# Patient Record
Sex: Female | Born: 1975 | Race: White | Hispanic: No | Marital: Single | Smoking: Former smoker
Health system: Southern US, Community
[De-identification: ages and names within clinical notes are randomized; demographics above are authoritative.]

## PROBLEM LIST (undated history)

## (undated) DIAGNOSIS — J45909 Unspecified asthma, uncomplicated: Secondary | ICD-10-CM

## (undated) DIAGNOSIS — J309 Allergic rhinitis, unspecified: Secondary | ICD-10-CM

## (undated) DIAGNOSIS — F329 Major depressive disorder, single episode, unspecified: Secondary | ICD-10-CM

## (undated) DIAGNOSIS — F988 Other specified behavioral and emotional disorders with onset usually occurring in childhood and adolescence: Secondary | ICD-10-CM

## (undated) DIAGNOSIS — D649 Anemia, unspecified: Secondary | ICD-10-CM

## (undated) DIAGNOSIS — R1900 Intra-abdominal and pelvic swelling, mass and lump, unspecified site: Secondary | ICD-10-CM

## (undated) DIAGNOSIS — E785 Hyperlipidemia, unspecified: Secondary | ICD-10-CM

## (undated) DIAGNOSIS — F32A Depression, unspecified: Secondary | ICD-10-CM

## (undated) DIAGNOSIS — Z9889 Other specified postprocedural states: Secondary | ICD-10-CM

## (undated) DIAGNOSIS — D509 Iron deficiency anemia, unspecified: Secondary | ICD-10-CM

## (undated) DIAGNOSIS — J4530 Mild persistent asthma, uncomplicated: Secondary | ICD-10-CM

## (undated) DIAGNOSIS — F411 Generalized anxiety disorder: Secondary | ICD-10-CM

## (undated) DIAGNOSIS — D126 Benign neoplasm of colon, unspecified: Secondary | ICD-10-CM

## (undated) DIAGNOSIS — R102 Pelvic and perineal pain unspecified side: Secondary | ICD-10-CM

## (undated) DIAGNOSIS — D259 Leiomyoma of uterus, unspecified: Secondary | ICD-10-CM

## (undated) DIAGNOSIS — Z8659 Personal history of other mental and behavioral disorders: Secondary | ICD-10-CM

## (undated) DIAGNOSIS — R112 Nausea with vomiting, unspecified: Secondary | ICD-10-CM

## (undated) DIAGNOSIS — K449 Diaphragmatic hernia without obstruction or gangrene: Secondary | ICD-10-CM

## (undated) DIAGNOSIS — M1990 Unspecified osteoarthritis, unspecified site: Secondary | ICD-10-CM

## (undated) DIAGNOSIS — F419 Anxiety disorder, unspecified: Secondary | ICD-10-CM

## (undated) DIAGNOSIS — K219 Gastro-esophageal reflux disease without esophagitis: Secondary | ICD-10-CM

## (undated) DIAGNOSIS — T7840XA Allergy, unspecified, initial encounter: Secondary | ICD-10-CM

## (undated) DIAGNOSIS — N939 Abnormal uterine and vaginal bleeding, unspecified: Secondary | ICD-10-CM

## (undated) DIAGNOSIS — N83201 Unspecified ovarian cyst, right side: Secondary | ICD-10-CM

## (undated) DIAGNOSIS — Z973 Presence of spectacles and contact lenses: Secondary | ICD-10-CM

## (undated) HISTORY — DX: Anemia, unspecified: D64.9

## (undated) HISTORY — PX: TONSILLECTOMY: SUR1361

## (undated) HISTORY — DX: Iron deficiency anemia, unspecified: D50.9

## (undated) HISTORY — DX: Allergy, unspecified, initial encounter: T78.40XA

## (undated) HISTORY — DX: Unspecified asthma, uncomplicated: J45.909

## (undated) HISTORY — DX: Benign neoplasm of colon, unspecified: D12.6

## (undated) HISTORY — DX: Gastro-esophageal reflux disease without esophagitis: K21.9

## (undated) HISTORY — DX: Nausea with vomiting, unspecified: R11.2

## (undated) HISTORY — DX: Other specified behavioral and emotional disorders with onset usually occurring in childhood and adolescence: F98.8

## (undated) HISTORY — PX: BREAST REDUCTION SURGERY: SHX8

## (undated) HISTORY — DX: Depression, unspecified: F32.A

## (undated) HISTORY — PX: EYE SURGERY: SHX253

## (undated) HISTORY — DX: Other specified postprocedural states: Z98.890

## (undated) HISTORY — DX: Allergic rhinitis, unspecified: J30.9

## (undated) HISTORY — PX: LEEP: SHX91

## (undated) HISTORY — DX: Anxiety disorder, unspecified: F41.9

## (undated) HISTORY — PX: REDUCTION MAMMAPLASTY: SUR839

---
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
## 1994-05-16 HISTORY — PX: TONSILLECTOMY: SUR1361

## 1995-05-17 HISTORY — PX: CERVICAL BIOPSY  W/ LOOP ELECTRODE EXCISION: SUR135

## 2002-09-03 ENCOUNTER — Other Ambulatory Visit: Admission: RE | Admit: 2002-09-03 | Discharge: 2002-09-03 | Payer: Self-pay | Admitting: *Deleted

## 2003-12-12 ENCOUNTER — Emergency Department (HOSPITAL_COMMUNITY): Admission: EM | Admit: 2003-12-12 | Discharge: 2003-12-12 | Payer: Self-pay | Admitting: Family Medicine

## 2004-01-06 ENCOUNTER — Encounter: Admission: RE | Admit: 2004-01-06 | Discharge: 2004-01-06 | Payer: Self-pay | Admitting: Internal Medicine

## 2004-06-09 ENCOUNTER — Ambulatory Visit: Payer: Self-pay | Admitting: Internal Medicine

## 2004-07-05 ENCOUNTER — Ambulatory Visit: Payer: Self-pay | Admitting: Internal Medicine

## 2004-07-19 ENCOUNTER — Ambulatory Visit: Payer: Self-pay | Admitting: Internal Medicine

## 2004-09-09 ENCOUNTER — Ambulatory Visit: Payer: Self-pay | Admitting: Internal Medicine

## 2004-12-09 ENCOUNTER — Ambulatory Visit: Payer: Self-pay | Admitting: Internal Medicine

## 2005-02-10 ENCOUNTER — Ambulatory Visit: Payer: Self-pay | Admitting: Internal Medicine

## 2005-03-17 ENCOUNTER — Ambulatory Visit: Payer: Self-pay | Admitting: Internal Medicine

## 2005-04-01 ENCOUNTER — Ambulatory Visit: Payer: Self-pay | Admitting: Internal Medicine

## 2005-06-07 ENCOUNTER — Ambulatory Visit: Payer: Self-pay | Admitting: Internal Medicine

## 2005-08-15 ENCOUNTER — Ambulatory Visit: Payer: Self-pay | Admitting: Internal Medicine

## 2005-09-15 ENCOUNTER — Ambulatory Visit: Payer: Self-pay | Admitting: Internal Medicine

## 2005-12-14 ENCOUNTER — Ambulatory Visit: Payer: Self-pay | Admitting: Internal Medicine

## 2006-02-17 ENCOUNTER — Ambulatory Visit: Payer: Self-pay | Admitting: Internal Medicine

## 2006-04-21 ENCOUNTER — Ambulatory Visit: Payer: Self-pay | Admitting: Internal Medicine

## 2006-05-24 ENCOUNTER — Ambulatory Visit: Payer: Self-pay | Admitting: Internal Medicine

## 2006-08-31 ENCOUNTER — Ambulatory Visit: Payer: Self-pay | Admitting: Internal Medicine

## 2006-10-23 ENCOUNTER — Ambulatory Visit: Payer: Self-pay | Admitting: Internal Medicine

## 2006-12-27 ENCOUNTER — Telehealth (INDEPENDENT_AMBULATORY_CARE_PROVIDER_SITE_OTHER): Payer: Self-pay | Admitting: *Deleted

## 2006-12-29 ENCOUNTER — Telehealth: Payer: Self-pay | Admitting: Internal Medicine

## 2007-02-01 ENCOUNTER — Ambulatory Visit: Payer: Self-pay | Admitting: Internal Medicine

## 2007-02-01 LAB — CONVERTED CEMR LAB: pH: 5.5

## 2007-02-02 DIAGNOSIS — J309 Allergic rhinitis, unspecified: Secondary | ICD-10-CM

## 2007-02-02 DIAGNOSIS — J45909 Unspecified asthma, uncomplicated: Secondary | ICD-10-CM

## 2007-02-02 HISTORY — DX: Allergic rhinitis, unspecified: J30.9

## 2007-02-02 HISTORY — DX: Unspecified asthma, uncomplicated: J45.909

## 2007-02-13 ENCOUNTER — Ambulatory Visit: Payer: Self-pay | Admitting: Internal Medicine

## 2007-03-12 ENCOUNTER — Ambulatory Visit: Payer: Self-pay | Admitting: Internal Medicine

## 2007-03-30 ENCOUNTER — Telehealth: Payer: Self-pay | Admitting: Internal Medicine

## 2007-04-24 ENCOUNTER — Ambulatory Visit: Payer: Self-pay | Admitting: Internal Medicine

## 2007-04-24 LAB — CONVERTED CEMR LAB: Rapid Strep: NEGATIVE

## 2007-04-30 ENCOUNTER — Telehealth: Payer: Self-pay | Admitting: Internal Medicine

## 2007-05-14 ENCOUNTER — Ambulatory Visit: Payer: Self-pay | Admitting: Internal Medicine

## 2007-05-14 DIAGNOSIS — K219 Gastro-esophageal reflux disease without esophagitis: Secondary | ICD-10-CM

## 2007-05-14 DIAGNOSIS — F988 Other specified behavioral and emotional disorders with onset usually occurring in childhood and adolescence: Secondary | ICD-10-CM

## 2007-05-14 HISTORY — DX: Other specified behavioral and emotional disorders with onset usually occurring in childhood and adolescence: F98.8

## 2007-05-14 HISTORY — DX: Gastro-esophageal reflux disease without esophagitis: K21.9

## 2007-07-02 ENCOUNTER — Telehealth: Payer: Self-pay | Admitting: Internal Medicine

## 2007-07-12 ENCOUNTER — Telehealth: Payer: Self-pay | Admitting: Internal Medicine

## 2007-07-13 ENCOUNTER — Ambulatory Visit: Payer: Self-pay | Admitting: Internal Medicine

## 2007-07-19 ENCOUNTER — Ambulatory Visit: Payer: Self-pay | Admitting: Internal Medicine

## 2007-08-30 ENCOUNTER — Ambulatory Visit: Payer: Self-pay | Admitting: Internal Medicine

## 2007-09-11 ENCOUNTER — Telehealth: Payer: Self-pay | Admitting: Internal Medicine

## 2007-10-15 ENCOUNTER — Telehealth: Payer: Self-pay | Admitting: *Deleted

## 2007-11-29 ENCOUNTER — Ambulatory Visit: Payer: Self-pay | Admitting: Internal Medicine

## 2007-12-31 ENCOUNTER — Ambulatory Visit: Payer: Self-pay | Admitting: Internal Medicine

## 2007-12-31 ENCOUNTER — Telehealth: Payer: Self-pay | Admitting: Internal Medicine

## 2008-01-05 ENCOUNTER — Ambulatory Visit: Payer: Self-pay | Admitting: Family Medicine

## 2008-01-22 ENCOUNTER — Telehealth: Payer: Self-pay | Admitting: Internal Medicine

## 2008-02-06 ENCOUNTER — Ambulatory Visit: Payer: Self-pay | Admitting: Internal Medicine

## 2008-02-14 ENCOUNTER — Ambulatory Visit: Payer: Self-pay | Admitting: Internal Medicine

## 2008-03-13 ENCOUNTER — Ambulatory Visit: Payer: Self-pay | Admitting: Internal Medicine

## 2008-03-18 ENCOUNTER — Telehealth: Payer: Self-pay | Admitting: Internal Medicine

## 2008-03-19 ENCOUNTER — Telehealth: Payer: Self-pay | Admitting: Internal Medicine

## 2008-03-24 ENCOUNTER — Telehealth: Payer: Self-pay | Admitting: Internal Medicine

## 2008-04-28 ENCOUNTER — Telehealth: Payer: Self-pay | Admitting: Internal Medicine

## 2008-05-02 ENCOUNTER — Ambulatory Visit: Payer: Self-pay | Admitting: Internal Medicine

## 2008-05-15 ENCOUNTER — Telehealth: Payer: Self-pay | Admitting: *Deleted

## 2008-06-04 ENCOUNTER — Telehealth: Payer: Self-pay | Admitting: Internal Medicine

## 2008-07-04 ENCOUNTER — Ambulatory Visit: Payer: Self-pay | Admitting: Internal Medicine

## 2008-10-14 ENCOUNTER — Telehealth: Payer: Self-pay | Admitting: Internal Medicine

## 2009-02-10 ENCOUNTER — Telehealth: Payer: Self-pay | Admitting: Internal Medicine

## 2009-02-24 ENCOUNTER — Telehealth: Payer: Self-pay | Admitting: Internal Medicine

## 2009-03-31 ENCOUNTER — Telehealth: Payer: Self-pay | Admitting: Internal Medicine

## 2009-05-15 ENCOUNTER — Ambulatory Visit: Payer: Self-pay | Admitting: Family Medicine

## 2009-06-18 ENCOUNTER — Telehealth: Payer: Self-pay | Admitting: Internal Medicine

## 2009-07-22 ENCOUNTER — Encounter: Admission: RE | Admit: 2009-07-22 | Discharge: 2009-07-22 | Payer: Self-pay | Admitting: Gynecology

## 2009-09-24 ENCOUNTER — Telehealth: Payer: Self-pay | Admitting: Internal Medicine

## 2009-12-11 ENCOUNTER — Telehealth: Payer: Self-pay | Admitting: Internal Medicine

## 2009-12-21 ENCOUNTER — Telehealth: Payer: Self-pay | Admitting: Internal Medicine

## 2010-01-15 ENCOUNTER — Telehealth: Payer: Self-pay | Admitting: Internal Medicine

## 2010-02-01 ENCOUNTER — Encounter: Payer: Self-pay | Admitting: Internal Medicine

## 2010-04-27 ENCOUNTER — Telehealth: Payer: Self-pay | Admitting: Internal Medicine

## 2010-06-03 ENCOUNTER — Encounter: Payer: Self-pay | Admitting: *Deleted

## 2010-06-04 ENCOUNTER — Encounter: Payer: Self-pay | Admitting: Internal Medicine

## 2010-06-04 ENCOUNTER — Ambulatory Visit
Admission: RE | Admit: 2010-06-04 | Discharge: 2010-06-04 | Payer: Self-pay | Source: Home / Self Care | Attending: Internal Medicine | Admitting: Internal Medicine

## 2010-06-10 ENCOUNTER — Telehealth: Payer: Self-pay | Admitting: Internal Medicine

## 2010-06-17 NOTE — Progress Notes (Signed)
Summary: REQ FOR REFILL (Ritalin)  Phone Note Refill Request Message from:  Patient on Sep 24, 2009 4:44 PM  Refills Requested: Medication #1:  RITALIN SR 20 MG  TBCR one by mouth q AM   Notes: Pt can be reached at [phone] when Rx is ready for p/u.  Medication #2:  RITALIN 10 MG  TABS one by mouth at noon   Notes: Pt can be reached at [phone] when Rx is ready for p/u.    Initial call taken by: Debbra Riding,  Sep 24, 2009 4:45 PM    Prescriptions: RITALIN 10 MG  TABS (METHYLPHENIDATE HCL) one by mouth at noon  #30 x 0   Entered by:   Willy Eddy, LPN   Authorized by:   Stacie Glaze MD   Signed by:   Willy Eddy, LPN on 29/56/2130   Method used:   Print then Give to Patient   RxID:   [id number] RITALIN SR 20 MG  TBCR (METHYLPHENIDATE HCL) one by mouth q AM  #30 x 0   Entered by:   Willy Eddy, LPN   Authorized by:   Stacie Glaze MD   Signed by:   Willy Eddy, LPN on 32/44/0102   Method used:   Print then Give to Patient   RxID:   [id number] RITALIN 10 MG  TABS (METHYLPHENIDATE HCL) one by mouth at noon  #30 x 0   Entered by:   Willy Eddy, LPN   Authorized by:   Stacie Glaze MD   Signed by:   Willy Eddy, LPN on 56/38/7564   Method used:   Print then Give to Patient   RxID:   [id number] RITALIN SR 20 MG  TBCR (METHYLPHENIDATE HCL) one by mouth q AM  #30 x 0   Entered by:   Willy Eddy, LPN   Authorized by:   Stacie Glaze MD   Signed by:   Willy Eddy, LPN on 16/05/930   Method used:   Print then Give to Patient   RxID:   [id number] RITALIN SR 20 MG  TBCR (METHYLPHENIDATE HCL) one by mouth q AM  #30 x 0   Entered by:   Willy Eddy, LPN   Authorized by:   Stacie Glaze MD   Signed by:   Willy Eddy, LPN on 37/62/8315   Method used:   Print then Give to Patient   RxID:   [id number] RITALIN 10 MG  TABS (METHYLPHENIDATE HCL) one by mouth at noon  #30 x 0   Entered  by:   Willy Eddy, LPN   Authorized by:   Stacie Glaze MD   Signed by:   Willy Eddy, LPN on 85/46/2703   Method used:   Print then Give to Patient   RxID:   [id number]

## 2010-06-17 NOTE — Medication Information (Signed)
Summary: Methylphenidate Hcl and Methylin Approved  Methylphenidate Hcl and Methylin Approved   Imported By: Maryln Gottron 02/09/2010 09:58:02  _____________________________________________________________________  External Attachment:    Type:   Image     Comment:   External Document

## 2010-06-17 NOTE — Medication Information (Signed)
Summary: Methylin ER Approved  Methylin ER Approved   Imported By: Maryln Gottron 02/08/2010 11:11:45  _____________________________________________________________________  External Attachment:    Type:   Image     Comment:   External Document

## 2010-06-17 NOTE — Progress Notes (Signed)
Summary: REFILL REQUEST  Phone Note Refill Request Message from:  Patient on April 27, 2010 4:06 PM  Refills Requested: Medication #1:  RITALIN SR 20 MG  TBCR one by mouth q AM   Notes: Pt can be reached at [phone] when Rx is ready for p/u.  Medication #2:  RITALIN 10 MG  TABS one by mouth at noon   Notes: Pt can be reached at [phone] when Rx is ready for p/u.    Initial call taken by: Debbra Riding,  April 27, 2010 4:07 PM    Prescriptions: RITALIN 10 MG  TABS (METHYLPHENIDATE HCL) one by mouth at noon  #30 x 0   Entered by:   Willy Eddy, LPN   Authorized by:   Stacie Glaze MD   Signed by:   Willy Eddy, LPN on 29/56/2130   Method used:   Print then Give to Patient   RxID:   [id number] RITALIN SR 20 MG  TBCR (METHYLPHENIDATE HCL) one by mouth q AM  #30 x 0   Entered by:   Willy Eddy, LPN   Authorized by:   Stacie Glaze MD   Signed by:   Willy Eddy, LPN on 32/44/0102   Method used:   Print then Give to Patient   RxID:   [id number] RITALIN 10 MG  TABS (METHYLPHENIDATE HCL) one by mouth at noon  #30 x 0   Entered by:   Willy Eddy, LPN   Authorized by:   Stacie Glaze MD   Signed by:   Willy Eddy, LPN on 56/38/7564   Method used:   Print then Give to Patient   RxID:   [id number] RITALIN SR 20 MG  TBCR (METHYLPHENIDATE HCL) one by mouth q AM  #30 x 0   Entered by:   Willy Eddy, LPN   Authorized by:   Stacie Glaze MD   Signed by:   Willy Eddy, LPN on 16/05/930   Method used:   Print then Give to Patient   RxID:   [id number] RITALIN 10 MG  TABS (METHYLPHENIDATE HCL) one by mouth at noon  #30 x 0   Entered by:   Willy Eddy, LPN   Authorized by:   Stacie Glaze MD   Signed by:   Willy Eddy, LPN on 37/62/8315   Method used:   Print then Give to Patient   RxID:   [id number] RITALIN SR 20 MG  TBCR (METHYLPHENIDATE HCL) one by mouth q AM  #30 x 0   Entered  by:   Willy Eddy, LPN   Authorized by:   Stacie Glaze MD   Signed by:   Willy Eddy, LPN on 85/46/2703   Method used:   Print then Give to Patient   RxID:   [id number]

## 2010-06-17 NOTE — Progress Notes (Signed)
Summary: Pt req scripts for Ritalin SR 20mg  and Ritalin 10mg   Phone Note Refill Request Call back at Home Phone [phone] Message from:  Patient on January 15, 2010 3:47 PM  Refills Requested: Medication #1:  RITALIN SR 20 MG  TBCR one by mouth q AM   Dosage confirmed as above?Dosage Confirmed   Supply Requested: 3 months  Medication #2:  RITALIN 10 MG  TABS one by mouth at noon   Dosage confirmed as above?Dosage Confirmed   Supply Requested: 3 months Pt is wanting to know if her brother, Amber Elliott, can pick up script when ready, or does she have to do it?    Method Requested: Pick up at Office Initial call taken by: Lucy Antigua,  January 15, 2010 3:47 PM    Prescriptions: RITALIN 10 MG  TABS (METHYLPHENIDATE HCL) one by mouth at noon  #30 x 0   Entered by:   Willy Eddy, LPN   Authorized by:   Stacie Glaze MD   Signed by:   Willy Eddy, LPN on 09/81/1914   Method used:   Print then Give to Patient   RxID:   [id number] RITALIN 10 MG  TABS (METHYLPHENIDATE HCL) one by mouth at noon  #30 x 0   Entered by:   Willy Eddy, LPN   Authorized by:   Stacie Glaze MD   Signed by:   Willy Eddy, LPN on 69/62/9528   Method used:   Print then Give to Patient   RxID:   [id number] RITALIN 10 MG  TABS (METHYLPHENIDATE HCL) one by mouth at noon  #30 x 0   Entered by:   Willy Eddy, LPN   Authorized by:   Stacie Glaze MD   Signed by:   Willy Eddy, LPN on 44/07/4740   Method used:   Print then Give to Patient   RxID:   [id number]

## 2010-06-17 NOTE — Progress Notes (Signed)
Summary: new rx  Phone Note Call from Patient Call back at Home Phone [phone]   Caller: Patient Call For: Amber Glaze MD Summary of Call: pt needs ritalin sr 20 mg and ritalin 10 mg, please call when ready for pick up Initial call taken by: Heron Sabins,  June 18, 2009 2:57 PM    Prescriptions: RITALIN 10 MG  TABS (METHYLPHENIDATE HCL) one by mouth at noon  #30 x 0   Entered by:   Willy Eddy, LPN   Authorized by:   Amber Glaze MD   Signed by:   Willy Eddy, LPN on 09/81/1914   Method used:   Print then Give to Patient   RxID:   [id number] RITALIN SR 20 MG  TBCR (METHYLPHENIDATE HCL) one by mouth q AM  #30 x 0   Entered by:   Willy Eddy, LPN   Authorized by:   Amber Glaze MD   Signed by:   Willy Eddy, LPN on 69/62/9528   Method used:   Print then Give to Patient   RxID:   [id number] RITALIN 10 MG  TABS (METHYLPHENIDATE HCL) one by mouth at noon  #30 x 0   Entered by:   Willy Eddy, LPN   Authorized by:   Amber Glaze MD   Signed by:   Willy Eddy, LPN on 44/07/4740   Method used:   Print then Give to Patient   RxID:   [id number] RITALIN SR 20 MG  TBCR (METHYLPHENIDATE HCL) one by mouth q AM  #30 x 0   Entered by:   Willy Eddy, LPN   Authorized by:   Amber Glaze MD   Signed by:   Willy Eddy, LPN on 88/41/6606   Method used:   Print then Give to Patient   RxID:   [id number] RITALIN 10 MG  TABS (METHYLPHENIDATE HCL) one by mouth at noon  #30 x 0   Entered by:   Willy Eddy, LPN   Authorized by:   Amber Glaze MD   Signed by:   Willy Eddy, LPN on 20/25/4270   Method used:   Print then Give to Patient   RxID:   [id number] RITALIN SR 20 MG  TBCR (METHYLPHENIDATE HCL) one by mouth q AM  #30 x 0   Entered by:   Willy Eddy, LPN   Authorized by:   Amber Glaze MD   Signed by:   Willy Eddy, LPN on 73/71/0626   Method used:   Print then  Give to Patient   RxID:   [id number]

## 2010-06-17 NOTE — Progress Notes (Signed)
Summary: sinus infection  Phone Note Call from Patient   Caller: Patient Call For: Stacie Glaze MD Summary of Call: Pt is complaining of congestion with red eyes, and itchy.   Pain in face, eyes and teeth.  No fever, minimal cough.  Feels like a sinus infection. [facility] ([facility] No allergies. [phone] Initial call taken by: Lynann Beaver CMA,  December 21, 2009 8:41 AM  Follow-up for Phone Call        augmentin 875 two times a day for 10 days allegra d is OTC Follow-up by: Stacie Glaze MD,  December 21, 2009 9:42 AM    New/Updated Medications: AUGMENTIN 875-125 MG TABS (AMOXICILLIN-POT CLAVULANATE) one by mouth  two times a day x 7 days ALLEGRA-D 12 HOUR 60-120 MG XR12H-TAB (FEXOFENADINE-PSEUDOEPHEDRINE) one by mouth two times a day Prescriptions: ALLEGRA-D 12 HOUR 60-120 MG XR12H-TAB (FEXOFENADINE-PSEUDOEPHEDRINE) one by mouth two times a day  #20 x 0   Entered by:   Lynann Beaver CMA   Authorized by:   Stacie Glaze MD   Signed by:   Lynann Beaver CMA on 12/21/2009   Method used:   Faxed to ...       [facility] (retail)       [phone] [address]       [place], [place]  [address]       Ph: [phone]       Fax: [phone]   RxID:   [id number] AUGMENTIN 875-125 MG TABS (AMOXICILLIN-POT CLAVULANATE) one by mouth  two times a day x 7 days  #14 x 0   Entered by:   Lynann Beaver CMA   Authorized by:   Stacie Glaze MD   Signed by:   Lynann Beaver CMA on 12/21/2009   Method used:   Faxed to ...       [facility] (retail)       [phone] [address]       [place], [place]  [address]       Ph: [phone]       Fax: [phone]   RxID:   [id number] ALLEGRA-D 12 HOUR 60-120 MG XR12H-TAB (FEXOFENADINE-PSEUDOEPHEDRINE) one by mouth two times a day  #20 x 0   Entered by:   Lynann Beaver CMA   Authorized by:   Stacie Glaze MD   Signed by:   Lynann Beaver CMA on 12/21/2009   Method used:   Faxed to ...       [facility] (retail)  [phone] [address]       [place], [place]  [address]       Ph: [phone]       Fax: [phone]   RxID:   [id number] AUGMENTIN 875-125 MG TABS (AMOXICILLIN-POT CLAVULANATE) one by mouth  two times a day x 7 days  #14 x 0   Entered by:   Lynann Beaver CMA   Authorized by:   Stacie Glaze MD   Signed by:   Lynann Beaver CMA on 12/21/2009   Method used:   Print then Give to Patient   RxID:   [id number]

## 2010-06-17 NOTE — Progress Notes (Signed)
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    Initial call taken by: Debbra Riding,  December 11, 2009 10:26 AM    Prescriptions: PROAIR HFA 108 (90 BASE) MCG/ACT  AERS (ALBUTEROL SULFATE) Use as directed  #1 x 1   Entered by:   Lynann Beaver CMA   Authorized by:   Stacie Glaze MD   Signed by:   Lynann Beaver CMA on 12/11/2009   Method used:   Electronically to        [facility] [address] [id number][facility] (retail)       [address]       [place], [place]  [address]       Ph: [phone] or [phone]       Fax: [phone]   RxID:   [id number]

## 2010-06-17 NOTE — Progress Notes (Signed)
Summary: BLOODWORK RESULTS  Phone Note Call from Patient Call back at Home Phone [phone]   Caller: Patient Call For: Stacie Glaze MD Reason for Call: Acute Illness Summary of Call: PT WOULD LIKE LAB RESULTS Initial call taken by: Heron Sabins,  June 10, 2010 4:22 PM  Follow-up for Phone Call        left message on machine call an d schedule injection Follow-up by: Willy Eddy, LPN,  June 10, 2010 5:29 PM

## 2010-06-21 ENCOUNTER — Telehealth: Payer: Self-pay | Admitting: Internal Medicine

## 2010-06-21 NOTE — Telephone Encounter (Signed)
Pt called and says that she has misplaced her written script for Ritalin LA 20mg  and Ritalin 10mg . Pt is req new scripts to be written. Pls call.

## 2010-06-22 ENCOUNTER — Other Ambulatory Visit: Payer: Self-pay | Admitting: Internal Medicine

## 2010-06-22 DIAGNOSIS — F988 Other specified behavioral and emotional disorders with onset usually occurring in childhood and adolescence: Secondary | ICD-10-CM

## 2010-06-22 MED ORDER — METHYLPHENIDATE HCL 20 MG PO TBCR: 20.0000 mg | EXTENDED_RELEASE_TABLET | ORAL | Status: DC

## 2010-06-22 MED ORDER — METHYLPHENIDATE HCL 10 MG PO TABS: 10.0000 mg | ORAL_TABLET | Freq: Every day | ORAL | Status: DC

## 2010-07-01 NOTE — Assessment & Plan Note (Signed)
Summary: [facility]//CCM   Vital Signs:  Patient profile:   35 year old female Height:      63 inches Weight:      172 pounds BMI:     30.58 Temp:     98.2 degrees F oral Pulse rate:   76 / minute Resp:     14 per minute BP sitting:   110 / 70  (left arm)  Vitals Entered By: Willy Eddy, LPN (June 04, 2010 4:07 PM) CC: roa Is Patient Diabetic? No   Primary Care Provider:  Stacie Glaze MD  CC:  roa.  History of Present Illness: has been healthy  and has  been able to ween off meds  Asthma History    Asthma Control Assessment:    Age range: 12+ years    Symptoms: 0-2 days/week    Nighttime Awakenings: 0-2/month    Interferes w/ normal activity: no limitations    SABA use (not for EIB): 0-2 days/week    ATAQ questionnaire: 0    Asthma Control Assessment: Well Controlled    Preventive Screening-Counseling & Management  Alcohol-Tobacco     Smoking Status: quit     Passive Smoke Exposure: no  Problems Prior to Update: 1)  Contact or Exposure To Varicella  (ICD-V01.71) 2)  Tendinitis, Left Thumb  (ICD-727.05) 3)  Conjunctivitis  (ICD-372.30) 4)  Acute Maxillary Sinusitis  (ICD-461.0) 5)  Attention Deficit Disorder, Adult  (ICD-314.00) 6)  Gerd  (ICD-530.81) 7)  Allergic Rhinitis  (ICD-477.9) 8)  Asthma  (ICD-493.90) 9)  Cystitis, Acute  (ICD-595.0)  Current Problems (verified): 1)  Tendinitis, Left Thumb  (ICD-727.05) 2)  Conjunctivitis  (ICD-372.30) 3)  Acute Maxillary Sinusitis  (ICD-461.0) 4)  Attention Deficit Disorder, Adult  (ICD-314.00) 5)  Gerd  (ICD-530.81) 6)  Allergic Rhinitis  (ICD-477.9) 7)  Asthma  (ICD-493.90) 8)  Cystitis, Acute  (ICD-595.0)  Medications Prior to Update: 1)  Zyrtec Allergy 10 Mg  Tabs (Cetirizine Hcl) .... Once Daily 2)  Nasonex 50 Mcg/act  Susp (Mometasone Furoate) .... As Needed 3)  Prilosec 40 Mg  Cpdr (Omeprazole) .... Once Daily As Needed 4)  Allergy Injections .... Q 2 Weeks 5)  Multivitamins   Tabs  (Multiple Vitamin) .... Once Daily 6)  Calcium Citrate 250 Mg  Tabs (Calcium Citrate) .... Once Daily 7)  Symbicort 80-4.5 Mcg/act  Aero (Budesonide-Formoterol Fumarate) .... Two Puff By Mouth Bid 8)  Ritalin Sr 20 Mg  Tbcr (Methylphenidate Hcl) .... One By Mouth Q Am 9)  Ritalin 10 Mg  Tabs (Methylphenidate Hcl) .... One By Mouth At [facility] 10)  Proair Hfa 108 [phone] Base) Mcg/act  Aers (Albuterol Sulfate) .... Use As Directed 11)  Tamiflu 75 Mg Caps (Oseltamivir Phosphate) .Marland Kitchen.. 1 Two Times A Day 12)  Hycodan Syr .... One Tsp Every Six Hours Prn 13)  Biaxin 500 Mg Tabs (Clarithromycin) .... One By Mouth Two Times A Day X 10 Days. 14)  Ceftin 250 Mg Tabs (Cefuroxime Axetil) .... One Two Times A Day For 10 Days 15)  Hydromet 5-1.5 Mg/43ml Syrp (Hydrocodone-Homatropine) .... One Teaspoon Every 8 Hr As Needed Cough 16)  Zithromax Z-Pak 250 Mg Tabs (Azithromycin) .... As Directed 17)  Atuss Ds 30-4-30 Mg/58ml Susp (Pseudoephed Hcl-Cpm-Dm Hbr Tan) .... 2 Teaspoons Q 12 Hours 18)  Augmentin 875-125 Mg Tabs (Amoxicillin-Pot Clavulanate) .... One By Mouth  Two Times A Day X 7 Days 19)  Allegra-D 12 Hour 60-120 Mg Xr12h-Tab (Fexofenadine-Pseudoephedrine) .... One By Mouth Two  Times A Day  Current Medications (verified): 1)  Zyrtec Allergy 10 Mg  Tabs (Cetirizine Hcl) .... Once Daily 2)  Multivitamins   Tabs (Multiple Vitamin) .... Once Daily 3)  Calcium Citrate 250 Mg  Tabs (Calcium Citrate) .... Once Daily 4)  Ritalin Sr 20 Mg  Tbcr (Methylphenidate Hcl) .... One By Mouth Q Am 5)  Ritalin 10 Mg  Tabs (Methylphenidate Hcl) .... One By Mouth At [facility] 6)  Proair Hfa 108 [phone] Base) Mcg/act  Aers (Albuterol Sulfate) .... Use As Directed  Allergies (verified): No Known Drug Allergies  Past History:  Family History: Last updated: 05/14/2007 mother alive and well father died of stomach cancer  Social History: Last updated: 03/12/2007 Single Former Smoker  Risk Factors: Smoking Status: quit  (06/04/2010) Passive Smoke Exposure: no (06/04/2010)  Past medical, surgical, family and social histories (including risk factors) reviewed, and no changes noted (except as noted below).  Past Medical History: Reviewed history from 05/14/2007 and no changes required. Asthma ADHD Allergic rhinitis GERD  Past Surgical History: Reviewed history from 03/12/2007 and no changes required. Tonsillectomy breast reduction Leap procedure theraputic abortion  Family History: Reviewed history from 05/14/2007 and no changes required. mother alive and well father died of stomach cancer  Social History: Reviewed history from 03/12/2007 and no changes required. Single Former Smoker  Review of Systems  The patient denies anorexia, fever, weight loss, weight gain, vision loss, decreased hearing, hoarseness, chest pain, syncope, dyspnea on exertion, peripheral edema, prolonged cough, headaches, hemoptysis, abdominal pain, melena, hematochezia, severe indigestion/heartburn, hematuria, incontinence, genital sores, muscle weakness, suspicious skin lesions, transient blindness, difficulty walking, depression, unusual weight change, abnormal bleeding, enlarged lymph nodes, angioedema, and breast masses.    Physical Exam  General:  Well-developed,well-nourished,in no acute distress; alert,appropriate and cooperative throughout examination Ears:  R ear normal and L ear normal.   Nose:  no external deformity and no nasal discharge.   Mouth:  Oral mucosa and oropharynx without lesions or exudates.  Teeth in good repair.   Impression & Recommendations:  Problem # 1:  ATTENTION DEFICIT DISORDER, ADULT (ICD-314.00) up to datte with RX and will be due n march  Problem # 2:  Preventive Health Care (ICD-V70.0) order flu and  DPT Flu Vax: Fluvax 3+ (02/14/2008)   Pneumovax: Pneumovax (02/06/2008)  Discussed using sunscreen, use of alcohol, drug use, self breast exam, routine dental care, routine eye  care, schedule for GYN exam, routine physical exam, seat belts, multiple vitamins, osteoporosis prevention, adequate calcium intake in diet, recommendations for immunizations, mammograms and Pap smears.  Discussed exercise and checking cholesterol.  Discussed gun safety, safe sex, and contraception.  Problem # 3:  ASTHMA (ICD-493.90) Assessment: Improved  The following medications were removed from the medication list:    Symbicort 80-4.5 Mcg/act Aero (Budesonide-formoterol fumarate) .Marland Kitchen..Marland Kitchen Two puff by mouth bid Her updated medication list for this problem includes:    Proair Hfa 108 (90 Base) Mcg/act Aers (Albuterol sulfate) ..... Use as directed  Pulmonary Functions Reviewed: O2 sat: 98 (05/15/2009)  Current Asthma Management Plan:  Green Zone:  PROAIR HFA 108 (90 BASE) MCG/ACT  AERS:  2 puffs every 4 hours as needed Red:  PROAIR HFA 108 (90 BASE) MCG/ACT  AERS Call your physician for shortness of breath.    Problem # 4:  GERD (ICD-530.81)  The following medications were removed from the medication list:    Prilosec 40 Mg Cpdr (Omeprazole) ..... Once daily as needed  Complete Medication List: 1)  Zyrtec  Allergy 10 Mg Tabs (Cetirizine hcl) .... Once daily 2)  Multivitamins Tabs (Multiple vitamin) .... Once daily 3)  Calcium Citrate 250 Mg Tabs (Calcium citrate) .... Once daily 4)  Ritalin Sr 20 Mg Tbcr (Methylphenidate hcl) .... One by mouth q am 5)  Ritalin 10 Mg Tabs (Methylphenidate hcl) .... One by mouth at noon 6)  Proair Hfa 108 (90 Base) Mcg/act Aers (Albuterol sulfate) .... Use as directed  Other Orders: T- * Misc. Laboratory test [phone]) Specimen Handling (60454) Tdap => 45yrs IM (09811) Admin 1st Vaccine (91478)  Asthma Management Plan    Asthma Severity: Intermittent    Control Assessment: Well Controlled    Plan based on PEF formula: [place] and Dinah Beers Zone:PROAIR HFA 108 (90 BASE) MCG/ACT  AERS:  2 puffs every 4 hours as needed  Red Zone: PROAIR HFA  108 (90 BASE) MCG/ACT  AERS Call your physician for shortness of breath.     Patient Instructions: 1)  Please schedule a follow-up appointment in 3 months.   Orders Added: 1)  T- * Misc. Laboratory test [99999] 2)  Est. Patient Level IV [29562] 3)  Specimen Handling [99000] 4)  Tdap => 10yrs IM [90715] 5)  Admin 1st Vaccine [13086]   Immunizations Administered:  Tetanus Vaccine:    Vaccine Type: Tdap    Site: left deltoid    Mfr: GlaxoSmithKline    Dose: 0.5 ml    Route: IM    Given by: Willy Eddy, LPN    Exp. Date: 03/04/2012    Lot #: [id number]    VIS given: 04/02/08 version given June 04, 2010.   Immunizations Administered:  Tetanus Vaccine:    Vaccine Type: Tdap    Site: left deltoid    Mfr: GlaxoSmithKline    Dose: 0.5 ml    Route: IM    Given by: Willy Eddy, LPN    Exp. Date: 03/04/2012    Lot #: [id number]    VIS given: 04/02/08 version given June 04, 2010.

## 2010-07-08 ENCOUNTER — Telehealth: Payer: Self-pay | Admitting: Internal Medicine

## 2010-09-09 ENCOUNTER — Telehealth: Payer: Self-pay | Admitting: Internal Medicine

## 2010-09-09 NOTE — Telephone Encounter (Signed)
Refill  Ritalin

## 2010-09-10 ENCOUNTER — Other Ambulatory Visit: Payer: Self-pay | Admitting: *Deleted

## 2010-09-10 DIAGNOSIS — F988 Other specified behavioral and emotional disorders with onset usually occurring in childhood and adolescence: Secondary | ICD-10-CM

## 2010-09-10 MED ORDER — METHYLPHENIDATE HCL 10 MG PO TABS: 10.0000 mg | ORAL_TABLET | Freq: Every day | ORAL | Status: DC

## 2010-09-10 MED ORDER — METHYLPHENIDATE HCL 20 MG PO TBCR: 20.0000 mg | EXTENDED_RELEASE_TABLET | ORAL | Status: DC

## 2010-09-10 NOTE — Telephone Encounter (Signed)
Left message on machine Ready for pick up 

## 2010-11-29 ENCOUNTER — Other Ambulatory Visit: Payer: Self-pay | Admitting: Internal Medicine

## 2010-11-29 ENCOUNTER — Telehealth: Payer: Self-pay | Admitting: *Deleted

## 2010-11-29 MED ORDER — AZITHROMYCIN 250 MG PO TABS: ORAL_TABLET | ORAL | Status: AC

## 2010-11-29 NOTE — Telephone Encounter (Signed)
Per dr jenkins-  May have z pack  

## 2010-11-29 NOTE — Telephone Encounter (Signed)
Pt is having a h/a, yellow mucus, sinus pressure, x2 wks no fever.  She would like a zpack [employer]

## 2010-11-30 ENCOUNTER — Other Ambulatory Visit: Payer: Self-pay | Admitting: *Deleted

## 2010-11-30 MED ORDER — ALBUTEROL SULFATE HFA 108 (90 BASE) MCG/ACT IN AERS: 2.0000 | INHALATION_SPRAY | Freq: Two times a day (BID) | RESPIRATORY_TRACT | Status: DC | PRN

## 2011-01-10 ENCOUNTER — Telehealth: Payer: Self-pay | Admitting: Internal Medicine

## 2011-01-10 DIAGNOSIS — F988 Other specified behavioral and emotional disorders with onset usually occurring in childhood and adolescence: Secondary | ICD-10-CM

## 2011-01-10 MED ORDER — METHYLPHENIDATE HCL 10 MG PO TABS: 10.0000 mg | ORAL_TABLET | Freq: Every day | ORAL | Status: DC

## 2011-01-10 MED ORDER — METHYLPHENIDATE HCL 20 MG PO TBCR: 20.0000 mg | EXTENDED_RELEASE_TABLET | ORAL | Status: DC

## 2011-01-10 NOTE — Telephone Encounter (Signed)
Pt requesting refill on methylphenidate (RITALIN) 10 MG tablet

## 2011-01-10 NOTE — Telephone Encounter (Signed)
Pt requesting refill on methylphenidate (RITALIN) 10 MG tablet ° °

## 2011-01-10 NOTE — Telephone Encounter (Signed)
Ready for pick up

## 2011-06-29 ENCOUNTER — Other Ambulatory Visit: Payer: Self-pay | Admitting: Internal Medicine

## 2011-06-29 DIAGNOSIS — F988 Other specified behavioral and emotional disorders with onset usually occurring in childhood and adolescence: Secondary | ICD-10-CM

## 2011-06-29 DIAGNOSIS — F909 Attention-deficit hyperactivity disorder, unspecified type: Secondary | ICD-10-CM

## 2011-06-29 MED ORDER — METHYLPHENIDATE HCL ER 20 MG PO TBCR
20.0000 mg | EXTENDED_RELEASE_TABLET | Freq: Every day | ORAL | Status: DC
Start: 2011-06-29 — End: 2011-08-19

## 2011-06-29 MED ORDER — METHYLPHENIDATE HCL ER 20 MG PO TBCR
20.0000 mg | EXTENDED_RELEASE_TABLET | Freq: Every day | ORAL | Status: DC
Start: 2011-06-29 — End: 2011-06-29

## 2011-06-29 MED ORDER — METHYLPHENIDATE HCL 10 MG PO TABS: 10.0000 mg | ORAL_TABLET | Freq: Every day | ORAL | Status: DC

## 2011-06-29 NOTE — Telephone Encounter (Signed)
Pt needs 2 new rx ritalin 20 mg er and ritalin 10 mg. Please call pt when ready for pick up

## 2011-06-29 NOTE — Telephone Encounter (Signed)
done

## 2011-08-19 ENCOUNTER — Ambulatory Visit (INDEPENDENT_AMBULATORY_CARE_PROVIDER_SITE_OTHER): Payer: BC Managed Care – PPO | Admitting: Internal Medicine

## 2011-08-19 ENCOUNTER — Encounter: Payer: Self-pay | Admitting: Internal Medicine

## 2011-08-19 ENCOUNTER — Other Ambulatory Visit: Payer: Self-pay | Admitting: *Deleted

## 2011-08-19 VITALS — BP 130/80 | HR 72 | Temp 98.6°F | Resp 14 | Ht 61.0 in | Wt 170.0 lb

## 2011-08-19 DIAGNOSIS — F988 Other specified behavioral and emotional disorders with onset usually occurring in childhood and adolescence: Secondary | ICD-10-CM

## 2011-08-19 DIAGNOSIS — F909 Attention-deficit hyperactivity disorder, unspecified type: Secondary | ICD-10-CM

## 2011-08-19 MED ORDER — METHYLPHENIDATE HCL 10 MG PO TABS: 10.0000 mg | ORAL_TABLET | Freq: Every day | ORAL | Status: DC

## 2011-08-19 MED ORDER — METHYLPHENIDATE HCL ER 20 MG PO TBCR: 20.0000 mg | EXTENDED_RELEASE_TABLET | Freq: Every day | ORAL | Status: DC

## 2011-08-19 NOTE — Progress Notes (Signed)
Subjective:    Patient ID: Amber Elliott, female    DOB: July 16, 1975, 36 y.o.   MRN: [id number]  HPI This is a 36 year old white female who presents for followup of history of asthmatic bronchitis history of seasonal rhinitis and a history of adult attention deficit disorder.  She was fully tested when she was an adolescent and had ADD. She was a patient through college required a Adderall to  complete college since then she has assumed a career as an Programmer, systems and has found in her whole in education that she needs the medication continued to have a focus to perform in her career.     Review of Systems  Constitutional: Negative for activity change, appetite change and fatigue.  HENT: Negative for ear pain, congestion, neck pain, postnasal drip and sinus pressure.   Eyes: Negative for redness and visual disturbance.  Respiratory: Negative for cough, shortness of breath and wheezing.   Gastrointestinal: Negative for abdominal pain and abdominal distention.  Genitourinary: Negative for dysuria, frequency and menstrual problem.  Musculoskeletal: Negative for myalgias, joint swelling and arthralgias.  Skin: Negative for rash and wound.  Neurological: Negative for dizziness, weakness and headaches.  Hematological: Negative for adenopathy. Does not bruise/bleed easily.  Psychiatric/Behavioral: Negative for sleep disturbance and decreased concentration.   Past Medical History  Diagnosis Date  . ATTENTION DEFICIT DISORDER, ADULT 05/14/2007  . ALLERGIC RHINITIS 02/02/2007  . ASTHMA 02/02/2007  . GERD 05/14/2007    History   Social History  . Marital Status: Single    Spouse Name: N/A    Number of Children: N/A  . Years of Education: N/A   Occupational History  . Not on file.   Social History Main Topics  . Smoking status: Never Smoker   . Smokeless tobacco: Not on file  . Alcohol Use: Not on file  . Drug Use: Not on file  . Sexually Active: Not on file   Other Topics Concern    . Not on file   Social History Narrative  . No narrative on file    No past surgical history on file.  No family history on file.  Not on File  Current Outpatient Prescriptions on File Prior to Visit  Medication Sig Dispense Refill  . albuterol (PROAIR HFA) 108 (90 BASE) MCG/ACT inhaler Inhale 2 puffs into the lungs 2 (two) times daily as needed for wheezing.  8.5 g  11  . Calcium Citrate Malate-Vit D (CALCIUM + D) 250-100 MG-UNIT TABS Take by mouth daily.        . Multiple Vitamin (MULTIVITAMIN) capsule Take 1 capsule by mouth daily.        Marland Kitchen DISCONTD: methylphenidate (METADATE ER) 20 MG ER tablet Take 1 tablet (20 mg total) by mouth daily.  30 tablet  0  . DISCONTD: methylphenidate (RITALIN) 10 MG tablet Take 1 tablet (10 mg total) by mouth daily. One at noon  30 tablet  0  . DISCONTD: methylphenidate (METADATE ER) 20 MG ER tablet Take 1 tablet (20 mg total) by mouth every morning.  30 tablet  0    BP 130/80  Pulse 72  Temp 98.6 F (37 C)  Resp 14  Ht 5\' 1"  (1.549 m)  Wt 170 lb (77.111 kg)  BMI 32.12 kg/m2       Objective:   Physical Exam  Nursing note and vitals reviewed. Constitutional: She is oriented to person, place, and time. She appears well-developed and well-nourished. No distress.  HENT:  Head: Normocephalic  and atraumatic.  Right Ear: External ear normal.  Left Ear: External ear normal.  Nose: Nose normal.  Mouth/Throat: Oropharynx is clear and moist.  Eyes: Conjunctivae and EOM are normal. Pupils are equal, round, and reactive to light.  Neck: Normal range of motion. Neck supple. No JVD present. No tracheal deviation present. No thyromegaly present.  Cardiovascular: Normal rate, regular rhythm, normal heart sounds and intact distal pulses.   No murmur heard. Pulmonary/Chest: Effort normal and breath sounds normal. She has no wheezes. She exhibits no tenderness.  Abdominal: Soft. Bowel sounds are normal.  Musculoskeletal: Normal range of motion. She  exhibits no edema and no tenderness.  Lymphadenopathy:    She has no cervical adenopathy.  Neurological: She is alert and oriented to person, place, and time. She has normal reflexes. No cranial nerve deficit.  Skin: Skin is warm and dry. She is not diaphoretic.  Psychiatric: She has a normal mood and affect. Her behavior is normal.          Assessment & Plan:  Full 3 months her medications for routine monitoring.  I will give her rescue inhaler for her asthma which appears stable and discussed seasonal allergies since we are approaching the height of the allergy season

## 2011-08-19 NOTE — Patient Instructions (Addendum)
The patient is instructed to continue all medications as prescribed. Schedule followup with check out clerk upon leaving the clinic  

## 2011-10-28 ENCOUNTER — Encounter: Payer: Self-pay | Admitting: Internal Medicine

## 2011-10-28 ENCOUNTER — Ambulatory Visit (INDEPENDENT_AMBULATORY_CARE_PROVIDER_SITE_OTHER): Payer: BC Managed Care – PPO | Admitting: Internal Medicine

## 2011-10-28 VITALS — BP 120/70 | HR 72 | Temp 98.2°F | Resp 16 | Ht 61.0 in | Wt 164.0 lb

## 2011-10-28 DIAGNOSIS — S93409A Sprain of unspecified ligament of unspecified ankle, initial encounter: Secondary | ICD-10-CM

## 2011-10-28 DIAGNOSIS — S93402A Sprain of unspecified ligament of left ankle, initial encounter: Secondary | ICD-10-CM

## 2011-10-28 DIAGNOSIS — M722 Plantar fascial fibromatosis: Secondary | ICD-10-CM

## 2011-10-28 MED ORDER — METHYLPREDNISOLONE ACETATE 40 MG/ML IJ SUSP: 40.0000 mg | Freq: Once | INTRAMUSCULAR | Status: DC

## 2011-10-28 NOTE — Patient Instructions (Signed)
Stretch ankles as directed twice daily

## 2011-10-28 NOTE — Progress Notes (Signed)
  Subjective:    Patient ID: Amber Elliott, female    DOB: [birth year], 36 y.o.   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  She has no history of trauma and she wears flats primarily associated    Review of Systems  Constitutional: Negative for activity change, appetite change and fatigue.  HENT: Negative for ear pain, congestion, neck pain, postnasal drip and sinus pressure.   Eyes: Negative for redness and visual disturbance.  Respiratory: Negative for cough, shortness of breath and wheezing.   Gastrointestinal: Negative for abdominal pain and abdominal distention.  Genitourinary: Negative for dysuria, frequency and menstrual problem.  Musculoskeletal: Positive for myalgias, joint swelling and arthralgias.  Skin: Negative for rash and wound.  Neurological: Negative for dizziness, weakness and headaches.  Hematological: Negative for adenopathy. Does not bruise/bleed easily.  Psychiatric/Behavioral: Negative for disturbed wake/sleep cycle and decreased concentration.       Objective:   Physical Exam  Nursing note and vitals reviewed. Constitutional: She is oriented to person, place, and time. She appears well-developed and well-nourished. No distress.  HENT:  Head: Normocephalic and atraumatic.  Right Ear: External ear normal.  Left Ear: External ear normal.  Nose: Nose normal.  Mouth/Throat: Oropharynx is clear and moist.  Eyes: Conjunctivae and EOM are normal. Pupils are equal, round, and reactive to light.  Neck: Normal range of motion. Neck supple. No JVD present. No tracheal deviation present. No thyromegaly present.  Cardiovascular: Normal rate, regular rhythm, normal heart sounds and intact distal  pulses.   No murmur heard. Pulmonary/Chest: Effort normal and breath sounds normal. She has no wheezes. She exhibits no tenderness.  Abdominal: Soft. Bowel sounds are normal.  Musculoskeletal: Normal range of motion. She exhibits edema and tenderness.       Left heel pain  Lymphadenopathy:    She has no cervical adenopathy.  Neurological: She is alert and oriented to person, place, and time. She has normal reflexes. No cranial nerve deficit.  Skin: Skin is warm and dry. She is not diaphoretic.  Psychiatric: She has a normal mood and affect. Her behavior is normal.          Assessment & Plan:   Informed consent obtained and the patient's left heel was prepped with betadine. Local anesthesia was obtained with topical spray. Then 40 mg of Depo-Medrol and 1/2 cc of lidocaine was injected into the joint space. The patient tolerated the procedure without complications. Post injection care discussed with patient.  Patient has apparent plantar fasciitis of the left foot for which we'll give her an injection she also has mild ankle tendinitis for which we have given her stretching exercises she has

## 2011-11-11 ENCOUNTER — Telehealth: Payer: Self-pay | Admitting: Internal Medicine

## 2011-11-11 NOTE — Telephone Encounter (Signed)
Pt informed to go to urgent care there

## 2011-11-11 NOTE — Telephone Encounter (Signed)
Caller: Amber Elliott/Patient; PCP: Darryll Capers; CB#: [phone]; Call regarding Eyes Swollen;  Onset- 11/09/2011 Pt c/o of left eye swelling and clear drainage.  States it is tender over sinus on that side. Emergent s/s of Eye: Infection or Irrittation protocol r/o. Pt to see provider wihtin 4 hrs. Pt requesting a message be sent to provider because she is out of town in [place]. States she even has a picture if that helps. Pt is aware she will probably need evaluation in area where she is currently.

## 2012-02-17 ENCOUNTER — Encounter: Payer: Self-pay | Admitting: Internal Medicine

## 2012-02-17 ENCOUNTER — Ambulatory Visit (INDEPENDENT_AMBULATORY_CARE_PROVIDER_SITE_OTHER): Payer: BC Managed Care – PPO | Admitting: Internal Medicine

## 2012-02-17 VITALS — BP 130/80 | HR 76 | Temp 98.2°F | Resp 16 | Ht 61.0 in | Wt 170.0 lb

## 2012-02-17 DIAGNOSIS — F988 Other specified behavioral and emotional disorders with onset usually occurring in childhood and adolescence: Secondary | ICD-10-CM

## 2012-02-17 DIAGNOSIS — Z23 Encounter for immunization: Secondary | ICD-10-CM

## 2012-02-17 DIAGNOSIS — F909 Attention-deficit hyperactivity disorder, unspecified type: Secondary | ICD-10-CM

## 2012-02-17 MED ORDER — METHYLPHENIDATE HCL ER 20 MG PO TBCR: 20.0000 mg | EXTENDED_RELEASE_TABLET | Freq: Every day | ORAL | Status: DC

## 2012-02-17 MED ORDER — METHYLPHENIDATE HCL 10 MG PO TABS: 10.0000 mg | ORAL_TABLET | Freq: Every day | ORAL | Status: DC

## 2012-02-17 NOTE — Progress Notes (Signed)
  Subjective:    Patient ID: Amber Elliott, female    DOB: 08-Oct-1975, 36 y.o.   MRN: [id number]  HPI The pt has been doing well   Review of Systems  Constitutional: Negative for activity change, appetite change and fatigue.  HENT: Negative for ear pain, congestion, neck pain, postnasal drip and sinus pressure.   Eyes: Negative for redness and visual disturbance.  Respiratory: Negative for cough, shortness of breath and wheezing.   Gastrointestinal: Negative for abdominal pain and abdominal distention.  Genitourinary: Negative for dysuria, frequency and menstrual problem.  Musculoskeletal: Negative for myalgias, joint swelling and arthralgias.  Skin: Negative for rash and wound.  Neurological: Negative for dizziness, weakness and headaches.  Hematological: Negative for adenopathy. Does not bruise/bleed easily.  Psychiatric/Behavioral: Negative for disturbed wake/sleep cycle and decreased concentration.       Objective:   Physical Exam  Vitals reviewed. Constitutional: She is oriented to person, place, and time. She appears well-developed and well-nourished. No distress.  HENT:  Head: Normocephalic and atraumatic.  Right Ear: External ear normal.  Left Ear: External ear normal.  Nose: Nose normal.  Mouth/Throat: Oropharynx is clear and moist.  Eyes: Conjunctivae normal and EOM are normal. Pupils are equal, round, and reactive to light.  Neck: Normal range of motion. Neck supple. No JVD present. No tracheal deviation present. No thyromegaly present.  Cardiovascular: Normal rate, regular rhythm, normal heart sounds and intact distal pulses.   No murmur heard. Pulmonary/Chest: Effort normal and breath sounds normal. She has no wheezes. She exhibits no tenderness.  Abdominal: Soft. Bowel sounds are normal.  Musculoskeletal: Normal range of motion. She exhibits no edema and no tenderness.  Lymphadenopathy:    She has no cervical adenopathy.  Neurological: She is alert and  oriented to person, place, and time. She has normal reflexes. No cranial nerve deficit.  Skin: Skin is warm and dry. She is not diaphoretic.  Psychiatric: She has a normal mood and affect. Her behavior is normal.          Assessment & Plan:  ADD followed up Weight discussion

## 2012-02-17 NOTE — Patient Instructions (Addendum)
The patient is instructed to continue all medications as prescribed. Schedule followup with check out clerk upon leaving the clinic  

## 2012-04-05 ENCOUNTER — Encounter: Payer: Self-pay | Admitting: Family Medicine

## 2012-04-05 ENCOUNTER — Ambulatory Visit (INDEPENDENT_AMBULATORY_CARE_PROVIDER_SITE_OTHER): Payer: BC Managed Care – PPO | Admitting: Family Medicine

## 2012-04-05 ENCOUNTER — Other Ambulatory Visit: Payer: Self-pay | Admitting: Family Medicine

## 2012-04-05 ENCOUNTER — Ambulatory Visit (INDEPENDENT_AMBULATORY_CARE_PROVIDER_SITE_OTHER)
Admission: RE | Admit: 2012-04-05 | Discharge: 2012-04-05 | Disposition: A | Discharge status: Home / Self Care | Payer: BC Managed Care – PPO | Source: Ambulatory Visit | Attending: Family Medicine | Admitting: Family Medicine

## 2012-04-05 VITALS — BP 110/78 | HR 86 | Temp 98.8°F | Wt 164.0 lb

## 2012-04-05 DIAGNOSIS — M249 Joint derangement, unspecified: Secondary | ICD-10-CM

## 2012-04-05 NOTE — Patient Instructions (Addendum)
-  we will contact you if xrays show something abnormal  -otherwise follow up with your doctor if change in symptoms, pain, swelling or other concerns

## 2012-04-05 NOTE — Addendum Note (Signed)
Addended by: Azucena Freed on: 04/05/2012 04:22 PM   Modules accepted: Orders

## 2012-04-05 NOTE — Progress Notes (Signed)
Chief Complaint  Patient presents with  . lump on left shoulder    HPI:  Bump on collar bone: -noticed several weeks ago -no pain really, might have some pain since she noticed it but thinks more just fixating on it -does have bump on other side in same place, but feels different -no trauma, fx of collar bone or ribs fx that she is aware of -no fevers, chills, weight loss  ROS: See pertinent positives and negatives per HPI.  Past Medical History  Diagnosis Date  . ATTENTION DEFICIT DISORDER, ADULT 05/14/2007  . ALLERGIC RHINITIS 02/02/2007  . ASTHMA 02/02/2007  . GERD 05/14/2007    No family history on file.  History   Social History  . Marital Status: Single    Spouse Name: N/A    Number of Children: N/A  . Years of Education: N/A   Social History Main Topics  . Smoking status: Never Smoker   . Smokeless tobacco: None  . Alcohol Use: None  . Drug Use: None  . Sexually Active: None   Other Topics Concern  . None   Social History Narrative  . None    Current outpatient prescriptions:albuterol (PROAIR HFA) 108 (90 BASE) MCG/ACT inhaler, Inhale 2 puffs into the lungs 2 (two) times daily as needed for wheezing., Disp: 8.5 g, Rfl: 11;  Calcium Citrate Malate-Vit D (CALCIUM + D) 250-100 MG-UNIT TABS, Take by mouth daily.  , Disp: , Rfl: ;  methylphenidate (METADATE ER) 20 MG ER tablet, Take 1 tablet (20 mg total) by mouth daily., Disp: 90 tablet, Rfl: 0 methylphenidate (RITALIN) 10 MG tablet, Take 1 tablet (10 mg total) by mouth daily. One at noon, Disp: 90 tablet, Rfl: 0;  Multiple Vitamin (MULTIVITAMIN) capsule, Take 1 capsule by mouth daily.  , Disp: , Rfl:  Current facility-administered medications:methylPREDNISolone acetate (DEPO-MEDROL) injection 40 mg, 40 mg, Intra-articular, Once, Stacie Glaze, MD  [facility] Vitals:   04/05/12 1538  BP: 110/78  Pulse: 86  Temp: 98.8 F (37.1 C)    There is no height on file to calculate BMI.  GENERAL: vitals  reviewed and listed above, alert, oriented, appears well hydrated and in no acute distress  NECK: no obvious masses on inspection  MS: moves all extremities without noticeable abnormality - area of concern for patient is the L acromioclavicular joint area - slightly more prominent on L compared to R, no pain in this area on palpation   PSYCH: pleasant and cooperative, no obvious depression or anxiety  ASSESSMENT AND PLAN:  Discussed the following assessment and plan:  1. 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  Patient Instructions  -we will contact you if xrays show something abnormal  -otherwise follow up with your doctor if change in symptoms, pain, swelling or other concerns     KIM, HANNAH R.

## 2012-04-06 ENCOUNTER — Telehealth: Payer: Self-pay | Admitting: Family Medicine

## 2012-04-06 NOTE — Telephone Encounter (Signed)
Left a message for pt to return call 

## 2012-04-06 NOTE — Telephone Encounter (Signed)
Amber Elliott,  Let her know her xrays looked ggod. The radiologist noted that seems to be normal anatomy and maybe a little arthritis like we discussed at her appointment.

## 2012-04-06 NOTE — Telephone Encounter (Signed)
Called and spoke with pt and pt is aware of results.  

## 2012-05-07 ENCOUNTER — Telehealth: Payer: Self-pay | Admitting: Internal Medicine

## 2012-05-07 NOTE — Telephone Encounter (Signed)
Patient Information:  Caller Name: Vicky  Phone: [phone]  Patient: Amber Elliott, Amber Elliott  Gender: Female  DOB: 1975-07-30  Age: 36 Years  PCP: Darryll Capers (Adults only)  Pregnant: No  Office Follow Up:  Does the office need to follow up with this patient?: No  Instructions For The Office: N/A   Symptoms  Reason For Call & Symptoms: Has cough since 12-21. Is using Albuterol for wheezing and tightness in chest.  Reviewed Health History In EMR: Yes  Reviewed Medications In EMR: Yes  Reviewed Allergies In EMR: Yes  Reviewed Surgeries / Procedures: Yes  Date of Onset of Symptoms: 05/05/2012  Treatments Tried: Warm steam Albuterol  Treatments Tried Worked: Yes OB / GYN:  LMP: 04/23/2012  Guideline(s) Used:  Cough  Disposition Per Guideline:   See Today or Tomorrow in Office  Reason For Disposition Reached:   Patient wants to be seen  Advice Given:  Cough Medicines:  Home Remedy - Honey: This old home remedy has been shown to help decrease coughing at night. The adult dosage is 2 teaspoons (10 ml) at bedtime. Honey should not be given to infants under one year of age.  Appointment Scheduled:  05/08/2012 12:30:00 Appointment Scheduled Provider:  Adline Mango [facility])

## 2012-05-08 ENCOUNTER — Ambulatory Visit: Payer: Self-pay | Admitting: Family

## 2012-06-04 ENCOUNTER — Ambulatory Visit (INDEPENDENT_AMBULATORY_CARE_PROVIDER_SITE_OTHER): Payer: BC Managed Care – PPO | Admitting: Internal Medicine

## 2012-06-04 ENCOUNTER — Encounter: Payer: Self-pay | Admitting: Internal Medicine

## 2012-06-04 VITALS — BP 124/78 | HR 72 | Temp 98.3°F | Resp 16 | Ht 61.0 in | Wt 156.0 lb

## 2012-06-04 DIAGNOSIS — F909 Attention-deficit hyperactivity disorder, unspecified type: Secondary | ICD-10-CM

## 2012-06-04 DIAGNOSIS — F988 Other specified behavioral and emotional disorders with onset usually occurring in childhood and adolescence: Secondary | ICD-10-CM

## 2012-06-04 MED ORDER — METHYLPHENIDATE HCL 10 MG PO TABS: 10.0000 mg | ORAL_TABLET | Freq: Every day | ORAL | Status: DC

## 2012-06-04 MED ORDER — METHYLPHENIDATE HCL ER 20 MG PO TBCR: 20.0000 mg | EXTENDED_RELEASE_TABLET | Freq: Every day | ORAL | Status: DC

## 2012-06-04 NOTE — Progress Notes (Signed)
Subjective:    Patient ID: Amber Elliott, female    DOB: [birth year], 37 y.o.   MRN: [id number]  HPI ADD Weight loss Mole check on mole on back    Review of Systems  Constitutional: Negative for activity change, appetite change and fatigue.  HENT: Negative for ear pain, congestion, neck pain, postnasal drip and sinus pressure.   Eyes: Negative for redness and visual disturbance.  Respiratory: Negative for cough, shortness of breath and wheezing.   Gastrointestinal: Negative for abdominal pain and abdominal distention.  Genitourinary: Negative for dysuria, frequency and menstrual problem.  Musculoskeletal: Negative for myalgias, joint swelling and arthralgias.  Skin: Negative for rash and wound.  Neurological: Negative for dizziness, weakness and headaches.  Hematological: Negative for adenopathy. Does not bruise/bleed easily.  Psychiatric/Behavioral: Negative for sleep disturbance and decreased concentration.   Past Medical History  Diagnosis Date  . ATTENTION DEFICIT DISORDER, ADULT 05/14/2007  . ALLERGIC RHINITIS 02/02/2007  . ASTHMA 02/02/2007  . GERD 05/14/2007    History   Social History  . Marital Status: Single    Spouse Name: N/A    Number of Children: N/A  . Years of Education: N/A   Occupational History  . Not on file.   Social History Main Topics  . Smoking status: Never Smoker   . Smokeless tobacco: Not on file  . Alcohol Use: Not on file  . Drug Use: Not on file  . Sexually Active: Not on file   Other Topics Concern  . Not on file   Social History Narrative  . No narrative on file    No past surgical history on file.  No family history on file.  No Known Allergies  Current Outpatient Prescriptions on File Prior to Visit  Medication Sig Dispense Refill  . albuterol (PROAIR HFA) 108 (90 BASE) MCG/ACT inhaler Inhale 2 puffs into the lungs 2 (two) times daily as needed for wheezing.  8.5 g  11  . Calcium Citrate Malate-Vit D (CALCIUM + D)  250-100 MG-UNIT TABS Take by mouth daily.        . methylphenidate (RITALIN) 10 MG tablet Take 1 tablet (10 mg total) by mouth daily. One at noon  90 tablet  0  . Multiple Vitamin (MULTIVITAMIN) capsule Take 1 capsule by mouth daily.          BP 124/78  Pulse 72  Temp 98.3 F (36.8 C)  Resp 16  Ht 5\' 1"  (1.549 m)  Wt 156 lb (70.761 kg)  BMI 29.48 kg/m2       Objective:   Physical Exam  Nursing note and vitals reviewed. Constitutional: She is oriented to person, place, and time. She appears well-developed and well-nourished. No distress.  HENT:  Head: Normocephalic and atraumatic.  Right Ear: External ear normal.  Left Ear: External ear normal.  Nose: Nose normal.  Mouth/Throat: Oropharynx is clear and moist.  Eyes: Conjunctivae normal and EOM are normal. Pupils are equal, round, and reactive to light.  Neck: Normal range of motion. Neck supple. No JVD present. No tracheal deviation present. No thyromegaly present.  Cardiovascular: Normal rate, regular rhythm, normal heart sounds and intact distal pulses.   No murmur heard. Pulmonary/Chest: Effort normal and breath sounds normal. She has no wheezes. She exhibits no tenderness.  Abdominal: Soft. Bowel sounds are normal.  Musculoskeletal: Normal range of motion. She exhibits no edema and no tenderness.  Lymphadenopathy:    She has no cervical adenopathy.  Neurological: She is alert and oriented  to person, place, and time. She has normal reflexes. No cranial nerve deficit.  Skin: Skin is warm and dry. She is not diaphoretic.  Psychiatric: She has a normal mood and affect. Her behavior is normal.          Assessment & Plan:  Pigmented nevi on back ADD refill per protocol

## 2012-06-04 NOTE — Patient Instructions (Signed)
The patient is instructed to continue all medications as prescribed. Schedule followup with check out clerk upon leaving the clinic  

## 2012-09-03 ENCOUNTER — Ambulatory Visit: Payer: BC Managed Care – PPO | Admitting: Internal Medicine

## 2012-11-05 ENCOUNTER — Other Ambulatory Visit: Payer: Self-pay

## 2012-11-05 DIAGNOSIS — Z1231 Encounter for screening mammogram for malignant neoplasm of breast: Secondary | ICD-10-CM

## 2012-11-12 ENCOUNTER — Ambulatory Visit
Admission: RE | Admit: 2012-11-12 | Discharge: 2012-11-12 | Disposition: A | Discharge status: Home / Self Care | Payer: BC Managed Care – PPO | Source: Ambulatory Visit

## 2012-11-12 DIAGNOSIS — Z1231 Encounter for screening mammogram for malignant neoplasm of breast: Secondary | ICD-10-CM

## 2012-12-21 ENCOUNTER — Encounter: Payer: Self-pay | Admitting: Internal Medicine

## 2012-12-21 ENCOUNTER — Ambulatory Visit (INDEPENDENT_AMBULATORY_CARE_PROVIDER_SITE_OTHER): Payer: BC Managed Care – PPO | Admitting: Internal Medicine

## 2012-12-21 VITALS — BP 110/76 | HR 72 | Temp 98.3°F | Resp 16 | Ht 61.0 in | Wt 156.0 lb

## 2012-12-21 DIAGNOSIS — Z23 Encounter for immunization: Secondary | ICD-10-CM

## 2012-12-21 DIAGNOSIS — F988 Other specified behavioral and emotional disorders with onset usually occurring in childhood and adolescence: Secondary | ICD-10-CM

## 2012-12-21 NOTE — Patient Instructions (Signed)
The patient is instructed to continue all medications as prescribed. Schedule followup with check out clerk upon leaving the clinic  

## 2012-12-21 NOTE — Progress Notes (Signed)
  Subjective:    Patient ID: Amber Elliott, female    DOB: 1975/09/13, 37 y.o.   MRN: [id number]  HPI We reviewed her problem list and eliminated all inactive problems including some chronic problems that have resolved since she has been able to consistently control her weight and her asthma.     Review of Systems  Constitutional: Negative for activity change, appetite change and fatigue.  HENT: Negative for ear pain, congestion, neck pain, postnasal drip and sinus pressure.   Eyes: Negative for redness and visual disturbance.  Respiratory: Negative for cough, shortness of breath and wheezing.   Gastrointestinal: Negative for abdominal pain and abdominal distention.  Genitourinary: Negative for dysuria, frequency and menstrual problem.  Musculoskeletal: Negative for myalgias, joint swelling and arthralgias.  Skin: Negative for rash and wound.  Neurological: Negative for dizziness, weakness and headaches.  Hematological: Negative for adenopathy. Does not bruise/bleed easily.  Psychiatric/Behavioral: Negative for sleep disturbance and decreased concentration.       Objective:   Physical Exam  Nursing note and vitals reviewed. Constitutional: She is oriented to person, place, and time. She appears well-developed and well-nourished. No distress.  HENT:  Head: Normocephalic and atraumatic.  Right Ear: External ear normal.  Left Ear: External ear normal.  Nose: Nose normal.  Mouth/Throat: Oropharynx is clear and moist.  Eyes: Conjunctivae and EOM are normal. Pupils are equal, round, and reactive to light.  Neck: Normal range of motion. Neck supple. No JVD present. No tracheal deviation present. No thyromegaly present.  Cardiovascular: Normal rate, regular rhythm, normal heart sounds and intact distal pulses.   No murmur heard. Pulmonary/Chest: Effort normal and breath sounds normal. She has no wheezes. She exhibits no tenderness.  Abdominal: Soft. Bowel sounds are normal.   Musculoskeletal: Normal range of motion. She exhibits no edema and no tenderness.  Lymphadenopathy:    She has no cervical adenopathy.  Neurological: She is alert and oriented to person, place, and time. She has normal reflexes. No cranial nerve deficit.  Skin: Skin is warm and dry. She is not diaphoretic.  Psychiatric: She has a normal mood and affect. Her behavior is normal.          Assessment & Plan:  Discussed the role of gluten and legumes in her plateau of her weight loss efforts.  Continued exercise.  Blood pressures excellent gastroesophageal reflux has resolved and deleted from the problem list asthma stable her current medications Is no longer on maintenance medications for asthma and she uses a Proair inhaler on when necessary basis only  Off ADD medications now for 4 months stable.

## 2012-12-21 NOTE — Addendum Note (Signed)
Addended by: Willy Eddy on: 12/21/2012 11:42 AM   Modules accepted: Orders

## 2013-01-21 ENCOUNTER — Telehealth: Payer: Self-pay | Admitting: Internal Medicine

## 2013-01-21 MED ORDER — METHYLPHENIDATE HCL ER 20 MG PO TBCR: 20.0000 mg | EXTENDED_RELEASE_TABLET | ORAL | Status: DC

## 2013-01-21 NOTE — Telephone Encounter (Signed)
Printed and will call pt to pikc up after dr jenkins signs 

## 2013-01-21 NOTE — Telephone Encounter (Signed)
Patient requesting refill on Ritalin 20mg  extended release. Please call when ready for pick up.

## 2013-01-23 ENCOUNTER — Ambulatory Visit (INDEPENDENT_AMBULATORY_CARE_PROVIDER_SITE_OTHER): Payer: BC Managed Care – PPO | Admitting: *Deleted

## 2013-01-23 DIAGNOSIS — Z23 Encounter for immunization: Secondary | ICD-10-CM

## 2013-02-25 ENCOUNTER — Telehealth: Payer: Self-pay | Admitting: Internal Medicine

## 2013-02-25 MED ORDER — AZITHROMYCIN 250 MG PO TABS: 250.0000 mg | ORAL_TABLET | Freq: Every day | ORAL | Status: DC

## 2013-02-25 NOTE — Telephone Encounter (Signed)
May have z pack- pt informed

## 2013-02-25 NOTE — Telephone Encounter (Signed)
Pt has sinus inf? Would like to know if Dr Lovell Sheehan would cal in RX.  Pt has had issues for 2 wks +. 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 Pharm: [facility]

## 2013-02-25 NOTE — Telephone Encounter (Signed)
Attempted call back for RN triage. LM on identified VM.

## 2013-04-22 ENCOUNTER — Telehealth: Payer: Self-pay | Admitting: Internal Medicine

## 2013-04-22 ENCOUNTER — Other Ambulatory Visit: Payer: Self-pay | Admitting: *Deleted

## 2013-04-22 MED ORDER — METHYLPHENIDATE HCL 10 MG PO TABS: 10.0000 mg | ORAL_TABLET | Freq: Two times a day (BID) | ORAL | Status: DC

## 2013-04-22 NOTE — Telephone Encounter (Signed)
Pt needs a new rx methylphenidate 10 mg twice a day #60. Pt no longer want methylphenidate 20 mg er

## 2013-04-22 NOTE — Telephone Encounter (Signed)
Printed and will call pt to pick up after dr jenkins signs 

## 2013-05-01 ENCOUNTER — Other Ambulatory Visit: Payer: Self-pay | Admitting: *Deleted

## 2013-05-01 MED ORDER — METHYLPHENIDATE HCL ER 20 MG PO TBCR: 20.0000 mg | EXTENDED_RELEASE_TABLET | ORAL | Status: DC

## 2013-05-13 ENCOUNTER — Ambulatory Visit (INDEPENDENT_AMBULATORY_CARE_PROVIDER_SITE_OTHER): Payer: BC Managed Care – PPO | Admitting: Internal Medicine

## 2013-05-13 ENCOUNTER — Encounter: Payer: Self-pay | Admitting: Internal Medicine

## 2013-05-13 VITALS — BP 110/70 | HR 72 | Temp 98.2°F | Resp 16 | Ht 61.0 in | Wt 151.0 lb

## 2013-05-13 DIAGNOSIS — Z23 Encounter for immunization: Secondary | ICD-10-CM

## 2013-05-13 DIAGNOSIS — J45909 Unspecified asthma, uncomplicated: Secondary | ICD-10-CM

## 2013-05-13 DIAGNOSIS — L219 Seborrheic dermatitis, unspecified: Secondary | ICD-10-CM

## 2013-05-13 MED ORDER — DESONIDE 0.05 % EX LOTN: TOPICAL_LOTION | Freq: Two times a day (BID) | CUTANEOUS | Status: DC

## 2013-05-13 NOTE — Progress Notes (Signed)
Subjective:    Patient ID: Amber Elliott, female    DOB: [birth year], 37 y.o.   MRN: [id number]  HPI  Follow up for  Mild asthma ADD discussion  Review of Systems  Constitutional: Negative for activity change, appetite change and fatigue.  HENT: Negative for congestion, ear pain, postnasal drip and sinus pressure.   Eyes: Negative for redness and visual disturbance.  Respiratory: Negative for cough, shortness of breath and wheezing.   Gastrointestinal: Negative for abdominal pain and abdominal distention.  Genitourinary: Negative for dysuria, frequency and menstrual problem.  Musculoskeletal: Negative for arthralgias, joint swelling, myalgias and neck pain.  Skin: Negative for rash and wound.  Neurological: Negative for dizziness, weakness and headaches.  Hematological: Negative for adenopathy. Does not bruise/bleed easily.  Psychiatric/Behavioral: Negative for sleep disturbance and decreased concentration.   Past Medical History  Diagnosis Date  . ATTENTION DEFICIT DISORDER, ADULT 05/14/2007  . ALLERGIC RHINITIS 02/02/2007  . ASTHMA 02/02/2007  . GERD 05/14/2007    History   Social History  . Marital Status: Single    Spouse Name: N/A    Number of Children: N/A  . Years of Education: N/A   Occupational History  . Not on file.   Social History Main Topics  . Smoking status: Never Smoker   . Smokeless tobacco: Not on file  . Alcohol Use: Not on file  . Drug Use: Not on file  . Sexual Activity: Yes   Other Topics Concern  . Not on file   Social History Narrative  . No narrative on file    Past Surgical History  Procedure Laterality Date  . Eye surgery      esotropia    Family History  Problem Relation Age of Onset  . Arthritis Mother     No Known Allergies  Current Outpatient Prescriptions on File Prior to Visit  Medication Sig Dispense Refill  . albuterol (PROAIR HFA) 108 (90 BASE) MCG/ACT inhaler Inhale 2 puffs into the lungs 2 (two) times daily  as needed for wheezing.  8.5 g  11  . Calcium Citrate Malate-Vit D (CALCIUM + D) 250-100 MG-UNIT TABS Take by mouth daily.        . methylphenidate (METADATE ER) 20 MG ER tablet Take 1 tablet (20 mg total) by mouth every morning.  90 tablet  0  . Multiple Vitamin (MULTIVITAMIN) capsule Take 1 capsule by mouth daily.         No current facility-administered medications on file prior to visit.    BP 110/70  Pulse 72  Temp(Src) 98.2 F (36.8 C)  Resp 16  Ht 5\' 1"  (1.549 m)  Wt 151 lb (68.493 kg)  BMI 28.55 kg/m2       Objective:   Physical Exam  Constitutional: She is oriented to person, place, and time. She appears well-developed and well-nourished. No distress.  HENT:  Head: Normocephalic and atraumatic.  Eyes: Conjunctivae and EOM are normal. Pupils are equal, round, and reactive to light.  Neck: Normal range of motion. Neck supple. No JVD present. No tracheal deviation present. No thyromegaly present.  Cardiovascular: Normal rate and regular rhythm.   No murmur heard. Pulmonary/Chest: Effort normal and breath sounds normal. She has no wheezes. She exhibits no tenderness.  Abdominal: Soft. Bowel sounds are normal.  Musculoskeletal: Normal range of motion. She exhibits no edema and no tenderness.  Lymphadenopathy:    She has no cervical adenopathy.  Neurological: She is alert and oriented to person, place, and  time. She has normal reflexes. No cranial nerve deficit.  Skin: Skin is warm and dry. She is not diaphoretic.  Psychiatric: She has a normal mood and affect. Her behavior is normal.          Assessment & Plan:  Stable asthma ADD ritlain rx Atopic rash on hand  Desonide given to try

## 2013-05-13 NOTE — Progress Notes (Signed)
Pre visit review using our clinic review tool, if applicable. No additional management support is needed unless otherwise documented below in the visit note. 

## 2013-05-16 HISTORY — PX: STRABISMUS SURGERY: SHX218

## 2013-07-29 ENCOUNTER — Telehealth: Payer: Self-pay | Admitting: Internal Medicine

## 2013-07-29 NOTE — Telephone Encounter (Signed)
Pt is needed new rx methylphenidate (METADATE ER) 20 MG ER tablet, please call when available for pick up.

## 2013-07-31 ENCOUNTER — Telehealth: Payer: Self-pay | Admitting: Internal Medicine

## 2013-07-31 MED ORDER — METHYLPHENIDATE HCL ER 20 MG PO TBCR: 20.0000 mg | EXTENDED_RELEASE_TABLET | ORAL | Status: DC

## 2013-07-31 NOTE — Telephone Encounter (Signed)
Left detailed message Rx ready for pickup, will be at the front desk. Rx printed and signed. 

## 2013-07-31 NOTE — Telephone Encounter (Signed)
Patient Information:  Caller Name: Nazirah  Phone: [phone]  Patient: Amber Elliott, Amber Elliott  Gender: Female  DOB: August 15, 1975  Age: 38 Years  PCP: Benay Pillow (Adults only)  Pregnant: No  Office Follow Up:  Does the office need to follow up with this patient?: Yes  Instructions For The Office: [place] appointment - requesting ZPak be called in to [place].   Symptoms  Reason For Call & Symptoms: Erielle states she thinks she has a sinus infection. Had green blood streaked mucus from nose on 07/31/13.  Facial tenderness and redness below eye. Becoming hoarse onset 12:00. Chest is a little  tight. Has history of asthma. Using Albuterol inhaler prn. Per sinus pain and congestion protocol has go to office now disposition due to Redness  or swelling  on the cheek, forehead or around the eye. Declined appointment.  Requesting  [facility] into [facility]. Advised Anniece that no oral antibiotics are called in without being seenin office. States antibiotics have been called in to pharmacy in the past. Requesting prescription be called in to pharmacy. Ivanna can be reached at [phone].  Reviewed Health History In EMR: Yes  Reviewed Medications In EMR: Yes  Reviewed Allergies In EMR: Yes  Reviewed Surgeries / Procedures: Yes  Date of Onset of Symptoms: 07/31/2013  Treatments Tried: Albuterol inhaler  Treatments Tried Worked: No OB / GYN:  LMP: 07/09/2013  Guideline(s) Used:  Sinus Pain and Congestion  Disposition Per Guideline:   Go to Office Now  Reason For Disposition Reached:   Redness or swelling on the cheek, forehead, or around the eye  Advice Given:  N/A  RN Overrode Recommendation:  Patient Requests Prescription  States prescription has been called in before

## 2013-08-02 ENCOUNTER — Ambulatory Visit (INDEPENDENT_AMBULATORY_CARE_PROVIDER_SITE_OTHER): Payer: BC Managed Care – PPO | Admitting: Family Medicine

## 2013-08-02 ENCOUNTER — Encounter: Payer: Self-pay | Admitting: Family Medicine

## 2013-08-02 VITALS — BP 100/68 | Temp 98.3°F | Wt 145.0 lb

## 2013-08-02 DIAGNOSIS — J069 Acute upper respiratory infection, unspecified: Secondary | ICD-10-CM

## 2013-08-02 MED ORDER — HYDROCOD POLST-CHLORPHEN POLST 10-8 MG/5ML PO LQCR: 5.0000 mL | Freq: Two times a day (BID) | ORAL | Status: DC | PRN

## 2013-08-02 MED ORDER — AZITHROMYCIN 250 MG PO TABS: ORAL_TABLET | ORAL | Status: DC

## 2013-08-02 NOTE — Telephone Encounter (Signed)
Per Dr Arnoldo Morale ok to call in zpack.  Rx sent in electronically

## 2013-08-02 NOTE — Progress Notes (Signed)
Pre visit review using our clinic review tool, if applicable. No additional management support is needed unless otherwise documented below in the visit note. 

## 2013-08-02 NOTE — Progress Notes (Signed)
Chief Complaint  Patient presents with  . Hoarse    cough    HPI:  -started: 3-4 days ago -symptoms:nasal congestion, tickle in throat, cough, laryngitis -denies:fever, SOB, wheezing, NVD, tooth pain -has tried: sudafed and her inhaler for the cough -sick contacts/travel/risks: denies flu exposure, tick exposure or or Ebola risks -Hx of: allergies and mild asthma when sick for which she uses albuterol  ROS: See pertinent positives and negatives per HPI.  Past Medical History  Diagnosis Date  . ATTENTION DEFICIT DISORDER, ADULT 05/14/2007  . ALLERGIC RHINITIS 02/02/2007  . ASTHMA 02/02/2007  . GERD 05/14/2007    Past Surgical History  Procedure Laterality Date  . Eye surgery      esotropia    Family History  Problem Relation Age of Onset  . Arthritis Mother     History   Social History  . Marital Status: Single    Spouse Name: N/A    Number of Children: N/A  . Years of Education: N/A   Social History Main Topics  . Smoking status: Never Smoker   . Smokeless tobacco: None  . Alcohol Use: None  . Drug Use: None  . Sexual Activity: Yes   Other Topics Concern  . None   Social History Narrative  . None    Current outpatient prescriptions:albuterol (PROAIR HFA) 108 (90 BASE) MCG/ACT inhaler, Inhale 2 puffs into the lungs 2 (two) times daily as needed for wheezing., Disp: 8.5 g, Rfl: 11;  Calcium Citrate Malate-Vit D (CALCIUM + D) 250-100 MG-UNIT TABS, Take by mouth daily.  , Disp: , Rfl: ;  desonide (DESOWEN) 0.05 % lotion, Apply topically 2 (two) times daily., Disp: 59 mL, Rfl: 0 methylphenidate (METADATE ER) 20 MG ER tablet, Take 1 tablet (20 mg total) by mouth every morning., Disp: 90 tablet, Rfl: 0;  Multiple Vitamin (MULTIVITAMIN) capsule, Take 1 capsule by mouth daily.  , Disp: , Rfl: ;  chlorpheniramine-HYDROcodone (TUSSIONEX PENNKINETIC ER) 10-8 MG/5ML LQCR, Take 5 mLs by mouth every 12 (twelve) hours as needed., Disp: 115 mL, Rfl: 0  EXAM:  Filed Vitals:    08/02/13 1336  BP: 100/68  Temp: 98.3 F (36.8 C)    Body mass index is 27.41 kg/(m^2).  GENERAL: vitals reviewed and listed above, alert, oriented, appears well hydrated and in no acute distress  HEENT: atraumatic, conjunttiva clear, no obvious abnormalities on inspection of external nose and ears, normal appearance of ear canals and TMs, clear nasal congestion, mild post oropharyngeal erythema with PND, no tonsillar edema or exudate, no sinus TTP  NECK: no obvious masses on inspection  LUNGS: clear to auscultation bilaterally, no wheezes, rales or rhonchi, good air movement  CV: HRRR, no peripheral edema  MS: moves all extremities without noticeable abnormality  PSYCH: pleasant and cooperative, no obvious depression or anxiety  ASSESSMENT AND PLAN:  Discussed the following assessment and plan:  Upper respiratory infection - Plan: chlorpheniramine-HYDROcodone (TUSSIONEX PENNKINETIC ER) 10-8 MG/5ML LQCR  -given HPI and exam findings today, a serious infection or illness is unlikely. We discussed potential etiologies, with VURI being most likely, and advised supportive care and monitoring. We discussed treatment side effects, likely course, antibiotic misuse, transmission, and signs of developing a serious illness. -offered prednisone for the asthma, but she is not having any SOB or wheezing and prefers prn alb and follow up if worsening -of course, we advised to return or notify a doctor immediately if symptoms worsen or persist or new concerns arise.    Patient  Instructions  INSTRUCTIONS FOR UPPER RESPIRATORY INFECTION:  -plenty of rest and fluids  -nasal saline wash 2-3 times daily (use prepackaged nasal saline or bottled/distilled water if making your own)   -clean nose with nasal saline before using the nasal steroid or sinex  -can use sinex or afrin nasal spray for drainage and nasal congestion - but do NOT use longer then 3-4 days  -can use tylenol or ibuprofen  as directed for aches and sorethroat  -in the winter time, using a humidifier at night is helpful (please follow cleaning instructions)  -if you are taking a cough medication - use only as directed, may also try a teaspoon of honey to coat the throat and throat lozenges  -for sore throat, salt water gargles can help  -follow up if you have fevers, facial pain, tooth pain, difficulty breathing or are worsening or not getting better in 5-7 days      KIM, HANNAH R.

## 2013-08-02 NOTE — Patient Instructions (Signed)
INSTRUCTIONS FOR UPPER RESPIRATORY INFECTION:  -plenty of rest and fluids  -nasal saline wash 2-3 times daily (use prepackaged nasal saline or bottled/distilled water if making your own)   -clean nose with nasal saline before using the nasal steroid or sinex  -can use sinex or afrin nasal spray for drainage and nasal congestion - but do NOT use longer then 3-4 days  -can use tylenol or ibuprofen as directed for aches and sorethroat  -in the winter time, using a humidifier at night is helpful (please follow cleaning instructions)  -if you are taking a cough medication - use only as directed, may also try a teaspoon of honey to coat the throat and throat lozenges  -for sore throat, salt water gargles can help  -follow up if you have fevers, facial pain, tooth pain, difficulty breathing or are worsening or not getting better in 5-7 days

## 2013-11-11 ENCOUNTER — Ambulatory Visit: Payer: BC Managed Care – PPO | Admitting: Internal Medicine

## 2014-02-17 ENCOUNTER — Telehealth: Payer: Self-pay | Admitting: Internal Medicine

## 2014-02-17 DIAGNOSIS — L219 Seborrheic dermatitis, unspecified: Secondary | ICD-10-CM

## 2014-02-17 MED ORDER — DESONIDE 0.05 % EX LOTN: TOPICAL_LOTION | Freq: Two times a day (BID) | CUTANEOUS | Status: DC

## 2014-02-17 NOTE — Telephone Encounter (Signed)
Pt states she received a rx for desonide (DESOWEN) 0.05 % lotion but never had it filled at the pharmacy.  Pt is now in need of medication and is [facility] [address]

## 2014-02-17 NOTE — Telephone Encounter (Signed)
rx sent in electronically 

## 2014-02-20 MED ORDER — DESONIDE 0.05 % EX LOTN: TOPICAL_LOTION | Freq: Two times a day (BID) | CUTANEOUS | Status: DC

## 2014-02-20 NOTE — Addendum Note (Signed)
Addended by: Townsend Roger D on: 02/20/2014 04:19 PM   Modules accepted: Orders

## 2014-02-20 NOTE — Telephone Encounter (Signed)
Pharmacy said they did not receive the rx for the desonide . Can you please resend

## 2014-02-20 NOTE — Telephone Encounter (Signed)
rx resent electroncially

## 2014-05-05 ENCOUNTER — Encounter: Payer: Self-pay | Admitting: Family Medicine

## 2014-05-05 ENCOUNTER — Ambulatory Visit (INDEPENDENT_AMBULATORY_CARE_PROVIDER_SITE_OTHER): Payer: BC Managed Care – PPO | Admitting: Family Medicine

## 2014-05-05 VITALS — BP 98/70 | HR 67 | Temp 98.6°F | Ht 61.0 in | Wt 143.9 lb

## 2014-05-05 DIAGNOSIS — Z23 Encounter for immunization: Secondary | ICD-10-CM

## 2014-05-05 DIAGNOSIS — J0100 Acute maxillary sinusitis, unspecified: Secondary | ICD-10-CM

## 2014-05-05 MED ORDER — AMOXICILLIN-POT CLAVULANATE 875-125 MG PO TABS: 1.0000 | ORAL_TABLET | Freq: Two times a day (BID) | ORAL | Status: DC

## 2014-05-05 NOTE — Addendum Note (Signed)
Addended by: Agnes Lawrence on: 05/05/2014 01:56 PM   Modules accepted: Orders

## 2014-05-05 NOTE — Progress Notes (Signed)
Pre visit review using our clinic review tool, if applicable. No additional management support is needed unless otherwise documented below in the visit note. 

## 2014-05-05 NOTE — Patient Instructions (Signed)

## 2014-05-05 NOTE — Progress Notes (Signed)
   HPI:  URI: -started: 3 weeks ago -symptoms:nasal congestion, sore throat, cough now worsening the last week with maxillary sinus pain worse on R and worse with leaning forward -denies:fever, SOB, NVD, tooth pain -has tried: sudafed, OTC options -sick contacts/travel/risks: denies flu exposure, tick exposure or or Ebola risks -Hx of: allergies, mild intermittent asthma, sinusitis ROS: See pertinent positives and negatives per HPI.  Past Medical History  Diagnosis Date  . ATTENTION DEFICIT DISORDER, ADULT 05/14/2007  . ALLERGIC RHINITIS 02/02/2007  . ASTHMA 02/02/2007  . GERD 05/14/2007    Past Surgical History  Procedure Laterality Date  . Eye surgery      esotropia    Family History  Problem Relation Age of Onset  . Arthritis Mother     History   Social History  . Marital Status: Single    Spouse Name: N/A    Number of Children: N/A  . Years of Education: N/A   Social History Main Topics  . Smoking status: Never Smoker   . Smokeless tobacco: None  . Alcohol Use: None  . Drug Use: None  . Sexual Activity: Yes   Other Topics Concern  . None   Social History Narrative    Current outpatient prescriptions: albuterol (PROAIR HFA) 108 (90 BASE) MCG/ACT inhaler, Inhale 2 puffs into the lungs 2 (two) times daily as needed for wheezing., Disp: 8.5 g, Rfl: 11;  Calcium Citrate Malate-Vit D (CALCIUM + D) 250-100 MG-UNIT TABS, Take by mouth daily.  , Disp: , Rfl: ;  desonide (DESOWEN) 0.05 % lotion, Apply topically 2 (two) times daily., Disp: 59 mL, Rfl: 0 Multiple Vitamin (MULTIVITAMIN) capsule, Take 1 capsule by mouth daily.  , Disp: , Rfl: ;  amoxicillin-clavulanate (AUGMENTIN) 875-125 MG per tablet, Take 1 tablet by mouth 2 (two) times daily., Disp: 20 tablet, Rfl: 0  EXAM:  Filed Vitals:   05/05/14 1310  BP: 98/70  Pulse: 67  Temp: 98.6 F (37 C)    Body mass index is 27.2 kg/(m^2).  GENERAL: vitals reviewed and listed above, alert, oriented, appears well  hydrated and in no acute distress  HEENT: atraumatic, conjunttiva clear, no obvious abnormalities on inspection of external nose and ears, normal appearance of ear canals and TMs, thick  nasal congestion, mild post oropharyngeal erythema with PND, no tonsillar edema or exudate, R max sinus TTP  NECK: no obvious masses on inspection  LUNGS: clear to auscultation bilaterally, no wheezes, rales or rhonchi, good air movement  CV: HRRR, no peripheral edema  MS: moves all extremities without noticeable abnormality  PSYCH: pleasant and cooperative, no obvious depression or anxiety  ASSESSMENT AND PLAN:  Discussed the following assessment and plan:  Acute maxillary sinusitis, recurrence not specified   We discussed potential etiologies, with VURI being most likely, and advised supportive care and monitoring. We discussed treatment side effects, likely course. Given 3 weeks of symptoms, worsening and facial pain opted for abx for likely sinusitis. -of course, we advised to return or notify a doctor immediately if symptoms worsen or persist or new concerns arise.    There are no Patient Instructions on file for this visit.   Colin Benton R.

## 2014-05-19 ENCOUNTER — Telehealth: Payer: Self-pay | Admitting: Internal Medicine

## 2014-05-19 NOTE — Telephone Encounter (Signed)
Is a separate appt needed for this refill Dr. Yong Channel?

## 2014-05-19 NOTE — Telephone Encounter (Signed)
Yes - thank you

## 2014-05-19 NOTE — Telephone Encounter (Signed)
Pt needs new rx generic adderall xr 20 mg. Pt would like to start back taking med. Pt has appt w/[facility] in may 2016

## 2014-05-20 NOTE — Telephone Encounter (Signed)
Pt has been sch

## 2014-05-20 NOTE — Telephone Encounter (Signed)
Mrs. Amber Elliott please schedule pt per Dr. Yong Channel

## 2014-05-20 NOTE — Telephone Encounter (Signed)
lmom for pt to sch appt °

## 2014-06-09 ENCOUNTER — Ambulatory Visit (INDEPENDENT_AMBULATORY_CARE_PROVIDER_SITE_OTHER): Payer: BC Managed Care – PPO | Admitting: Family Medicine

## 2014-06-09 ENCOUNTER — Encounter: Payer: Self-pay | Admitting: Family Medicine

## 2014-06-09 VITALS — BP 110/80 | HR 66 | Temp 99.0°F | Wt 146.0 lb

## 2014-06-09 DIAGNOSIS — F900 Attention-deficit hyperactivity disorder, predominantly inattentive type: Secondary | ICD-10-CM

## 2014-06-09 DIAGNOSIS — F909 Attention-deficit hyperactivity disorder, unspecified type: Secondary | ICD-10-CM | POA: Insufficient documentation

## 2014-06-09 DIAGNOSIS — F988 Other specified behavioral and emotional disorders with onset usually occurring in childhood and adolescence: Secondary | ICD-10-CM | POA: Insufficient documentation

## 2014-06-09 MED ORDER — METHYLPHENIDATE HCL ER 20 MG PO TBCR: 20.0000 mg | EXTENDED_RELEASE_TABLET | ORAL | Status: DC

## 2014-06-09 NOTE — Patient Instructions (Addendum)
Pleasure to meet you.   Refilled medicine for 3 months. Call for refill 2.5 months. Then see each other in 6 months.   Please let me know if you have new side effects other than sweating like you had previously.   Health Maintenance Due  Topic Date Due  . PAP SMEAR -have them send [place] a copy  06/14/1992

## 2014-06-09 NOTE — Progress Notes (Signed)
Pre visit review using our clinic review tool, if applicable. No additional management support is needed unless otherwise documented below in the visit note. 

## 2014-06-09 NOTE — Progress Notes (Signed)
  Garret Reddish, MD Phone: [phone]  Subjective:   Amber Elliott is a 39 y.o. year old very pleasant female patient who presents with the following:  ADHD-poor control off medication Diagnosed as adolescent in HS and again in college after very poor testtaking focus.  Ritalin makes patient sweat profusely so stopped a year ago as was difficult in classroom. 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 She admits to some difficulty with work though none at home/interpersonally. Previously did well with methylphenidate ER 20mg  and is hopeful to restart. She thinks she can tolerate SE at this point.   ROS- complains of inattention, not being able to find things, losing focus. No palpitations, headaches, shortness of breath, chest pain.   Past Medical History- Patient Active Problem List   Diagnosis Date Noted  . ADHD (attention deficit hyperactivity disorder) 06/09/2014    Priority: Medium  . ALLERGIC RHINITIS 02/02/2007  . ASTHMA 02/02/2007   Medications- reviewed and updated Current Outpatient Prescriptions  Medication Sig Dispense Refill  . albuterol (PROAIR HFA) 108 (90 BASE) MCG/ACT inhaler Inhale 2 puffs into the lungs 2 (two) times daily as needed for wheezing. 8.5 g 11  . Calcium Citrate Malate-Vit D (CALCIUM + D) 250-100 MG-UNIT TABS Take by mouth daily.      Marland Kitchen desonide (DESOWEN) 0.05 % lotion Apply topically 2 (two) times daily. 59 mL 0  . Multiple Vitamin (MULTIVITAMIN) capsule Take 1 capsule by mouth daily.       No current facility-administered medications for this visit.    Objective: BP 110/80 mmHg  Pulse 66  Temp(Src) 99 F (37.2 C) (Oral)  Wt 146 lb (66.225 kg)  SpO2 98%  LMP 06/09/2014 (LMP Unknown) Gen: NAD, resting comfortably, pleasant, very talkative CV: RRR no murmurs rubs or gallops Lungs: CTAB no crackles, wheeze, rhonchi Ext: no edema Skin: warm, dry Neuro: grossly normal,  moves all extremities   Assessment/Plan:  ADHD (attention deficit hyperactivity disorder) Diagnosis in HS as well as college (repeat testing required before allowed to take medicine for GRE). Controlled with methylphenidate 20mg  ER in past (patient stopped due to excess sweating), at times with 10mg  short acting later in day. We opted to restart at 20 mg ER and follow up in 6 months in office or sooner for establish visit. She is aware may have same side effects but thinks they will be more tolerable with her new position. Encouraged continue exercise and lifestyle interventions for ADHD as well.    Return precautions advised.   Meds ordered this encounter  Medications  . methylphenidate (METADATE ER) 20 MG ER tablet    Sig: Take 1 tablet (20 mg total) by mouth every morning.    Dispense:  90 tablet    Refill:  0

## 2014-06-09 NOTE — Assessment & Plan Note (Addendum)
Diagnosis in HS as well as college (repeat testing required before allowed to take medicine for GRE). Controlled with methylphenidate 20mg  ER in past (patient stopped due to excess sweating), at times with 10mg  short acting later in day. We opted to restart at 20 mg ER and follow up in 6 months in office or sooner for establish visit. She is aware may have same side effects but thinks they will be more tolerable with her new position. Encouraged continue exercise and lifestyle interventions for ADHD as well.

## 2014-08-11 ENCOUNTER — Other Ambulatory Visit: Payer: Self-pay

## 2014-08-11 DIAGNOSIS — Z1231 Encounter for screening mammogram for malignant neoplasm of breast: Secondary | ICD-10-CM

## 2014-08-15 ENCOUNTER — Ambulatory Visit
Admission: RE | Admit: 2014-08-15 | Discharge: 2014-08-15 | Disposition: A | Discharge status: Home / Self Care | Payer: BC Managed Care – PPO | Source: Ambulatory Visit

## 2014-08-15 DIAGNOSIS — Z1231 Encounter for screening mammogram for malignant neoplasm of breast: Secondary | ICD-10-CM

## 2014-10-06 ENCOUNTER — Ambulatory Visit (INDEPENDENT_AMBULATORY_CARE_PROVIDER_SITE_OTHER): Payer: BC Managed Care – PPO | Admitting: Family Medicine

## 2014-10-06 ENCOUNTER — Encounter: Payer: Self-pay | Admitting: Family Medicine

## 2014-10-06 VITALS — BP 112/80 | HR 65 | Temp 98.4°F | Ht 61.0 in | Wt 143.0 lb

## 2014-10-06 DIAGNOSIS — Z79899 Other long term (current) drug therapy: Secondary | ICD-10-CM

## 2014-10-06 DIAGNOSIS — Z0000 Encounter for general adult medical examination without abnormal findings: Secondary | ICD-10-CM | POA: Diagnosis not present

## 2014-10-06 DIAGNOSIS — Z87891 Personal history of nicotine dependence: Secondary | ICD-10-CM

## 2014-10-06 DIAGNOSIS — J4520 Mild intermittent asthma, uncomplicated: Secondary | ICD-10-CM

## 2014-10-06 DIAGNOSIS — F900 Attention-deficit hyperactivity disorder, predominantly inattentive type: Secondary | ICD-10-CM

## 2014-10-06 MED ORDER — METHYLPHENIDATE HCL ER 20 MG PO TBCR: 20.0000 mg | EXTENDED_RELEASE_TABLET | ORAL | Status: DC

## 2014-10-06 MED ORDER — ALBUTEROL SULFATE 108 (90 BASE) MCG/ACT IN AEPB: 1.0000 | INHALATION_SPRAY | Freq: Four times a day (QID) | RESPIRATORY_TRACT | Status: DC | PRN

## 2014-10-06 NOTE — Progress Notes (Signed)
Amber Reddish, MD Phone: [phone]  Subjective:  Patient presents today to establish care with me as their new primary care provider and for annual physical and for follow up chronic health problems. Patient was formerly a patient of Dr. Arnoldo Morale. Chief complaint-noted.   Former smoker 7.5 pack years quit 2001. Ok to check urine.   Patient had a pap and gyn exam with ob/gyn last summer- cannot remember specific date but did not have bloodwork and requests CPE and bloodwork.   Walks regularly  Is vegetarian  See problem oriented charting  ROS- no chest pain. Occasional wheeze and shortness of breath relieved with albuterol. No chest pain,  unintentional weight loss.   The following were reviewed and entered/updated in epic: Past Medical History  Diagnosis Date  . ATTENTION DEFICIT DISORDER, ADULT 05/14/2007  . ALLERGIC RHINITIS 02/02/2007  . ASTHMA 02/02/2007  . GERD 05/14/2007    symptoms resolved when became vegetarian   Patient Active Problem List   Diagnosis Date Noted  . ADHD (attention deficit hyperactivity disorder) 06/09/2014    Priority: Medium  . Asthma 02/02/2007    Priority: Medium  . Former smoker 10/06/2014    Priority: Low  . Allergic rhinitis 02/02/2007    Priority: Low   Past Surgical History  Procedure Laterality Date  . Eye surgery      esotropia  . Leep      97-no abnormal paps since  . Breast reduction surgery      age 46- 6 lbs of tissue    Family History  Problem Relation Age of Onset  . Arthritis Mother   . Stomach cancer Father     86s  . Other Father     menetrier disease- massive gastric folds    Medications- reviewed and updated Current Outpatient Prescriptions  Medication Sig Dispense Refill  . Calcium Citrate Malate-Vit D (CALCIUM + D) 250-100 MG-UNIT TABS Take by mouth daily.      . methylphenidate (METADATE ER) 20 MG ER tablet Take 1 tablet (20 mg total) by mouth every morning. 90 tablet 0  . Multiple Vitamin  (MULTIVITAMIN) capsule Take 1 capsule by mouth daily.      Marland Kitchen albuterol (PROAIR HFA) 108 (90 BASE) MCG/ACT inhaler Inhale 2 puffs into the lungs 2 (two) times daily as needed for wheezing. (Patient not taking: Reported on 10/06/2014) 8.5 g 11  . desonide (DESOWEN) 0.05 % lotion Apply topically 2 (two) times daily. (Patient not taking: Reported on 10/06/2014) 59 mL 0   No current facility-administered medications for this visit.    Allergies-reviewed and updated No Known Allergies  History   Social History  . Marital Status: Single    Spouse Name: Amber Elliott  . Number of Children: Amber Elliott  . Years of Education: Amber Elliott   Social History Main Topics  . Smoking status: Former Smoker -- 0.75 packs/day for 10 years    Types: Cigarettes    Quit date: 05/17/1999  . Smokeless tobacco: Not on file  . Alcohol Use: 0.0 oz/week    0 Standard drinks or equivalent per week     Comment: 1-2 glasses wine per month  . Drug Use: No  . Sexual Activity: Yes   Other Topics Concern  . None   Social History Narrative   Single. No children.       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       Hobbies: reading, walks    ROS--See HPI  Objective: BP 112/80 mmHg  Pulse 65  Temp(Src) 98.4 F (36.9 C)  Ht 5\' 1"  (1.549 m)  Wt 143 lb (64.864 kg)  BMI 27.03 kg/m2 Gen: NAD, resting comfortably HEENT: Mucous membranes are moist. Oropharynx normal Neck: no thyromegaly CV: RRR no murmurs rubs or gallops Lungs: CTAB no crackles, wheeze, rhonchi Abdomen: soft/nontender/nondistended/normal bowel sounds. No rebound or guarding.  GYN and breast declined as gets through ob/gyn Ext: no edema Skin: warm, dry, 1.1 x 1 cm slightly raised light Haught lesion on left low back (no change in 20 years per patient- there since birth she believes) Neuro: grossly normal, moves all extremities, PERRLA  Assessment/Plan:  39 y.o. female presenting for annual  physical.  Health Maintenance counseling: 1. Anticipatory guidance: Patient counseled regarding regular dental exams, wearing seatbelts, wear sunscreen. Does not see dermatology.  2. Risk factor reduction:  Advised patient of need for regular exercise (walks regularly) and diet rich and fruits and vegetables  (vegetarian) to reduce risk of heart attack and stroke.  3. Immunizations/screenings/ancillary studies Health Maintenance Due  Topic Date Due  . PAP SMEAR - up to date, obtaining records 06/14/1992     Asthma S:No controller. Controlled with Albuterol once a month. A/P: mild intermittent asthma- continue current management    ADHD (attention deficit hyperactivity disorder) S: patient has done very well with methylphenidate 20mg  ER. Helps her complete tasks at work and if she has a lot of work to do on weekend. Sleeps better on days when she takes medication A/P: continue current rx, ok to hold dose on weekend if desired. 3 month phone in, 6 month in person visit.     6 month ADD follow up, 1 year CPE  Future fasting labs Orders Placed This Encounter  Procedures  . CBC    [place]    Standing Status: Future     Number of Occurrences:      Standing Expiration Date: 10/06/2015  . Comprehensive metabolic panel    [place]    Standing Status: Future     Number of Occurrences:      Standing Expiration Date: 10/06/2015    Order Specific Question:  Has the patient fasted?    Answer:  No  . Lipid panel    [place]    Standing Status: Future     Number of Occurrences:      Standing Expiration Date: 10/06/2015    Order Specific Question:  Has the patient fasted?    Answer:  No  . TSH    [place]    Standing Status: Future     Number of Occurrences:      Standing Expiration Date: 10/06/2015  . POCT urinalysis dipstick    In house    Standing Status: Future     Number of Occurrences:      Standing Expiration Date: 10/06/2015    Meds ordered this encounter  Medications  .  methylphenidate (METADATE ER) 20 MG ER tablet    Sig: Take 1 tablet (20 mg total) by mouth every morning.    Dispense:  90 tablet    Refill:  0  . Albuterol Sulfate (PROAIR RESPICLICK) 503 (90 BASE) MCG/ACT AEPB    Sig: Inhale 1 puff into the lungs every 6 (six) hours as needed.    Dispense:  1 each    Refill:  0

## 2014-10-06 NOTE — Assessment & Plan Note (Signed)
S:No controller. Controlled with Albuterol once a month. A/P: mild intermittent asthma- continue current management

## 2014-10-06 NOTE — Patient Instructions (Addendum)
Have pap results faxed to [place] at [phone].  Schedule a lab visit at the front desk. Return for future fasting labs- Nothing but water after midnight please.   3 months ADD meds given- can call in 3 months for refill,but have to see [place] every 6 months

## 2014-10-06 NOTE — Assessment & Plan Note (Signed)
S: patient has done very well with methylphenidate 20mg  ER. Helps her complete tasks at work and if she has a lot of work to do on weekend. Sleeps better on days when she takes medication A/P: continue current rx, ok to hold dose on weekend if desired. 3 month phone in, 6 month in person visit.

## 2014-10-28 ENCOUNTER — Other Ambulatory Visit (INDEPENDENT_AMBULATORY_CARE_PROVIDER_SITE_OTHER): Payer: BC Managed Care – PPO

## 2014-10-28 DIAGNOSIS — Z0000 Encounter for general adult medical examination without abnormal findings: Secondary | ICD-10-CM

## 2014-10-28 DIAGNOSIS — Z79899 Other long term (current) drug therapy: Secondary | ICD-10-CM

## 2014-10-28 LAB — LIPID PANEL
CHOL/HDL RATIO: 3
Cholesterol: 147 mg/dL (ref 0–200)
HDL: 55.1 mg/dL (ref >39.00)
LDL Cholesterol: 78 mg/dL (ref 0–99)
NonHDL: 91.9
Triglycerides: 72 mg/dL (ref 0.0–149.0)
VLDL: 14.4 mg/dL (ref 0.0–40.0)

## 2014-10-28 LAB — COMPREHENSIVE METABOLIC PANEL
ALT: 12 U/L (ref 0–35)
AST: 19 U/L (ref 0–37)
Albumin: 4.3 g/dL (ref 3.5–5.2)
Alkaline Phosphatase: 31 U/L — ABNORMAL LOW (ref 39–117)
BUN: 12 mg/dL (ref 6–23)
CO2: 26 meq/L (ref 19–32)
CREATININE: 0.79 mg/dL (ref 0.40–1.20)
Calcium: 9.3 mg/dL (ref 8.4–10.5)
Chloride: 105 mEq/L (ref 96–112)
GFR: 85.51 mL/min (ref >60.00)
Glucose, Bld: 92 mg/dL (ref 70–99)
POTASSIUM: 4.3 meq/L (ref 3.5–5.1)
Sodium: 137 mEq/L (ref 135–145)
Total Bilirubin: 0.7 mg/dL (ref 0.2–1.2)
Total Protein: 6.5 g/dL (ref 6.0–8.3)

## 2014-10-28 LAB — CBC
HEMATOCRIT: 40.2 % (ref 36.0–46.0)
HEMOGLOBIN: 13.5 g/dL (ref 12.0–15.0)
MCHC: 33.5 g/dL (ref 30.0–36.0)
MCV: 91.7 fl (ref 78.0–100.0)
PLATELETS: 227 10*3/uL (ref 150.0–400.0)
RBC: 4.38 Mil/uL (ref 3.87–5.11)
RDW: 12.9 % (ref 11.5–15.5)
WBC: 5.2 10*3/uL (ref 4.0–10.5)

## 2014-10-28 LAB — TSH: TSH: 1.75 u[IU]/mL (ref 0.35–4.50)

## 2014-11-03 ENCOUNTER — Encounter: Payer: Self-pay | Admitting: Family Medicine

## 2014-11-24 ENCOUNTER — Ambulatory Visit (INDEPENDENT_AMBULATORY_CARE_PROVIDER_SITE_OTHER): Payer: BC Managed Care – PPO | Admitting: Family Medicine

## 2014-11-24 ENCOUNTER — Encounter: Payer: Self-pay | Admitting: Family Medicine

## 2014-11-24 VITALS — BP 118/78 | HR 69 | Temp 98.5°F | Wt 144.0 lb

## 2014-11-24 DIAGNOSIS — M546 Pain in thoracic spine: Secondary | ICD-10-CM | POA: Diagnosis not present

## 2014-11-24 NOTE — Progress Notes (Signed)
Garret Reddish, MD  Subjective:  Amber Elliott is a 39 y.o. year old very pleasant female patient who presents with:  Thoracic Back Pain 2 months ago fell back on a corner of a wall after tripping backwards. Hit in the mid back. Now has had a pain in low back in midline. Constant annoying pain. 3/10 pulling sensation. Worse with arching back. Sitting up straight helps the back.  No radiation down legs. Numbness or tingling down into legs. Tried tylenol and sometimes heat without significant relief. No history of back pain. Pain 3 weeks. Pain centers likely Likely last few thoracic vertabra Previous imaging-never.   ROS-No saddle anesthesia, bladder incontinence, fecal incontinence, weakness in extremity, numbness or tingling in extremity. History negative for trauma other than falling backwards 5 weeks and having pain at higher level, history of cancer, fever, chills, unintentional weight loss, recent bacterial infection, recent IV drug use, HIV, pain worse at night or while supine. Plantar fascia on the right acting up as it has in the past- has had issues in past  Past Medical History- ADHD, asthma, allergic rhinitis  Medications- reviewed and updated Current Outpatient Prescriptions  Medication Sig Dispense Refill  . Calcium Citrate Malate-Vit D (CALCIUM + D) 250-100 MG-UNIT TABS Take by mouth daily.      . methylphenidate (METADATE ER) 20 MG ER tablet Take 1 tablet (20 mg total) by mouth every morning. 90 tablet 0  . Multiple Vitamin (MULTIVITAMIN) capsule Take 1 capsule by mouth daily.      Marland Kitchen albuterol (PROAIR HFA) 108 (90 BASE) MCG/ACT inhaler Inhale 2 puffs into the lungs 2 (two) times daily as needed for wheezing. (Patient not taking: Reported on 10/06/2014) 8.5 g 11  . Albuterol Sulfate (PROAIR RESPICLICK) 725 (90 BASE) MCG/ACT AEPB Inhale 1 puff into the lungs every 6 (six) hours as needed. (Patient not taking: Reported on 11/24/2014) 1 each 0  . desonide (DESOWEN) 0.05 % lotion Apply  topically 2 (two) times daily. (Patient not taking: Reported on 11/24/2014) 59 mL 0   Objective: BP 118/78 mmHg  Pulse 69  Temp(Src) 98.5 F (36.9 C)  Wt 144 lb (65.318 kg) Gen: NAD, resting comfortably Ext: no edema Skin: warm, dry, no rash Back - Normal skin, Spine with normal alignment and no deformity.  Patient does have pain with vertebral process palpation in several levels along thoracic spine most notably toward end of thoracic spine (most prominent likely t12).  Paraspinous muscles are mildly tender but usually less than midline corresponding area.  Range of motion is full at neck and lumbar sacral regions. Negative Straight leg raise. No pain with hip movement or FABER.  Neuro- no saddle anesthesia, 5/5 strength lower extremities, 2+ reflexes, normal gait, no saddle anesthesia  Assessment/Plan:  Thoracic Back Pain Patient does have worsened midline pain which does concern me but fall was 5 weeks prior and I doubt something like compression fracture in thoracic or lumbar area from falling against a corner against upper back. No other red flags.  Still do not like the midline pain over vertebrae. We opted to follow for 3-4 more weeks and if continued pain or worsens sooner than pursue lumbar and thoracic films.   Return precautions advised.

## 2014-11-24 NOTE — Patient Instructions (Addendum)
Let's watch this back discomfort for 3-4 more weeks. Should resolve in that time frame.   If it does not, we will get a picture of your lumbar and thoracic spine just to be sure

## 2015-01-12 ENCOUNTER — Telehealth: Payer: Self-pay | Admitting: Family Medicine

## 2015-01-12 NOTE — Telephone Encounter (Signed)
Pt needs new rx methylphenidate 20 mg

## 2015-01-12 NOTE — Telephone Encounter (Signed)
Refill ok? 

## 2015-01-12 NOTE — Telephone Encounter (Signed)
Yes thanks, fine to fill

## 2015-01-13 MED ORDER — METHYLPHENIDATE HCL ER 20 MG PO TBCR: 20.0000 mg | EXTENDED_RELEASE_TABLET | ORAL | Status: DC

## 2015-01-13 NOTE — Telephone Encounter (Signed)
Rx for pt upfront, pt notified.

## 2015-02-27 ENCOUNTER — Telehealth: Payer: Self-pay | Admitting: Family Medicine

## 2015-02-27 ENCOUNTER — Encounter: Payer: Self-pay | Admitting: Internal Medicine

## 2015-02-27 ENCOUNTER — Ambulatory Visit (INDEPENDENT_AMBULATORY_CARE_PROVIDER_SITE_OTHER): Payer: BC Managed Care – PPO | Admitting: Internal Medicine

## 2015-02-27 DIAGNOSIS — R350 Frequency of micturition: Secondary | ICD-10-CM | POA: Diagnosis not present

## 2015-02-27 DIAGNOSIS — Z23 Encounter for immunization: Secondary | ICD-10-CM

## 2015-02-27 DIAGNOSIS — R399 Unspecified symptoms and signs involving the genitourinary system: Secondary | ICD-10-CM | POA: Diagnosis not present

## 2015-02-27 DIAGNOSIS — R300 Dysuria: Secondary | ICD-10-CM | POA: Diagnosis not present

## 2015-02-27 LAB — POCT URINALYSIS DIPSTICK
Bilirubin, UA: NEGATIVE
Glucose, UA: NEGATIVE
Ketones, UA: NEGATIVE
Leukocytes, UA: NEGATIVE
NITRITE UA: NEGATIVE
PH UA: 6
Protein, UA: NEGATIVE
RBC UA: NEGATIVE
UROBILINOGEN UA: 0.2

## 2015-02-27 MED ORDER — SULFAMETHOXAZOLE-TRIMETHOPRIM 800-160 MG PO TABS: 1.0000 | ORAL_TABLET | Freq: Two times a day (BID) | ORAL | Status: DC

## 2015-02-27 NOTE — Patient Instructions (Signed)
Will do urine culture tests to help see what germ coud be causing your sx .  Treat for uti at this time .   Fu if  persistent or progressive  .

## 2015-02-27 NOTE — Telephone Encounter (Signed)
[facility]  Patient Name: Amber Elliott  DOB: 06-23-1975    Initial Comment Caller states she took Azo test strips this morning, positive for bladder infection.   Nurse Assessment  Nurse: Harlow Mares, RN, Suanne Marker Date/Time (Eastern Time): 02/27/2015 9:54:36 AM  Confirm and document reason for call. If symptomatic, describe symptoms. ---Caller states she took Azo test strips this morning, positive for bladder infection. Reports that she is having pain, urgency and frequency. Reports that she is having mild lower back pain on the both sides. Denies fever. + for nitrites. urine has been cloudy.  Has the patient traveled out of the country within the last 30 days? ---No  Does the patient have any new or worsening symptoms? ---Yes  Will a triage be completed? ---Yes  Related visit to physician within the last 2 weeks? ---No  Does the PT have any chronic conditions? (i.e. diabetes, asthma, etc.) ---No  Did the patient indicate they were pregnant? ---No     Guidelines    Guideline Title Affirmed Question Affirmed Notes       Final Disposition User        Comments  Patient scheduled for 2:15 pm appt with Dr. Regis Bill today.

## 2015-02-27 NOTE — Progress Notes (Signed)
Pre visit review using our clinic review tool, if applicable. No additional management support is needed unless otherwise documented below in the visit note.  Chief Complaint  Patient presents with  . Dysuria  . Urinary Urgency  . Back Pain    HPI: Patient Amber Elliott  comes in today for SDA for  new problem evaluation. PCP NA Onset days of lower abd pressure and after void her bladder doesn't feel empty .  No fever chills vomiting and   Last night  Back pain .  No blood  . Undomfortable. Feels like when she's had a UTI. No hematuria fever chills flank pain.  Last uti  Was about 2 months ago    [facility] .  Antibiotic for 5 days .  ?M acrobid . ? nbever back to normal but better.  No culture was done apparently Drinks lots of fluids this morning the Azo strip over-the-counter was bright positive has drank a lot of fluid since that time.  lmp 2 weeks ago . Condoms.  No other gi sx denies pregnancy. ROS: See pertinent positives and negatives per HPI. No fever chills change in bowel habits.  Past Medical History  Diagnosis Date  . ATTENTION DEFICIT DISORDER, ADULT 05/14/2007  . ALLERGIC RHINITIS 02/02/2007  . ASTHMA 02/02/2007  . GERD 05/14/2007    symptoms resolved when became vegetarian    Family History  Problem Relation Age of Onset  . Arthritis Mother   . Stomach cancer Father     3s  . Other Father     menetrier disease- massive gastric folds    Social History   Social History  . Marital Status: Single    Spouse Name: N/A  . Number of Children: N/A  . Years of Education: N/A   Social History Main Topics  . Smoking status: Former Smoker -- 0.75 packs/day for 10 years    Types: Cigarettes    Quit date: 05/17/1999  . Smokeless tobacco: Not on file  . Alcohol Use: 0.0 oz/week    0 Standard drinks or equivalent per week     Comment: 1-2 glasses wine per month  . Drug Use: No  . Sexual Activity: Yes   Other Topics Concern  . Not on file   Social  History Narrative   Single. No children.       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       Hobbies: reading, walks    Outpatient Prescriptions Prior to Visit  Medication Sig Dispense Refill  . albuterol (PROAIR HFA) 108 (90 BASE) MCG/ACT inhaler Inhale 2 puffs into the lungs 2 (two) times daily as needed for wheezing. 8.5 g 11  . Albuterol Sulfate (PROAIR RESPICLICK) 850 (90 BASE) MCG/ACT AEPB Inhale 1 puff into the lungs every 6 (six) hours as needed. 1 each 0  . Calcium Citrate Malate-Vit D (CALCIUM + D) 250-100 MG-UNIT TABS Take by mouth daily.      Marland Kitchen desonide (DESOWEN) 0.05 % lotion Apply topically 2 (two) times daily. 59 mL 0  . methylphenidate (METADATE ER) 20 MG ER tablet Take 1 tablet (20 mg total) by mouth every morning. 90 tablet 0  . Multiple Vitamin (MULTIVITAMIN) capsule Take 1 capsule by mouth daily.       No facility-administered medications prior to visit.     EXAM:  There were no vitals taken for this visit.  There is no weight on file to calculate BMI.  GENERAL: vitals reviewed and listed above, alert, oriented, appears well hydrated and in no acute distress  NECK: no obvious masses on inspection palpation  No flank pain abdomen soft without organomegaly guarding or rebound no suprapubic masses.  MS: moves all extremities without noticeable focal  abnormality PSYCH: pleasant and cooperative, no obvious depression or anxiety  ASSESSMENT AND PLAN:  Discussed the following assessment and plan:  Urinary frequency - poss uti context and sx pos azo strp this am   Lower urinary tract symptoms (LUTS)  Dysuria - Plan: POC Urinalysis Dipstick, Culture, Urine  Need for prophylactic vaccination and inoculation against influenza - Plan: Flu Vaccine QUAD 36+ mos PF IM (Fluarix & Fluzone Quad PF)  possible UTI despite negative dipstick based on her hydration status and past history. It is Friday we'll  do urine culture empiric treatment with Septra follow-up depending on clinical status and urine culture results. Avoid bladder irritants.  -Patient advised to return or notify health care team  if symptoms worsen ,persist or new concerns arise.  Patient Instructions  Will do urine culture tests to help see what germ coud be causing your sx .  Treat for uti at this time .   Fu if  persistent or progressive  .    Standley Brooking. Panosh M.D.

## 2015-02-27 NOTE — Progress Notes (Signed)
Document opened and reviewed for OV but appt  canceled same day . Seen at different time of day

## 2015-03-02 LAB — URINE CULTURE

## 2015-03-02 NOTE — Progress Notes (Signed)
Quick Note:  Tell patient that urine culture shows e coli sensitive to medication given . Should resolve with current treatment .FU if not better. ______ 

## 2015-03-04 ENCOUNTER — Telehealth: Payer: Self-pay | Admitting: Family Medicine

## 2015-03-04 ENCOUNTER — Ambulatory Visit (INDEPENDENT_AMBULATORY_CARE_PROVIDER_SITE_OTHER): Payer: BC Managed Care – PPO | Admitting: Family Medicine

## 2015-03-04 ENCOUNTER — Encounter: Payer: Self-pay | Admitting: Family Medicine

## 2015-03-04 VITALS — BP 122/70 | Temp 99.2°F | Wt 144.0 lb

## 2015-03-04 DIAGNOSIS — R109 Unspecified abdominal pain: Secondary | ICD-10-CM

## 2015-03-04 DIAGNOSIS — R1030 Lower abdominal pain, unspecified: Secondary | ICD-10-CM

## 2015-03-04 DIAGNOSIS — R358 Other polyuria: Secondary | ICD-10-CM

## 2015-03-04 DIAGNOSIS — R3589 Other polyuria: Secondary | ICD-10-CM

## 2015-03-04 NOTE — Telephone Encounter (Signed)
Patient Name: Amber Elliott DOB: [birth year] Initial Comment caller states she has UTI sx Nurse Assessment Nurse: Ronnald Ramp, RN, Miranda Date/Time (Eastern Time): 03/04/2015 10:11:52 AM Confirm and document reason for call. If symptomatic, describe symptoms. ---Caller states she was treated for UTI 1.5 months ago and symptoms never went away. They symptoms were worse last week and seen on Friday. UA was negative but the culture came back positive. She was treated for 3 days with Bactrim, and the symptoms improved. The symptoms started getting worse again yesterday afternoon. She is having urinary frequency and pressure/fullness in her bladder. Denies fever. Has the patient traveled out of the country within the last 30 days? ---Not Applicable Does the patient have any new or worsening symptoms? ---Yes Will a triage be completed? ---Yes Related visit to physician within the last 2 weeks? ---Yes Does the PT have any chronic conditions? (i.e. diabetes, asthma, etc.) ---No Did the patient indicate they were pregnant? ---No Guidelines Guideline Title Affirmed Question Affirmed Notes Urination Pain - Female > 2 UTI's in last year Final Disposition User See Physician within 24 Hours Ronnald Ramp, RN, Miranda Comments Appt scheduled at 3:45 today with Dr. Regis Bill. (no appt available with PCP) Referrals REFERRED TO PCP OFFICE Disagree/Comply: Comply

## 2015-03-04 NOTE — Patient Instructions (Signed)
Repeat culture  If positive treat for full 10 days. Then we would repeat culture for clearance about 4 days later

## 2015-03-04 NOTE — Progress Notes (Signed)
Garret Reddish, MD  Subjective:  Amber Elliott is a 39 y.o. year old very pleasant female patient who presents for/with See problem oriented charting ROS- No fever or vomiting. no nausea. No hematuria or dysuria.   Past Medical History-  Patient Active Problem List   Diagnosis Date Noted  . ADHD (attention deficit hyperactivity disorder) 06/09/2014    Priority: Medium  . Asthma 02/02/2007    Priority: Medium  . Former smoker 10/06/2014    Priority: Low  . Allergic rhinitis 02/02/2007    Priority: Low    Medications- reviewed and updated Current Outpatient Prescriptions  Medication Sig Dispense Refill  . Calcium Citrate Malate-Vit D (CALCIUM + D) 250-100 MG-UNIT TABS Take by mouth daily.      Marland Kitchen desonide (DESOWEN) 0.05 % lotion Apply topically 2 (two) times daily. 59 mL 0  . methylphenidate (METADATE ER) 20 MG ER tablet Take 1 tablet (20 mg total) by mouth every morning. 90 tablet 0  . Multiple Vitamin (MULTIVITAMIN) capsule Take 1 capsule by mouth daily.      Marland Kitchen albuterol (PROAIR HFA) 108 (90 BASE) MCG/ACT inhaler Inhale 2 puffs into the lungs 2 (two) times daily as needed for wheezing. (Patient not taking: Reported on 03/04/2015) 8.5 g 11  . Albuterol Sulfate (PROAIR RESPICLICK) 665 (90 BASE) MCG/ACT AEPB Inhale 1 puff into the lungs every 6 (six) hours as needed. (Patient not taking: Reported on 03/04/2015) 1 each 0   No current facility-administered medications for this visit.    Objective: BP 122/70 mmHg  Temp(Src) 99.2 F (37.3 C)  Wt 144 lb (65.318 kg)  LMP 02/13/2015 (Approximate) Gen: NAD, resting comfortably CV: RRR no murmurs rubs or gallops Lungs: CTAB no crackles, wheeze, rhonchi Abdomen: soft/Suprapubic discomfort moderate-otherwise nontender/nondistended/normal bowel sounds. No rebound or guarding.  Mild L flank pain Ext: no edema Skin: warm, dry Neuro: grossly normal, moves all extremities  Urine- negative glucose, negative bili, negative ketones, SG  1.015, blood negative, pH 5.5, protein negative, nitrites negative, leukocytes negative.   Of note, urine was normal last week yet culture grew bacteria.  Assessment/Plan:  Suprapubic pressure/urgency/incomplete voiding.  S:[facility] seen for symptoms of frequency, suprapubic tenderness. 5 days antibiotic- never got completely better but did help some. Probably macrodantin. Had some left flank tenderness that seemed to go. Saw Dr. Regis Bill last week and treated with bactrim after urine culture showed pan sensitive E. Coli. 3 day rx. Much improved on antibiotic, 90% better after finishing course. Yesterday symptoms returned- suprapubic pressure, frequency (but does drink a lot of water- still more frequent than normal). Incomplete emptying sensation. Some left flank pain intermittent again. Symptoms back to about 50% better from initial symptoms at this point.  A/P: nearly 2 months of symptoms, neither completely cleared by 5 day course of nitrofurantoin or 3 day course of bactrim. We will check another urine culture for clearance. If not clear- consider 10 days of keflex or cipro. Then repeat urine in 4 days. Could also consider urology referral in long run if no resolution.   Return precautions advised.   Orders Placed This Encounter  Procedures  . Culture, Urine

## 2015-03-06 LAB — URINE CULTURE
COLONY COUNT: NO GROWTH
Organism ID, Bacteria: NO GROWTH

## 2019-09-18 ENCOUNTER — Other Ambulatory Visit: Payer: Self-pay | Admitting: Family Medicine

## 2019-09-18 MED ORDER — PROAIR RESPICLICK 108 (90 BASE) MCG/ACT IN AEPB: 1.0000 | INHALATION_SPRAY | Freq: Four times a day (QID) | RESPIRATORY_TRACT | 0 refills | Status: DC | PRN

## 2019-09-18 NOTE — Telephone Encounter (Signed)
Please encourage her to schedule a visit. Once that has been done I will decline it

## 2019-09-18 NOTE — Telephone Encounter (Signed)
Forwarding to PCP CMA.

## 2019-09-19 NOTE — Telephone Encounter (Signed)
Patient has been scheduled for 10/21/19 to reestablish

## 2019-09-19 NOTE — Telephone Encounter (Signed)
I am okay with refilling any medications that are not controlled substances.  We will need to wait on the controlled substance until time of visit below-specifically methylphenidate

## 2019-09-19 NOTE — Telephone Encounter (Signed)
Patient has appointment to reestablish ok to fill?

## 2019-10-10 NOTE — Progress Notes (Addendum)
Phone: [phone]   Subjective:  Patient presents today to reestablish care- last seen by [place] in 2016.  Prior patient of here- had been cared for in [place] a few years.  Chief Complaint  Patient presents with  . Est Care and physical   Review of Systems  Constitutional: Negative for chills and fever.  HENT: Negative for hearing loss and tinnitus.   Eyes: Negative for blurred vision and double vision.  Respiratory: Negative for cough and wheezing.   Cardiovascular: Negative for chest pain.  Gastrointestinal: Negative for abdominal pain, heartburn, nausea and vomiting.  Genitourinary: Negative for dysuria and urgency.  Musculoskeletal: Negative for myalgias and neck pain.  Skin: Negative for itching and rash.  Neurological: Negative for dizziness and headaches.  Endo/Heme/Allergies: Negative for polydipsia. Does not bruise/bleed easily.  Psychiatric/Behavioral: Negative for depression, substance abuse and suicidal ideas.       Inattention/impulsivity Some binge eating tendencies   The following were reviewed and entered/updated in epic: Past Medical History:  Diagnosis Date  . ALLERGIC RHINITIS 02/02/2007  . ASTHMA 02/02/2007  . ATTENTION DEFICIT DISORDER, ADULT 05/14/2007  . GERD 05/14/2007   symptoms resolved when became vegetarian   Patient Active Problem List   Diagnosis Date Noted  . ADHD (attention deficit hyperactivity disorder) 06/09/2014    Priority: Medium  . Asthma 02/02/2007    Priority: Medium  . Former smoker 10/06/2014    Priority: Low  . Allergic rhinitis 02/02/2007    Priority: Low   Past Surgical History:  Procedure Laterality Date  . BREAST REDUCTION SURGERY     age 40- 6 lbs of tissue  . EYE SURGERY     esotropia  . LEEP     89-no abnormal paps since  . TONSILLECTOMY     1996 or 1997    Family History  Problem Relation Age of Onset  . Arthritis Mother   . AAA (abdominal aortic aneurysm) Mother        s/p repair in 21s  . Other  Mother        atherosclerosis  . Stomach cancer Father        63s. led to his death at 29.   . Other Father        menetrier disease- massive gastric folds  . Diabetes Half-Brother   . Other Half-Brother        unknown cause of death  . Healthy Half-Brother   . Healthy Half-Brother   . Drug abuse Half-Sister        long term issues   . Heart failure Maternal Grandmother        97 diagnosed with this  . Alcohol abuse Paternal Grandmother     Medications- reviewed and updated Current Outpatient Medications  Medication Sig Dispense Refill  . albuterol (PROAIR HFA) 108 (90 BASE) MCG/ACT inhaler Inhale 2 puffs into the lungs 2 (two) times daily as needed for wheezing. (Patient not taking: Reported on 03/04/2015) 8.5 g 11  . Albuterol Sulfate (PROAIR RESPICLICK) 123XX123 (90 Base) MCG/ACT AEPB Inhale 1 puff into the lungs every 6 (six) hours as needed. 1 each 5  . Calcium Citrate Malate-Vit D (CALCIUM + D) 250-100 MG-UNIT TABS Take by mouth daily.      Marland Kitchen lisdexamfetamine (VYVANSE) 20 MG capsule Take 1 capsule (20 mg total) by mouth daily before breakfast. 30 capsule 0  . [START ON 11/20/2019] lisdexamfetamine (VYVANSE) 20 MG capsule Take 1 capsule (20 mg total) by mouth daily before breakfast. 30 capsule 0  . [  START ON 12/20/2019] lisdexamfetamine (VYVANSE) 20 MG capsule Take 1 capsule (20 mg total) by mouth daily before breakfast. 30 capsule 0  . Multiple Vitamin (MULTIVITAMIN) capsule Take 1 capsule by mouth daily.       No current facility-administered medications for this visit.    Allergies-reviewed and updated No Known Allergies  Social History   Social History Narrative   Single- lives alone but close to mom. No children.       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    Moved to [place] for a few years- but moved back dec 2020 to [place] to help mom with AAA surgery.       Hobbies: reading, walks     Objective  Objective:  BP 120/60   Pulse 86   Temp 98 F (36.7 C)   Ht 5\' 1"  (1.549 m)   Wt 160 lb 9.6 oz (72.8 kg)   SpO2 97%   BMI 30.35 kg/m  Gen: NAD, resting comfortably HEENT: Mucous membranes are moist. Oropharynx normal Neck: no thyromegaly CV: RRR no murmurs rubs or gallops Lungs: CTAB no crackles, wheeze, rhonchi Abdomen: soft/nontender/nondistended/normal bowel sounds. No rebound or guarding.  Ext: no edema Skin: warm, dry Neuro: grossly normal, moves all extremities, PERRLA   Assessment and Plan   44 y.o. female presenting for annual physical.  Health Maintenance counseling: 1. Anticipatory guidance: Patient counseled regarding regular dental exams -q6 months, eye exams - yearly,  avoiding smoking and second hand smoke , limiting alcohol to 1 beverage per day- rare social .   2. Risk factor reduction:  Advised patient of need for regular exercise and diet rich and fruits and vegetables to reduce risk of heart attack and stroke. Exercise- 3-4 days a week walking or yoga or lift weights. Diet-feels like with ADD poorly controlled has more impulsivity/tendency to binge and habits have not been as good as previous off  meds.  Wt Readings from Last 3 Encounters:  10/21/19 160 lb 9.6 oz (72.8 kg)  03/04/15 144 lb (65.3 kg)  11/24/14 144 lb (65.3 kg)  3. Immunizations/screenings/ancillary studies- up to date, she will let us know when had covid 19 vaccinations Immunization History  Administered Date(s) Administered  . Influenza Split 02/17/2012  . Influenza Whole 03/12/2007, 02/14/2008  . Influenza,inj,Quad PF,6+ Mos 05/13/2013, 05/05/2014, 02/27/2015  . PFIZER SARS-COV-2 Vaccination 06/21/2019, 07/12/2019  . Pneumococcal Polysaccharide-23 02/06/2008  . Td 06/04/2010  . Varicella 12/21/2012, 01/23/2013  4. Cervical cancer screening-  [place] 6 [place]- [place]. She may reestablish with GYN 5. Breast cancer screening-  breast exam with PCP  last check and mammogram - within 18 months- going to get copy and recommended update. Refer back locally 6. Colon cancer screening - will refer as age 65 7. Skin cancer screening- no dermatologist. advised regular sunscreen use. Denies worrisome, changing, or new skin lesions- area on her back that hasnt changed.  8. Birth control/STD check- not dating. Has never had unprotected sex and then not been tested. gyn [place] 1.5 years ago.  menstruation age 37 or 15. no menoapuse. regular periods. Not active at the moment 9. Osteoporosis screening at 81- will plan on this- former smoker slightly increases risk -former smoker- no regular screening required.   Status of chronic or acute concerns     # ADD S: Diagnosed in high school as well as college. Has copy and can drop off  college evaluation.  Methylphenidate 20 mg extended release previously used and was beneficial.  She has now been off for several years and notes increased inattention and impulsivity-impulsivity bleeds over into eating and has some impulse control and at times binge eating which seems to be adding to weight gain. A/P: Poor control  Of adult ADD.  We will trial Vyvanse instead of methylphenidate due to binge eating portion of current symptoms.  Start with 20 mg.  Sent in 33-month supply-if working really well we can refill for an additional 3 months-if not well controlled she will schedule follow-up and we can consider an increase to higher dose -Our team will help complete controlled substance contract next visit- current staff was not trained on how to do so -PDMP reviewed and no high risk medication utilization noted-infection he has no refills on this in the last several years or any other controlled substances -UDS today per standard protocol- next visit as our lab orders have changed and I could not locate basic UDS -she will try to drop off copy of ADD evaluation  Recommended follow up: Return in about 6 months (around  04/21/2020) for follow up- or sooner if needed.  Lab/Order associations:  Fasting other than coffee   ICD-10-CM   1. Preventative health care  Z00.00 CBC with Differential/Platelet    Comprehensive metabolic panel    Lipid panel    POCT Urinalysis Dipstick (Automated)  2. Encounter for screening mammogram for malignant neoplasm of breast  Z12.31 MM Digital Screening  3. Screen for colon cancer  Z12.11 Ambulatory referral to Gastroenterology  4. Screening for hyperlipidemia  Z13.220 Lipid panel  5. Attention deficit disorder (ADD) in adult  F98.8 CBC with Differential/Platelet    Comprehensive metabolic panel  6. High risk medication use  Z79.899   7. Former smoker  Z87.891 POCT Urinalysis Dipstick (Automated)    Meds ordered this encounter  Medications  . Albuterol Sulfate (PROAIR RESPICLICK) 123XX123 (90 Base) MCG/ACT AEPB    Sig: Inhale 1 puff into the lungs every 6 (six) hours as needed.    Dispense:  1 each    Refill:  5  . lisdexamfetamine (VYVANSE) 20 MG capsule    Sig: Take 1 capsule (20 mg total) by mouth daily before breakfast.    Dispense:  30 capsule    Refill:  0  . lisdexamfetamine (VYVANSE) 20 MG capsule    Sig: Take 1 capsule (20 mg total) by mouth daily before breakfast.    Dispense:  30 capsule    Refill:  0  . lisdexamfetamine (VYVANSE) 20 MG capsule    Sig: Take 1 capsule (20 mg total) by mouth daily before breakfast.    Dispense:  30 capsule    Refill:  0   Return precautions advised.  Garret Reddish, MD

## 2019-10-10 NOTE — Patient Instructions (Addendum)
°  Health Maintenance Due  Topic Date Due   PAP SMEAR-Sign release of information at the check out desk for last pap  Never done   MAMMOGRAM - Sign release of information at the check out desk for last mammogram  We will call you within two weeks about your referral for mammogram with breast center- you can give them a call directly If you do not hear within 3 weeks 08/15/2015   We will call you within two weeks about your referral to GI for colonoscopy (make sure that they know your insurance covers this at age 74). If you do not hear within 3 weeks, give [place] a call.    Stop by the desk and schedule mammogram.  Team please set up controlled substance contract  Trial vyvanse 20mg - if not giving you the amount of relief you need please let me know- trial for at least a month if no significant side effects  Please stop by lab before you go If you have mychart- we will send your results within 3 business days of [place] receiving them.  If you do not have mychart- we will call you about results within 5 business days of [place] receiving them.

## 2019-10-21 ENCOUNTER — Encounter: Payer: Self-pay | Admitting: Family Medicine

## 2019-10-21 ENCOUNTER — Ambulatory Visit (INDEPENDENT_AMBULATORY_CARE_PROVIDER_SITE_OTHER): Payer: BC Managed Care – PPO | Admitting: Family Medicine

## 2019-10-21 ENCOUNTER — Other Ambulatory Visit: Payer: Self-pay

## 2019-10-21 VITALS — BP 120/60 | HR 86 | Temp 98.0°F | Ht 61.0 in | Wt 160.6 lb

## 2019-10-21 DIAGNOSIS — Z0000 Encounter for general adult medical examination without abnormal findings: Secondary | ICD-10-CM

## 2019-10-21 DIAGNOSIS — Z79899 Other long term (current) drug therapy: Secondary | ICD-10-CM

## 2019-10-21 DIAGNOSIS — Z1211 Encounter for screening for malignant neoplasm of colon: Secondary | ICD-10-CM | POA: Diagnosis not present

## 2019-10-21 DIAGNOSIS — Z1231 Encounter for screening mammogram for malignant neoplasm of breast: Secondary | ICD-10-CM | POA: Diagnosis not present

## 2019-10-21 DIAGNOSIS — Z13220 Encounter for screening for lipoid disorders: Secondary | ICD-10-CM

## 2019-10-21 DIAGNOSIS — Z87891 Personal history of nicotine dependence: Secondary | ICD-10-CM

## 2019-10-21 DIAGNOSIS — F988 Other specified behavioral and emotional disorders with onset usually occurring in childhood and adolescence: Secondary | ICD-10-CM

## 2019-10-21 DIAGNOSIS — F900 Attention-deficit hyperactivity disorder, predominantly inattentive type: Secondary | ICD-10-CM

## 2019-10-21 LAB — POC URINALSYSI DIPSTICK (AUTOMATED)
Bilirubin, UA: NEGATIVE
Blood, UA: NEGATIVE
Glucose, UA: NEGATIVE
Ketones, UA: NEGATIVE
Leukocytes, UA: NEGATIVE
Nitrite, UA: NEGATIVE
Protein, UA: NEGATIVE
Spec Grav, UA: >=1.03 — AB (ref 1.010–1.025)
Urobilinogen, UA: 0.2 E.U./dL
pH, UA: 5.5 (ref 5.0–8.0)

## 2019-10-21 MED ORDER — LISDEXAMFETAMINE DIMESYLATE 20 MG PO CAPS
20.0000 mg | ORAL_CAPSULE | Freq: Every day | ORAL | 0 refills | Status: DC
Start: 2019-11-20 — End: 2019-12-20

## 2019-10-21 MED ORDER — LISDEXAMFETAMINE DIMESYLATE 20 MG PO CAPS
20.0000 mg | ORAL_CAPSULE | Freq: Every day | ORAL | 0 refills | Status: DC
Start: 2019-12-20 — End: 2019-12-12

## 2019-10-21 MED ORDER — LISDEXAMFETAMINE DIMESYLATE 20 MG PO CAPS
20.0000 mg | ORAL_CAPSULE | Freq: Every day | ORAL | 0 refills | Status: DC
Start: 2019-10-21 — End: 2019-11-22

## 2019-10-21 MED ORDER — PROAIR RESPICLICK 108 (90 BASE) MCG/ACT IN AEPB: 1.0000 | INHALATION_SPRAY | Freq: Four times a day (QID) | RESPIRATORY_TRACT | 5 refills | Status: DC | PRN

## 2019-10-21 NOTE — Assessment & Plan Note (Signed)
#   ADD S: Diagnosed in high school as well as college. Has copy and can drop off college evaluation.  Methylphenidate 20 mg extended release previously used and was beneficial.  She has now been off for several years and notes increased inattention and impulsivity-impulsivity bleeds over into eating and has some impulse control and at times binge eating which seems to be adding to weight gain. A/P: Poor control  Of adult ADD.  We will trial Vyvanse instead of methylphenidate due to binge eating portion of current symptoms.  Start with 20 mg.  Sent in 32-month supply-if working really well we can refill for an additional 3 months-if not well controlled she will schedule follow-up and we can consider an increase to higher dose -Our team will help complete controlled substance contract next visit- current staff was not trained on how to do so -PDMP reviewed and no high risk medication utilization noted-infection he has no refills on this in the last several years or any other controlled substances -UDS today per standard protocol- next visit as our lab orders have changed and I could not locate basic UDS -she will try to drop off copy of ADD evaluation

## 2019-10-21 NOTE — Addendum Note (Signed)
Addended by: Betti Cruz on: 10/21/2019 02:40 PM   Modules accepted: Orders

## 2019-10-22 ENCOUNTER — Telehealth: Payer: Self-pay

## 2019-10-22 NOTE — Telephone Encounter (Signed)
Albuterol same directions as listed for ProAir Respiclick

## 2019-10-22 NOTE — Telephone Encounter (Signed)
Fax received Pro air Respiclick not covered would like to know if following would be alternative   Albuterol no prior auth needed  Ventolin will need auth Levalbuterol tartrate will need auth

## 2019-10-23 ENCOUNTER — Other Ambulatory Visit: Payer: Self-pay

## 2019-10-23 MED ORDER — ALBUTEROL SULFATE HFA 108 (90 BASE) MCG/ACT IN AERS: INHALATION_SPRAY | RESPIRATORY_TRACT | 1 refills | Status: DC

## 2019-10-23 NOTE — Telephone Encounter (Signed)
Verified per Dr. Yong Channel called in with 2 puffs ever 6 hrs as needed.

## 2019-11-01 ENCOUNTER — Encounter: Payer: Self-pay | Admitting: Family Medicine

## 2019-11-01 ENCOUNTER — Other Ambulatory Visit: Payer: BC Managed Care – PPO

## 2019-11-01 ENCOUNTER — Other Ambulatory Visit: Payer: Self-pay

## 2019-11-01 DIAGNOSIS — D649 Anemia, unspecified: Secondary | ICD-10-CM

## 2019-11-01 LAB — CBC WITH DIFFERENTIAL/PLATELET
Basophils Absolute: 0.1 10*3/uL (ref 0.0–0.1)
Basophils Relative: 1.6 % (ref 0.0–3.0)
Eosinophils Absolute: 0.2 10*3/uL (ref 0.0–0.7)
Eosinophils Relative: 4.6 % (ref 0.0–5.0)
HCT: 35.5 % — ABNORMAL LOW (ref 36.0–46.0)
Hemoglobin: 11.7 g/dL — ABNORMAL LOW (ref 12.0–15.0)
Lymphocytes Relative: 49.3 % — ABNORMAL HIGH (ref 12.0–46.0)
Lymphs Abs: 1.8 10*3/uL (ref 0.7–4.0)
MCHC: 32.8 g/dL (ref 30.0–36.0)
MCV: 79 fl (ref 78.0–100.0)
Monocytes Absolute: 0.3 10*3/uL (ref 0.1–1.0)
Monocytes Relative: 7.6 % (ref 3.0–12.0)
Neutro Abs: 1.3 10*3/uL — ABNORMAL LOW (ref 1.4–7.7)
Neutrophils Relative %: 36.9 % — ABNORMAL LOW (ref 43.0–77.0)
Platelets: 280 10*3/uL (ref 150.0–400.0)
RBC: 4.5 Mil/uL (ref 3.87–5.11)
RDW: 16.6 % — ABNORMAL HIGH (ref 11.5–15.5)
WBC: 3.5 10*3/uL — ABNORMAL LOW (ref 4.0–10.5)

## 2019-11-01 LAB — COMPREHENSIVE METABOLIC PANEL
ALT: 10 U/L (ref 0–35)
AST: 15 U/L (ref 0–37)
Albumin: 4.2 g/dL (ref 3.5–5.2)
Alkaline Phosphatase: 35 U/L — ABNORMAL LOW (ref 39–117)
BUN: 10 mg/dL (ref 6–23)
CO2: 26 mEq/L (ref 19–32)
Calcium: 9.1 mg/dL (ref 8.4–10.5)
Chloride: 106 mEq/L (ref 96–112)
Creatinine, Ser: 0.75 mg/dL (ref 0.40–1.20)
GFR: 83.42 mL/min (ref >60.00)
Glucose, Bld: 85 mg/dL (ref 70–99)
Potassium: 4.3 mEq/L (ref 3.5–5.1)
Sodium: 139 mEq/L (ref 135–145)
Total Bilirubin: 0.5 mg/dL (ref 0.2–1.2)
Total Protein: 6.2 g/dL (ref 6.0–8.3)

## 2019-11-01 LAB — LIPID PANEL
Cholesterol: 151 mg/dL (ref 0–200)
HDL: 56.8 mg/dL (ref >39.00)
LDL Cholesterol: 85 mg/dL (ref 0–99)
NonHDL: 94.26
Total CHOL/HDL Ratio: 3
Triglycerides: 46 mg/dL (ref 0.0–149.0)
VLDL: 9.2 mg/dL (ref 0.0–40.0)

## 2019-11-06 NOTE — Telephone Encounter (Signed)
Called and LVM for pt to schedule lab appt.

## 2019-11-22 ENCOUNTER — Other Ambulatory Visit: Payer: Self-pay | Admitting: Family Medicine

## 2019-11-22 MED ORDER — LISDEXAMFETAMINE DIMESYLATE 20 MG PO CAPS: 20.0000 mg | ORAL_CAPSULE | Freq: Every day | ORAL | 0 refills | Status: DC

## 2019-11-22 NOTE — Telephone Encounter (Signed)
Pt requesting refills.

## 2019-11-29 ENCOUNTER — Other Ambulatory Visit: Payer: Self-pay | Admitting: *Deleted

## 2019-11-29 ENCOUNTER — Other Ambulatory Visit: Payer: Self-pay

## 2019-11-29 ENCOUNTER — Other Ambulatory Visit: Payer: BC Managed Care – PPO

## 2019-11-29 DIAGNOSIS — D649 Anemia, unspecified: Secondary | ICD-10-CM

## 2019-11-29 DIAGNOSIS — Z1231 Encounter for screening mammogram for malignant neoplasm of breast: Secondary | ICD-10-CM

## 2019-11-29 LAB — CBC
HCT: 36 % (ref 35.0–45.0)
Hemoglobin: 11.4 g/dL — ABNORMAL LOW (ref 11.7–15.5)
MCH: 26.1 pg — ABNORMAL LOW (ref 27.0–33.0)
MCHC: 31.7 g/dL — ABNORMAL LOW (ref 32.0–36.0)
MCV: 82.4 fL (ref 80.0–100.0)
MPV: 10.4 fL (ref 7.5–12.5)
Platelets: 309 10*3/uL (ref 140–400)
RBC: 4.37 10*6/uL (ref 3.80–5.10)
RDW: 17 % — ABNORMAL HIGH (ref 11.0–15.0)
WBC: 6.1 10*3/uL (ref 3.8–10.8)

## 2019-12-03 LAB — TISSUE TRANSGLUTAMINASE, IGG: (tTG) Ab, IgG: 1 U/mL

## 2019-12-03 LAB — GLIADIN ANTIBODIES, SERUM
Gliadin IgA: 2 Units
Gliadin IgG: 1 Units

## 2019-12-03 LAB — TISSUE TRANSGLUTAMINASE, IGA: (tTG) Ab, IgA: 1 U/mL

## 2019-12-03 LAB — RETICULIN ANTIBODIES, IGA W TITER: Reticulin IGA Screen: NEGATIVE

## 2019-12-12 ENCOUNTER — Encounter: Payer: Self-pay | Admitting: Nurse Practitioner

## 2019-12-12 ENCOUNTER — Ambulatory Visit (INDEPENDENT_AMBULATORY_CARE_PROVIDER_SITE_OTHER): Payer: BC Managed Care – PPO | Admitting: Nurse Practitioner

## 2019-12-12 ENCOUNTER — Other Ambulatory Visit: Payer: Self-pay

## 2019-12-12 VITALS — BP 130/80 | Ht 61.0 in | Wt 152.0 lb

## 2019-12-12 DIAGNOSIS — D069 Carcinoma in situ of cervix, unspecified: Secondary | ICD-10-CM

## 2019-12-12 DIAGNOSIS — Z01419 Encounter for gynecological examination (general) (routine) without abnormal findings: Secondary | ICD-10-CM

## 2019-12-12 DIAGNOSIS — Z8541 Personal history of malignant neoplasm of cervix uteri: Secondary | ICD-10-CM

## 2019-12-12 NOTE — Patient Instructions (Signed)
Health Maintenance, Female Adopting a healthy lifestyle and getting preventive care are important in promoting health and wellness. Ask your health care provider about:  The right schedule for you to have regular tests and exams.  Things you can do on your own to prevent diseases and keep yourself healthy. What should I know about diet, weight, and exercise? Eat a healthy diet   Eat a diet that includes plenty of vegetables, fruits, low-fat dairy products, and lean protein.  Do not eat a lot of foods that are high in solid fats, added sugars, or sodium. Maintain a healthy weight Body mass index (BMI) is used to identify weight problems. It estimates body fat based on height and weight. Your health care provider can help determine your BMI and help you achieve or maintain a healthy weight. Get regular exercise Get regular exercise. This is one of the most important things you can do for your health. Most adults should:  Exercise for at least 150 minutes each week. The exercise should increase your heart rate and make you sweat (moderate-intensity exercise).  Do strengthening exercises at least twice a week. This is in addition to the moderate-intensity exercise.  Spend less time sitting. Even light physical activity can be beneficial. Watch cholesterol and blood lipids Have your blood tested for lipids and cholesterol at 45 years of age, then have this test every 5 years. Have your cholesterol levels checked more often if:  Your lipid or cholesterol levels are high.  You are older than 45 years of age.  You are at high risk for heart disease. What should I know about cancer screening? Depending on your health history and family history, you may need to have cancer screening at various ages. This may include screening for:  Breast cancer.  Cervical cancer.  Colorectal cancer.  Skin cancer.  Lung cancer. What should I know about heart disease, diabetes, and high blood  pressure? Blood pressure and heart disease  High blood pressure causes heart disease and increases the risk of stroke. This is more likely to develop in people who have high blood pressure readings, are of African descent, or are overweight.  Have your blood pressure checked: ? Every 3-5 years if you are 18-39 years of age. ? Every year if you are 40 years old or older. Diabetes Have regular diabetes screenings. This checks your fasting blood sugar level. Have the screening done:  Once every three years after age 40 if you are at a normal weight and have a low risk for diabetes.  More often and at a younger age if you are overweight or have a high risk for diabetes. What should I know about preventing infection? Hepatitis B If you have a higher risk for hepatitis B, you should be screened for this virus. Talk with your health care provider to find out if you are at risk for hepatitis B infection. Hepatitis C Testing is recommended for:  Everyone born from 1945 through 1965.  Anyone with known risk factors for hepatitis C. Sexually transmitted infections (STIs)  Get screened for STIs, including gonorrhea and chlamydia, if: ? You are sexually active and are younger than 45 years of age. ? You are older than 45 years of age and your health care provider tells you that you are at risk for this type of infection. ? Your sexual activity has changed since you were last screened, and you are at increased risk for chlamydia or gonorrhea. Ask your health care provider if   you are at risk.  Ask your health care provider about whether you are at high risk for HIV. Your health care provider may recommend a prescription medicine to help prevent HIV infection. If you choose to take medicine to prevent HIV, you should first get tested for HIV. You should then be tested every 3 months for as long as you are taking the medicine. Pregnancy  If you are about to stop having your period (premenopausal) and  you may become pregnant, seek counseling before you get pregnant.  Take 400 to 800 micrograms (mcg) of folic acid every day if you become pregnant.  Ask for birth control (contraception) if you want to prevent pregnancy. Osteoporosis and menopause Osteoporosis is a disease in which the bones lose minerals and strength with aging. This can result in bone fractures. If you are 65 years old or older, or if you are at risk for osteoporosis and fractures, ask your health care provider if you should:  Be screened for bone loss.  Take a calcium or vitamin D supplement to lower your risk of fractures.  Be given hormone replacement therapy (HRT) to treat symptoms of menopause. Follow these instructions at home: Lifestyle  Do not use any products that contain nicotine or tobacco, such as cigarettes, e-cigarettes, and chewing tobacco. If you need help quitting, ask your health care provider.  Do not use street drugs.  Do not share needles.  Ask your health care provider for help if you need support or information about quitting drugs. Alcohol use  Do not drink alcohol if: ? Your health care provider tells you not to drink. ? You are pregnant, may be pregnant, or are planning to become pregnant.  If you drink alcohol: ? Limit how much you use to 0-1 drink a day. ? Limit intake if you are breastfeeding.  Be aware of how much alcohol is in your drink. In the [place] one drink equals one 12 oz bottle of beer (355 mL), one 5 oz glass of wine (148 mL), or one 1 oz glass of hard liquor (44 mL). General instructions  Schedule regular health, dental, and eye exams.  Stay current with your vaccines.  Tell your health care provider if: ? You often feel depressed. ? You have ever been abused or do not feel safe at home. Summary  Adopting a healthy lifestyle and getting preventive care are important in promoting health and wellness.  Follow your health care provider's instructions about healthy  diet, exercising, and getting tested or screened for diseases.  Follow your health care provider's instructions on monitoring your cholesterol and blood pressure. This information is not intended to replace advice given to you by your health care provider. Make sure you discuss any questions you have with your health care provider. Document Revised: 04/25/2018 Document Reviewed: 04/25/2018 Elsevier Patient Education  2020 Elsevier Inc.  

## 2019-12-12 NOTE — Progress Notes (Signed)
   FREDIA CHITTENDEN 1975/08/27 [id number]   History:  44 y.o. G1P0010 presents as new patient to establish care without GYN complaints. 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 Normal mammogram history, overdue but scheduled for next month. Breast reduction. Recently established with a PCP. [place]   Gynecologic History Patient's last menstrual period was 11/18/2019. Period Cycle (Days): 28 Period Duration (Days): 5 Period Pattern: Regular Menstrual Flow: Moderate Menstrual Control: Maxi pad, Tampon Dysmenorrhea: (!) Mild Dysmenorrhea Symptoms: Cramping Contraception: none Last Pap: 07/2017. Results were: normal Last mammogram: 07/2017. Results were: normal   Past medical history, past surgical history, family history and social history were all reviewed and documented in the EPIC chart.  ROS:  A ROS was performed and pertinent positives and negatives are included.  Exam:  Vitals:   12/12/19 1553  BP: (!) 130/80  Weight: 152 lb (68.9 kg)  Height: 5\' 1"  (1.549 m)   Body mass index is 28.72 kg/m.  General appearance:  Normal Thyroid:  Symmetrical, normal in size, without palpable masses or nodularity. Respiratory  Auscultation:  Clear without wheezing or rhonchi Cardiovascular  Auscultation:  Regular rate, without rubs, murmurs or gallops  Edema/varicosities:  Not grossly evident Abdominal  Soft,nontender, without masses, guarding or rebound.  Liver/spleen:  No organomegaly noted  Hernia:  None appreciated  Skin  Inspection:  Grossly normal   Breasts: Examined lying and sitting.   Right: Without masses, retractions, discharge or axillary adenopathy.   Left: Without masses, retractions, discharge or axillary adenopathy. Gentitourinary   Inguinal/mons:  Normal without inguinal adenopathy  External genitalia:  Normal  BUS/Urethra/Skene's glands:  Normal  Vagina:  Normal  Cervix:  Normal  Uterus:   Anteverted, normal in size, shape and contour.  Midline and mobile  Adnexa/parametria:     Rt: Without masses or tenderness.   Lt: Without masses or tenderness.  Anus and perineum: Normal   Assessment/Plan:  44 y.o. G1P0010 as new patient.   Well female exam with routine gynecological exam - Education provided on SBEs, importance of preventative screenings, current guidelines, high calcium diet, regular exercise, and multivitamin daily.   High grade squamous intraepithelial lesion (HGSIL), grade 3 CIN, on biopsy of cervix - LEEP in her 20s, subsequent paps normal. Pap with HR HPV typing today.  Follow up in 1 year for annual.      Tamela Gammon [facility], 4:00 PM 12/12/2019

## 2019-12-13 ENCOUNTER — Encounter: Payer: Self-pay | Admitting: Family Medicine

## 2019-12-13 ENCOUNTER — Ambulatory Visit (INDEPENDENT_AMBULATORY_CARE_PROVIDER_SITE_OTHER): Payer: BC Managed Care – PPO | Admitting: Family Medicine

## 2019-12-13 ENCOUNTER — Ambulatory Visit: Payer: BC Managed Care – PPO

## 2019-12-13 VITALS — BP 142/80 | HR 80 | Temp 99.5°F | Ht 61.0 in | Wt 153.2 lb

## 2019-12-13 DIAGNOSIS — L0291 Cutaneous abscess, unspecified: Secondary | ICD-10-CM | POA: Diagnosis not present

## 2019-12-13 LAB — PAP, TP IMAGING W/ HPV RNA, RFLX HPV TYPE 16,18/45: HPV DNA High Risk: NOT DETECTED

## 2019-12-13 MED ORDER — DOXYCYCLINE HYCLATE 100 MG PO TABS: 100.0000 mg | ORAL_TABLET | Freq: Two times a day (BID) | ORAL | 0 refills | Status: DC

## 2019-12-13 NOTE — Progress Notes (Signed)
   Amber Elliott is a 44 y.o. female who presents today for an office visit.  Assessment/Plan:  New/Acute Problems: Abscess No red flags.  I&D performed today.  She tolerated well.  See below procedure note.  Can use over-the-counter analgesics as needed for pain.  We will send in pocket prescription for doxycycline with instruction to not start unless symptoms do not improve or she develops any systemic symptoms.    Subjective:  HPI:  Patient here with abscess in right armpit for a few days.  No fevers or chills.  She has otherwise been similarly she thinks in the past.  She shaves her armpits.  No other obvious precipitating events.       Objective:  Physical Exam: BP (!) 142/80   Pulse 80   Temp 99.5 F (37.5 C)   Ht 5\' 1"  (1.549 m)   Wt 153 lb 4 oz (69.5 kg)   LMP 11/18/2019   SpO2 96%   BMI 28.96 kg/m   Gen: No acute distress, resting comfortably CV: Regular rate and rhythm with no murmurs appreciated Pulm: Normal work of breathing, clear to auscultation bilaterally with no crackles, wheezes, or rhonchi Skin: Erythematous fluctuant lesion and right axilla approximately 2 cm in diameter. Neuro: Grossly normal, moves all extremities Psych: Normal affect and thought content  Incision and Drainage Procedure Note  Pre-operative Diagnosis: Abscess  Post-operative Diagnosis: normal, same  Indications: Therapeutic  Anesthesia: 1% lidocaine with epinephrine  Procedure Details  The procedure, risks and complications have been discussed in detail (including, but not limited to airway compromise, infection, bleeding) with the patient, and the patient has signed consent to the procedure.  The skin was sterilely prepped and draped over the affected area in the usual fashion. After adequate local anesthesia, I&D with a #11 blade was performed on the right axilla. Purulent drainage: present The patient was observed until stable.  Findings: Abscess  EBL: 2  cc's  Drains: None  Condition: Tolerated procedure well  Complications: none.       Algis Greenhouse. Jerline Pain, MD 12/13/2019 11:52 AM

## 2019-12-13 NOTE — Patient Instructions (Signed)
Incision and Drainage, Care After This sheet gives you information about how to care for yourself after your procedure. Your health care provider may also give you more specific instructions. If you have problems or questions, contact your health care provider. What can I expect after the procedure? After the procedure, it is common to have:  Pain or discomfort around the incision site.  Blood, fluid, or pus (drainage) from the incision.  Redness and firm skin around the incision site. Follow these instructions at home: Medicines  Take over-the-counter and prescription medicines only as told by your health care provider.  If you were prescribed an antibiotic medicine, use or take it as told by your health care provider. Do not stop using the antibiotic even if you start to feel better. Wound care Follow instructions from your health care provider about how to take care of your wound. Make sure you:  Wash your hands with soap and water before and after you change your bandage (dressing). If soap and water are not available, use hand sanitizer.  Change your dressing and packing as told by your health care provider. ? If your dressing is dry or stuck when you try to remove it, moisten or wet the dressing with saline or water so that it can be removed without harming your skin or tissues. ? If your wound is packed, leave it in place until your health care provider tells you to remove it. To remove the packing, moisten or wet the packing with saline or water so that it can be removed without harming your skin or tissues.  Leave stitches (sutures), skin glue, or adhesive strips in place. These skin closures may need to stay in place for 2 weeks or longer. If adhesive strip edges start to loosen and curl up, you may trim the loose edges. Do not remove adhesive strips completely unless your health care provider tells you to do that. Check your wound every day for signs of infection. Check  for:  More redness, swelling, or pain.  More fluid or blood.  Warmth.  Pus or a bad smell. If you were sent home with a drain tube in place, follow instructions from your health care provider about:  How to empty it.  How to care for it at home.  General instructions  Rest the affected area.  Do not take baths, swim, or use a hot tub until your health care provider approves. Ask your health care provider if you may take showers. You may only be allowed to take sponge baths.  Return to your normal activities as told by your health care provider. Ask your health care provider what activities are safe for you. Your health care provider may put you on activity or lifting restrictions.  The incision will continue to drain. It is normal to have some clear or slightly bloody drainage. The amount of drainage should lessen each day.  Do not apply any creams, ointments, or liquids unless you have been told to by your health care provider.  Keep all follow-up visits as told by your health care provider. This is important. Contact a health care provider if:  Your cyst or abscess returns.  You have a fever or chills.  You have more redness, swelling, or pain around your incision.  You have more fluid or blood coming from your incision.  Your incision feels warm to the touch.  You have pus or a bad smell coming from your incision.  You have red streaks   above or below the incision site. Get help right away if:  You have severe pain or bleeding.  You cannot eat or drink without vomiting.  You have decreased urine output.  You become short of breath.  You have chest pain.  You cough up blood.  The affected area becomes numb or starts to tingle. These symptoms may represent a serious problem that is an emergency. Do not wait to see if the symptoms will go away. Get medical help right away. Call your local emergency services (911 in the [place] Do not drive yourself to the  hospital. Summary  After this procedure, it is common to have fluid, blood, or pus coming from the surgery site.  Follow all home care instructions. You will be told how to take care of your incision, how to check for infection, and how to take medicines.  If you were prescribed an antibiotic medicine, take it as told by your health care provider. Do not stop taking the antibiotic even if you start to feel better.  Contact a health care provider if you have increased redness, swelling, or pain around your incision. Get help right away if you have chest pain, you vomit, you cough up blood, or you have shortness of breath.  Keep all follow-up visits as told by your health care provider. This is important. This information is not intended to replace advice given to you by your health care provider. Make sure you discuss any questions you have with your health care provider. Document Revised: 04/02/2018 Document Reviewed: 04/02/2018 Elsevier Patient Education  2020 [employer]vier Inc[employer]   

## 2019-12-19 ENCOUNTER — Encounter: Payer: Self-pay | Admitting: Gastroenterology

## 2019-12-20 ENCOUNTER — Encounter: Payer: Self-pay | Admitting: Family Medicine

## 2019-12-20 MED ORDER — LISDEXAMFETAMINE DIMESYLATE 30 MG PO CAPS: 30.0000 mg | ORAL_CAPSULE | Freq: Every day | ORAL | 0 refills | Status: DC

## 2019-12-23 ENCOUNTER — Ambulatory Visit
Admission: RE | Admit: 2019-12-23 | Discharge: 2019-12-23 | Disposition: A | Discharge status: Home / Self Care | Payer: BC Managed Care – PPO | Source: Ambulatory Visit | Attending: Family Medicine | Admitting: Family Medicine

## 2019-12-23 ENCOUNTER — Other Ambulatory Visit: Payer: Self-pay

## 2019-12-23 NOTE — Telephone Encounter (Signed)
LVM for patient to call back and schedule a 30 day f/u with [facility]

## 2019-12-24 ENCOUNTER — Other Ambulatory Visit: Payer: Self-pay

## 2019-12-24 ENCOUNTER — Other Ambulatory Visit: Payer: Self-pay | Admitting: Family Medicine

## 2019-12-24 DIAGNOSIS — R928 Other abnormal and inconclusive findings on diagnostic imaging of breast: Secondary | ICD-10-CM

## 2019-12-24 MED ORDER — LISDEXAMFETAMINE DIMESYLATE 30 MG PO CAPS: 30.0000 mg | ORAL_CAPSULE | Freq: Every day | ORAL | 0 refills | Status: DC

## 2019-12-24 NOTE — Progress Notes (Signed)
I sent this in

## 2019-12-24 NOTE — Telephone Encounter (Signed)
[facility]   [facility] no [facility] uses [facility].    She is requesting this pharmacy to be removed and for script to be sent to [facility] at [address]

## 2019-12-31 NOTE — Telephone Encounter (Signed)
Pt called to check up on authorization

## 2020-01-03 ENCOUNTER — Ambulatory Visit: Payer: BC Managed Care – PPO

## 2020-01-03 ENCOUNTER — Other Ambulatory Visit: Payer: Self-pay

## 2020-01-03 ENCOUNTER — Ambulatory Visit
Admission: RE | Admit: 2020-01-03 | Discharge: 2020-01-03 | Disposition: A | Discharge status: Home / Self Care | Payer: BC Managed Care – PPO | Source: Ambulatory Visit | Attending: Family Medicine | Admitting: Family Medicine

## 2020-01-03 DIAGNOSIS — R928 Other abnormal and inconclusive findings on diagnostic imaging of breast: Secondary | ICD-10-CM

## 2020-01-09 ENCOUNTER — Other Ambulatory Visit: Payer: BC Managed Care – PPO

## 2020-01-30 ENCOUNTER — Encounter: Payer: Self-pay | Admitting: Family Medicine

## 2020-01-30 ENCOUNTER — Ambulatory Visit: Payer: BC Managed Care – PPO | Admitting: Family Medicine

## 2020-01-30 ENCOUNTER — Other Ambulatory Visit: Payer: Self-pay

## 2020-01-30 VITALS — BP 122/68 | HR 80 | Temp 99.4°F | Resp 18 | Ht 61.0 in | Wt 152.4 lb

## 2020-01-30 DIAGNOSIS — F988 Other specified behavioral and emotional disorders with onset usually occurring in childhood and adolescence: Secondary | ICD-10-CM | POA: Diagnosis not present

## 2020-01-30 DIAGNOSIS — D649 Anemia, unspecified: Secondary | ICD-10-CM | POA: Diagnosis not present

## 2020-01-30 DIAGNOSIS — Z79899 Other long term (current) drug therapy: Secondary | ICD-10-CM | POA: Diagnosis not present

## 2020-01-30 MED ORDER — LISDEXAMFETAMINE DIMESYLATE 30 MG PO CAPS: 30.0000 mg | ORAL_CAPSULE | Freq: Every day | ORAL | 0 refills | Status: DC

## 2020-01-30 NOTE — Patient Instructions (Addendum)
Health Maintenance Due  Topic Date Due  . INFLUENZA VACCINE Declined in office flu shot today- please let us know when you get this at a later date so we can upload in epic 12/15/2019   I apologize but does not look like the records got scanned in for Adult ADD evaluation- please bring these next visit and we will try again. Or upload on mychart if able  Thanks for scheduling colonoscopy!   Call us 2 weeks ahead of running out of vyvanse (or mychart [place]) and we can do another 3 month rx- at 6 months want to se eyou in person if you want to push the December 9th visit out to 6 months before you leave  Could consider ferrous sulfate 325 mg weekly

## 2020-01-30 NOTE — Progress Notes (Signed)
Phone [phone] In person visit   Subjective:   Amber Elliott is a 44 y.o. year old very pleasant female patient who presents for/with See problem oriented charting Chief Complaint  Patient presents with  . Vyvanse follow up   This visit occurred during the SARS-CoV-2 public health emergency.  Safety protocols were in place, including screening questions prior to the visit, additional usage of staff PPE, and extensive cleaning of exam room while observing appropriate contact time as indicated for disinfecting solutions.   Past Medical History-  Patient Active Problem List   Diagnosis Date Noted  . Attention deficit disorder (ADD) in adult 06/09/2014    Priority: Medium  . Asthma 02/02/2007    Priority: Medium  . Former smoker 10/06/2014    Priority: Low  . Allergic rhinitis 02/02/2007    Priority: Low    Medications- reviewed and updated Current Outpatient Medications  Medication Sig Dispense Refill  . albuterol (PROAIR HFA) 108 (90 Base) MCG/ACT inhaler 2 puffs every six hours as needed. 8.5 g 1  . Calcium Citrate Malate-Vit D (CALCIUM + D) 250-100 MG-UNIT TABS Take by mouth daily.      . Multiple Vitamin (MULTIVITAMIN) capsule Take 1 capsule by mouth daily.      Marland Kitchen lisdexamfetamine (VYVANSE) 30 MG capsule Take 1 capsule (30 mg total) by mouth daily before breakfast. 30 capsule 0  . [START ON 02/29/2020] lisdexamfetamine (VYVANSE) 30 MG capsule Take 1 capsule (30 mg total) by mouth daily before breakfast. 30 capsule 0  . [START ON 03/30/2020] lisdexamfetamine (VYVANSE) 30 MG capsule Take 1 capsule (30 mg total) by mouth daily before breakfast. 30 capsule 0   No current facility-administered medications for this visit.     Objective:  BP 122/68   Pulse 80   Temp 99.4 F (37.4 C) (Temporal)   Resp 18   Ht 5\' 1"  (1.549 m)   Wt 152 lb 6.4 oz (69.1 kg)   SpO2 98%   BMI 28.80 kg/m  Gen: NAD, resting comfortably CV: RRR no murmurs rubs or gallops Lungs: CTAB no  crackles, wheeze, rhonchi Ext: no edema Skin: warm, dry Neuro: grossly normal, moves all extremities     Assessment and Plan   # Adult ADD S: Medication: vyvanse 30mg  (20mg  not effetive) working well and helping with binge eating symptoms as well  Original diagnosis high school as well as college- she dropped this off but I dont see scanned in (she is going to send through [employer] or bring next visit- I apologized that I cannot find this) -UDS today ordered -controlled substance contract - to be signed today -PDMP reviewed -vyvanse only thorugh me  Since the last visit has the patient had any:  Appetite changes? Does help with binge eating- weight stable Unintentional weight loss? No Is medication working well ? Yes Does patient take drug holidays? No Difficulties falling to sleep or maintaining sleep? No Any anxiety?  No Any cardiac issues (fainting or paliptations)? No Suicidal thoughts? No Changes in health since last visit? No New medications? No Any illicit substance abuse? No Has the patient taken his medication today? Yes  A/P: Adult ADD well controlled on Vyvanse 30 mg.  Also helping with some binge eating tendencies.  Continue current medication-3 months of refills given with plan to check back in in 3 months via MyChart for another set of refills and its each other in person in 6 months  #Anemia-mild anemia noted on last labs.  Thankfully leukopenia had resolved.  Patient denies heavy periods.  She was donating blood regularly through December 2020 and potentially could have low iron with MCV on normal side-we are going to try iron once a week through next visit- We draw blood can consider checking ferritin/iron levels Offered to draw blood today and if you prefer to wait until next visit at least or CPE   Recommended follow up:  6 months in person Future Appointments  Date Time Provider [place]  02/24/2020  4:30 PM [facility] PREVISIT RM50 [facility]  [facility]  03/09/2020  8:30 AM Thornton Park, MD [facility]  04/23/2020  4:20 PM Marin Olp, MD [facility]    Lab/Order associations:   ICD-10-CM   1. Attention deficit disorder (ADD) in adult  F98.8   2. Anemia, unspecified type  D64.9   3. High risk medication use  Z79.899 DRUG MONITORING, PANEL 8 WITH CONFIRMATION, URINE    Meds ordered this encounter  Medications  . lisdexamfetamine (VYVANSE) 30 MG capsule    Sig: Take 1 capsule (30 mg total) by mouth daily before breakfast.    Dispense:  30 capsule    Refill:  0  . lisdexamfetamine (VYVANSE) 30 MG capsule    Sig: Take 1 capsule (30 mg total) by mouth daily before breakfast.    Dispense:  30 capsule    Refill:  0  . lisdexamfetamine (VYVANSE) 30 MG capsule    Sig: Take 1 capsule (30 mg total) by mouth daily before breakfast.    Dispense:  30 capsule    Refill:  0   Return precautions advised.  Garret Reddish, MD

## 2020-02-01 LAB — DRUG MONITORING, PANEL 8 WITH CONFIRMATION, URINE
6 Acetylmorphine: NEGATIVE ng/mL (ref <10)
Alcohol Metabolites: POSITIVE ng/mL — AB
Amphetamine: 5000 ng/mL — ABNORMAL HIGH (ref <250)
Amphetamines: POSITIVE ng/mL — AB (ref <500)
Benzodiazepines: NEGATIVE ng/mL (ref <100)
Buprenorphine, Urine: NEGATIVE ng/mL (ref <5)
Cocaine Metabolite: NEGATIVE ng/mL (ref <150)
Creatinine: 96.2 mg/dL
Ethyl Glucuronide (ETG): 2266 ng/mL — ABNORMAL HIGH (ref <500)
Ethyl Sulfate (ETS): 670 ng/mL — ABNORMAL HIGH (ref <100)
MDMA: NEGATIVE ng/mL (ref <500)
Marijuana Metabolite: NEGATIVE ng/mL (ref <20)
Methamphetamine: NEGATIVE ng/mL (ref <250)
Opiates: NEGATIVE ng/mL (ref <100)
Oxidant: NEGATIVE ug/mL
Oxycodone: NEGATIVE ng/mL (ref <100)
pH: 5.2 (ref 4.5–9.0)

## 2020-02-01 LAB — DM TEMPLATE

## 2020-02-24 ENCOUNTER — Other Ambulatory Visit: Payer: Self-pay

## 2020-02-24 ENCOUNTER — Encounter: Payer: Self-pay | Admitting: Gastroenterology

## 2020-02-24 ENCOUNTER — Ambulatory Visit (AMBULATORY_SURGERY_CENTER): Payer: Self-pay | Admitting: *Deleted

## 2020-02-24 VITALS — Ht 61.0 in | Wt 151.0 lb

## 2020-02-24 DIAGNOSIS — Z1211 Encounter for screening for malignant neoplasm of colon: Secondary | ICD-10-CM

## 2020-02-24 MED ORDER — SUTAB 1479-225-188 MG PO TABS: 24.0000 | ORAL_TABLET | ORAL | 0 refills | Status: DC

## 2020-02-24 NOTE — Progress Notes (Signed)
No egg or soy allergy known to patient  No issues with past sedation with any surgeries or procedures no intubation problems in the past  No FH of Malignant Hyperthermia No diet pills per patient No home 02 use per patient  No blood thinners per patient  Pt denies issues with constipation  No A fib or A flutter  EMMI video to pt or via [place] 19 guidelines implemented in PV today with Pt and RN   cov vax x 3  [place]  Coupon given to pt in PV today , Code to Pharmacy   Due to the COVID-19 pandemic we are asking patients to follow these guidelines. Please only bring one care partner. Please be aware that your care partner may wait in the car in the parking lot or if they feel like they will be too hot to wait in the car, they may wait in the lobby on the 4th floor. All care partners are required to wear a mask the entire time (we do not have any that we can provide them), they need to practice social distancing, and we will do a Covid check for all patient's and care partners when you arrive. Also we will check their temperature and your temperature. If the care partner waits in their car they need to stay in the parking lot the entire time and we will call them on their cell phone when the patient is ready for discharge so they can bring the car to the front of the building. Also all patient's will need to wear a mask into building.

## 2020-03-02 ENCOUNTER — Other Ambulatory Visit: Payer: Self-pay | Admitting: Family Medicine

## 2020-03-02 NOTE — Telephone Encounter (Signed)
Last refill: Last OV: 01/30/20 dx. ADD

## 2020-03-04 ENCOUNTER — Other Ambulatory Visit: Payer: Self-pay | Admitting: Family Medicine

## 2020-03-05 NOTE — Telephone Encounter (Signed)
Please explain-I gave patient enough refills to get her through [facility] does she need an early refill?  Please check with pharmacy to see if they can release the other 2 orders or if there are issues with those

## 2020-03-06 NOTE — Telephone Encounter (Signed)
Patient is calling in asking for an update, states she only has 2 pills left.

## 2020-03-09 ENCOUNTER — Other Ambulatory Visit: Payer: Self-pay

## 2020-03-09 ENCOUNTER — Ambulatory Visit (AMBULATORY_SURGERY_CENTER): Payer: BC Managed Care – PPO | Admitting: Gastroenterology

## 2020-03-09 ENCOUNTER — Encounter: Payer: Self-pay | Admitting: Gastroenterology

## 2020-03-09 VITALS — BP 112/72 | HR 67 | Temp 98.2°F | Resp 10 | Ht 61.0 in | Wt 151.0 lb

## 2020-03-09 DIAGNOSIS — Z1211 Encounter for screening for malignant neoplasm of colon: Secondary | ICD-10-CM | POA: Diagnosis present

## 2020-03-09 DIAGNOSIS — D125 Benign neoplasm of sigmoid colon: Secondary | ICD-10-CM

## 2020-03-09 DIAGNOSIS — D123 Benign neoplasm of transverse colon: Secondary | ICD-10-CM

## 2020-03-09 DIAGNOSIS — K635 Polyp of colon: Secondary | ICD-10-CM | POA: Diagnosis not present

## 2020-03-09 DIAGNOSIS — D124 Benign neoplasm of descending colon: Secondary | ICD-10-CM

## 2020-03-09 MED ORDER — SODIUM CHLORIDE 0.9 % IV SOLN: 500.0000 mL | Freq: Once | INTRAVENOUS | Status: DC

## 2020-03-09 NOTE — Progress Notes (Signed)
Called to room to assist during endoscopic procedure.  Patient ID and intended procedure confirmed with present staff. Received instructions for my participation in the procedure from the performing physician.  

## 2020-03-09 NOTE — Telephone Encounter (Signed)
Patient aware that pharmacy had script ready for pick up

## 2020-03-09 NOTE — Op Note (Signed)
[facility] Patient Name: Amber Elliott Procedure Date: 03/09/2020 8:04 AM MRN: [id number] Endoscopist: Thornton Park MD, MD Age: 44 Referring MD:  Date of Birth: 1975/10/09 Gender: Female Account #: [id number] Procedure:                Colonoscopy Indications:              Screening for colorectal malignant neoplasm, This                            is the patient's first colonoscopy                           Maternal grandmother with colon polyps. No other                            known family history of colon cancer or polyps. Medicines:                Monitored Anesthesia Care Procedure:                Pre-Anesthesia Assessment:                           - Prior to the procedure, a History and Physical                            was performed, and patient medications and                            allergies were reviewed. The patient's tolerance of                            previous anesthesia was also reviewed. The risks                            and benefits of the procedure and the sedation                            options and risks were discussed with the patient.                            All questions were answered, and informed consent                            was obtained. Prior Anticoagulants: The patient has                            taken no previous anticoagulant or antiplatelet                            agents. ASA Grade Assessment: II - A patient with                            mild systemic disease. After reviewing the risks  and benefits, the patient was deemed in                            satisfactory condition to undergo the procedure.                           After obtaining informed consent, the colonoscope                            was passed under direct vision. Throughout the                            procedure, the patient's blood pressure, pulse, and                            oxygen saturations were  monitored continuously. The                            Colonoscope was introduced through the anus and                            advanced to the 3 cm into the ileum. A second                            forward view of the right colon was performed. The                            colonoscopy was performed without difficulty. The                            patient tolerated the procedure well. The quality                            of the bowel preparation was good. The terminal                            ileum, ileocecal valve, appendiceal orifice, and                            rectum were photographed. Scope In: 8:20:25 AM Scope Out: 8:37:27 AM Scope Withdrawal Time: 0 hours 14 minutes 13 seconds  Total Procedure Duration: 0 hours 17 minutes 2 seconds  Findings:                 The perianal and digital rectal examinations were                            normal.                           A 1-2 mm polyp was found in the distal sigmoid                            colon. The polyp was sessile. The polyp was removed  with a cold snare. Resection and retrieval were                            complete. Estimated blood loss was minimal.                           A 2 mm polyp was found in the descending colon. The                            polyp was sessile. The polyp was removed with a                            cold snare. Resection and retrieval were complete.                            Estimated blood loss was minimal.                           A 3 mm polyp was found in the hepatic flexure. The                            polyp was flat. The polyp was removed with a cold                            snare. Resection and retrieval were complete.                            Estimated blood loss was minimal.                           The exam was otherwise without abnormality on                            direct and retroflexion views. Complications:            No  immediate complications. Estimated blood loss:                            Minimal. Estimated Blood Loss:     Estimated blood loss was minimal. Impression:               - One 1-2 mm polyp in the distal sigmoid colon,                            removed with a cold snare. Resected and retrieved.                           - One 2 mm polyp in the descending colon, removed                            with a cold snare. Resected and retrieved.                           - One  3 mm polyp at the hepatic flexure, removed                            with a cold snare. Resected and retrieved.                           - The examination was otherwise normal on direct                            and retroflexion views. Recommendation:           - Patient has a contact number available for                            emergencies. The signs and symptoms of potential                            delayed complications were discussed with the                            patient. Return to normal activities tomorrow.                            Written discharge instructions were provided to the                            patient.                           - Resume previous diet.                           - Continue present medications.                           - Await pathology results.                           - Repeat colonoscopy date to be determined after                            pending pathology results are reviewed for                            surveillance.                           - Emerging evidence supports eating a diet of                            fruits, vegetables, grains, calcium, and yogurt                            while reducing red meat and alcohol may reduce the  risk of colon cancer.                           - Thank you for allowing me to be involved in your                            colon cancer prevention. Thornton Park MD, MD 03/09/2020 8:46:49  AM This report has been signed electronically.

## 2020-03-09 NOTE — Progress Notes (Signed)
VS by CW  No changes to medical or socal hx since previsit.

## 2020-03-09 NOTE — Progress Notes (Signed)
Report given to PACU, vss 

## 2020-03-09 NOTE — Patient Instructions (Signed)
Information on polyps given to you today.  Await pathology results.  Resume previous diet and medications.  YOU HAD AN ENDOSCOPIC PROCEDURE TODAY AT THE [facility]:   Refer to the procedure report that was given to you for any specific questions about what was found during the examination.  If the procedure report does not answer your questions, please call your gastroenterologist to clarify.  If you requested that your care partner not be given the details of your procedure findings, then the procedure report has been included in a sealed envelope for you to review at your convenience later.  YOU SHOULD EXPECT: Some feelings of bloating in the abdomen. Passage of more gas than usual.  Walking can help get rid of the air that was put into your GI tract during the procedure and reduce the bloating. If you had a lower endoscopy (such as a colonoscopy or flexible sigmoidoscopy) you may notice spotting of blood in your stool or on the toilet paper. If you underwent a bowel prep for your procedure, you may not have a normal bowel movement for a few days.  Please Note:  You might notice some irritation and congestion in your nose or some drainage.  This is from the oxygen used during your procedure.  There is no need for concern and it should clear up in a day or so.  SYMPTOMS TO REPORT IMMEDIATELY:   Following lower endoscopy (colonoscopy or flexible sigmoidoscopy):  Excessive amounts of blood in the stool  Significant tenderness or worsening of abdominal pains  Swelling of the abdomen that is new, acute  Fever of 100F or higher   For urgent or emergent issues, a gastroenterologist can be reached at any hour by calling [phone]. Do not use MyChart messaging for urgent concerns.    DIET:  We do recommend a small meal at first, but then you may proceed to your regular diet.  Drink plenty of fluids but you should avoid alcoholic beverages for 24 hours.  ACTIVITY:  You should  plan to take it easy for the rest of today and you should NOT DRIVE or use heavy machinery until tomorrow (because of the sedation medicines used during the test).    FOLLOW UP: Our staff will call the number listed on your records 48-72 hours following your procedure to check on you and address any questions or concerns that you may have regarding the information given to you following your procedure. If we do not reach you, we will leave a message.  We will attempt to reach you two times.  During this call, we will ask if you have developed any symptoms of COVID 19. If you develop any symptoms (ie: fever, flu-like symptoms, shortness of breath, cough etc.) before then, please call [phone].  If you test positive for Covid 19 in the 2 weeks post procedure, please call and report this information to us.    If any biopsies were taken you will be contacted by phone or by letter within the next 1-3 weeks.  Please call us at [phone] if you have not heard about the biopsies in 3 weeks.    SIGNATURES/CONFIDENTIALITY: You and/or your care partner have signed paperwork which will be entered into your electronic medical record.  These signatures attest to the fact that that the information above on your After Visit Summary has been reviewed and is understood.  Full responsibility of the confidentiality of this discharge information lies with you and/or your care-partner. 

## 2020-03-11 ENCOUNTER — Telehealth: Payer: Self-pay | Admitting: *Deleted

## 2020-03-11 ENCOUNTER — Telehealth: Payer: Self-pay

## 2020-03-11 NOTE — Telephone Encounter (Signed)
Second f/u call attempt.  LVM 

## 2020-03-11 NOTE — Telephone Encounter (Signed)
Left message on follow up call. 

## 2020-03-12 ENCOUNTER — Encounter: Payer: Self-pay | Admitting: Gastroenterology

## 2020-04-10 ENCOUNTER — Encounter: Payer: Self-pay | Admitting: Family Medicine

## 2020-04-23 ENCOUNTER — Ambulatory Visit: Payer: BC Managed Care – PPO | Admitting: Family Medicine

## 2020-06-23 ENCOUNTER — Other Ambulatory Visit: Payer: Self-pay | Admitting: Family Medicine

## 2020-06-27 MED ORDER — LISDEXAMFETAMINE DIMESYLATE 30 MG PO CAPS: 30.0000 mg | ORAL_CAPSULE | Freq: Every day | ORAL | 0 refills | Status: DC

## 2020-07-29 NOTE — Progress Notes (Deleted)
  Phone [phone] In person visit   Subjective:   Amber Elliott is a 45 y.o. year old very pleasant female patient who presents for/with See problem oriented charting No chief complaint on file.   This visit occurred during the SARS-CoV-2 public health emergency.  Safety protocols were in place, including screening questions prior to the visit, additional usage of staff PPE, and extensive cleaning of exam room while observing appropriate contact time as indicated for disinfecting solutions.   Past Medical History-  Patient Active Problem List   Diagnosis Date Noted  . Former smoker 10/06/2014  . Attention deficit disorder (ADD) in adult 06/09/2014  . Allergic rhinitis 02/02/2007  . Asthma 02/02/2007    Medications- reviewed and updated Current Outpatient Medications  Medication Sig Dispense Refill  . albuterol (PROAIR HFA) 108 (90 Base) MCG/ACT inhaler 2 puffs every six hours as needed. 8.5 g 1  . Calcium Citrate Malate-Vit D (CALCIUM + D) 250-100 MG-UNIT TABS Take by mouth daily.   (Patient not taking: Reported on 03/09/2020)    . lisdexamfetamine (VYVANSE) 30 MG capsule Take 1 capsule (30 mg total) by mouth daily before breakfast. 30 capsule 0  . lisdexamfetamine (VYVANSE) 30 MG capsule Take 1 capsule (30 mg total) by mouth daily before breakfast. 30 capsule 0  . [START ON 08/26/2020] lisdexamfetamine (VYVANSE) 30 MG capsule Take 1 capsule (30 mg total) by mouth daily before breakfast. 30 capsule 0  . Multiple Vitamin (MULTIVITAMIN) capsule Take 1 capsule by mouth daily.       No current facility-administered medications for this visit.     Objective:  There were no vitals taken for this visit. Gen: NAD, resting comfortably CV: RRR no murmurs rubs or gallops Lungs: CTAB no crackles, wheeze, rhonchi Abdomen: soft/nontender/nondistended/normal bowel sounds. No rebound or guarding.  Ext: no edema Skin: warm, dry Neuro: grossly normal, moves all extremities  ***     Assessment and Plan  *** 10/21/19 cpe ***  No problem-specific Assessment & Plan notes found for this encounter.   Recommended follow up: ***No follow-ups on file. Future Appointments  Date Time Provider [place]  08/03/2020  4:00 PM Marin Olp, MD [facility]    Lab/Order associations: No diagnosis found.  No orders of the defined types were placed in this encounter.   Time Spent: *** minutes of total time (7:54 PM***- 7:54 PM***) was spent on the date of the encounter performing the following actions: chart review prior to seeing the patient, obtaining history, performing a medically necessary exam, counseling on the treatment plan, placing orders, and documenting in our EHR.   Return precautions advised.  Clyde Lundborg, CMA

## 2020-07-29 NOTE — Patient Instructions (Incomplete)
Health Maintenance Due  Topic Date Due  . INFLUENZA VACCINE  12/15/2019  . COVID-19 Vaccine (3 - Booster for Pfizer series) 01/09/2020  . TETANUS/TDAP  06/04/2020   Depression screen PHQ 2/9 10/21/2019  Decreased Interest 0  Down, Depressed, Hopeless 0  PHQ - 2 Score 0

## 2020-08-03 ENCOUNTER — Ambulatory Visit: Payer: BC Managed Care – PPO | Admitting: Family Medicine

## 2020-08-03 DIAGNOSIS — F988 Other specified behavioral and emotional disorders with onset usually occurring in childhood and adolescence: Secondary | ICD-10-CM

## 2020-08-11 ENCOUNTER — Encounter: Payer: Self-pay | Admitting: Nurse Practitioner

## 2020-08-11 ENCOUNTER — Other Ambulatory Visit: Payer: Self-pay

## 2020-08-11 ENCOUNTER — Ambulatory Visit: Payer: BC Managed Care – PPO | Admitting: Nurse Practitioner

## 2020-08-11 VITALS — BP 120/74

## 2020-08-11 DIAGNOSIS — N939 Abnormal uterine and vaginal bleeding, unspecified: Secondary | ICD-10-CM

## 2020-08-11 DIAGNOSIS — N946 Dysmenorrhea, unspecified: Secondary | ICD-10-CM

## 2020-08-11 NOTE — Progress Notes (Signed)
   Acute Office Visit  Subjective:    Patient ID: Amber Elliott, female    DOB: [birth year], 45 y.o.   MRN: [id number]   HPI 45 y.o. presents today for heavy menstrual bleeding with severe dysmenorrhea. She continues to have regular monthly cycles with January and February menses being lighter than her normal. LMP 08/07/2020 with heavy bleeding with clots for a couple of days that stopped suddenly, had severe abdominal cramping with vomiting, then heavy bleeding started back again for a few hours. Some relief with Ibuprofen 800 mg. Today she is not bleeding and pain has subsided. Denies menopausal symptoms. Sexually active, uses condoms consistently.   Review of Systems  Constitutional: Negative.   Gastrointestinal: Positive for abdominal pain and vomiting. Negative for abdominal distention, constipation and diarrhea.  Genitourinary: Positive for menstrual problem.       Objective:    Physical Exam Constitutional:      Appearance: Normal appearance.  Abdominal:     Tenderness: There is no abdominal tenderness. There is no guarding or rebound.  Genitourinary:    General: Normal vulva.     Vagina: Normal.     Cervix: Normal.     Uterus: Normal.      BP 120/74   LMP 08/07/2020  Wt Readings from Last 3 Encounters:  03/09/20 151 lb (68.5 kg)  02/24/20 151 lb (68.5 kg)  01/30/20 152 lb 6.4 oz (69.1 kg)        Assessment & Plan:   Problem List Items Addressed This Visit   None   Visit Diagnoses    Abnormal uterine bleeding (AUB)    -  Primary   Relevant Orders   US PELVIS TRANSVAGINAL NON-OB (TV ONLY)   Dysmenorrhea       Relevant Orders   US PELVIS TRANSVAGINAL NON-OB (TV ONLY)     Plan: We discussed that it is normal for cycles to begin to change in the 40s and that she may be entering into perimenopause based on changes in cycles the last few months. We will schedule ultrasound to rule out any uterine abnormalities. If ultrasound is normal we discussed management  of cycles with hormonal contraception if they continue to be bothersome. Ibuprofen 600-800 mg as needed for pain. She is agreeable to plan.     Tamela Gammon DNP, 2:51 PM 08/11/2020

## 2020-08-19 NOTE — Progress Notes (Signed)
Phone [phone] Virtual visit via Video note   Subjective:  Chief complaint: Chief Complaint  Patient presents with  . Anemia  . ADD  . Medication Screening    High risk medication usage.    This visit type was conducted due to national recommendations for restrictions regarding the COVID-19 Pandemic (e.g. social distancing).  This format is felt to be most appropriate for this patient at this time balancing risks to patient and risks to population by having him in for in person visit.  No physical exam was performed (except for noted visual exam or audio findings with Telehealth visits).    Our team/I connected with Amber Elliott at  3:40 PM EDT by a video enabled telemedicine application (doxy.me or caregility through epic) and verified that I am speaking with the correct person using two identifiers.  Location patient: [place] Location provider: [facility], office Persons participating in the virtual visit:  patient  Our team/I discussed the limitations of evaluation and management by telemedicine and the availability of in person appointments. In light of current covid-19 pandemic, patient also understands that we are trying to protect them by minimizing in office contact if at all possible.  The patient expressed consent for telemedicine visit and agreed to proceed. Patient understands insurance will be billed.   Past Medical History-  Patient Active Problem List   Diagnosis Date Noted  . Attention deficit disorder (ADD) in adult 06/09/2014    Priority: Medium  . Asthma 02/02/2007    Priority: Medium  . Former smoker 10/06/2014    Priority: Low  . Allergic rhinitis 02/02/2007    Priority: Low    Medications- reviewed and updated Current Outpatient Medications  Medication Sig Dispense Refill  . albuterol (PROAIR HFA) 108 (90 Base) MCG/ACT inhaler 2 puffs every six hours as needed. 8.5 g 1  . Calcium Citrate Malate-Vit D (CALCIUM + D) 250-100 MG-UNIT TABS Take by  mouth daily.    . Multiple Vitamin (MULTIVITAMIN) capsule Take 1 capsule by mouth daily.    Derrill Memo ON 10/19/2020] lisdexamfetamine (VYVANSE) 30 MG capsule Take 1 capsule (30 mg total) by mouth daily before breakfast. 30 capsule 0  . [START ON 09/19/2020] lisdexamfetamine (VYVANSE) 30 MG capsule Take 1 capsule (30 mg total) by mouth daily before breakfast. 30 capsule 0  . lisdexamfetamine (VYVANSE) 30 MG capsule Take 1 capsule (30 mg total) by mouth daily before breakfast. 30 capsule 0   No current facility-administered medications for this visit.     Objective:  Temp 97.6 F (36.4 C) (Oral)   LMP 08/07/2020  self reported vitals Gen: NAD, resting comfortably Lungs: nonlabored, normal respiratory rate  Skin: appears dry, no obvious rash    Assessment and Plan   # ADD S:control: vyvanse 30 mg remains effective- helps both ADD and binge eating medication:   Controlled substance contract: patient completed 01/30/20 but I do not see scanned into chart- team will review NCCSRS/PDMP reviewed:  Low risk- only rx through me Original diagnosis: she brought this by but for some reason did not get scanned in- she will try to upload to mychart- will remind at next visit if needed  Since the last visit has the patient had any:  Appetite changes? Yes  For apptite control Unintentional weight loss?No Is medication working well ? Yes Does patient take drug holidays? No  Not usually Difficulties falling to sleep or maintaining sleep? No Any anxiety?  No Any cardiac issues (fainting or paliptations)?No Suicidal thoughts? No  Changes in health since last visit? No New medications? No Any illicit substance abuse? No Has the patient taken his medication today? Yes A/P: well controlled- continue current meds  #Mild anemia - mild in past but donates blood regularly- we will recheck at CPE in 6 months Lab Results  Component Value Date   WBC 6.1 11/29/2019   HGB 11.4 (L) 11/29/2019   HCT 36.0  11/29/2019   MCV 82.4 11/29/2019   PLT 309 11/29/2019   Recommended follow up:  6 month CPE recommended Future Appointments  Date Time Provider [place]  09/03/2020  3:00 PM [facility] [place] [facility] None  09/03/2020  3:45 PM Salvadore Dom, MD [facility] None    Lab/Order associations:   ICD-10-CM   1. Attention deficit disorder (ADD) in adult  F98.8     Meds ordered this encounter  Medications  . lisdexamfetamine (VYVANSE) 30 MG capsule    Sig: Take 1 capsule (30 mg total) by mouth daily before breakfast.    Dispense:  30 capsule    Refill:  0  . lisdexamfetamine (VYVANSE) 30 MG capsule    Sig: Take 1 capsule (30 mg total) by mouth daily before breakfast.    Dispense:  30 capsule    Refill:  0  . lisdexamfetamine (VYVANSE) 30 MG capsule    Sig: Take 1 capsule (30 mg total) by mouth daily before breakfast.    Dispense:  30 capsule    Refill:  0   Return precautions advised.  Garret Reddish, MD

## 2020-08-19 NOTE — Patient Instructions (Addendum)
Health Maintenance Due  Topic Date Due  . COVID-19 Vaccine (3 - Booster for [employer] series)- has already had 3- appears to be a misfire by health maintainence 01/09/2020  . TETANUS/TDAP - discussed due if gets cut/scrape 06/04/2020

## 2020-08-20 ENCOUNTER — Other Ambulatory Visit: Payer: Self-pay

## 2020-08-20 ENCOUNTER — Telehealth (INDEPENDENT_AMBULATORY_CARE_PROVIDER_SITE_OTHER): Payer: BC Managed Care – PPO | Admitting: Family Medicine

## 2020-08-20 ENCOUNTER — Encounter: Payer: Self-pay | Admitting: Family Medicine

## 2020-08-20 VITALS — Temp 97.6°F

## 2020-08-20 DIAGNOSIS — F988 Other specified behavioral and emotional disorders with onset usually occurring in childhood and adolescence: Secondary | ICD-10-CM | POA: Diagnosis not present

## 2020-08-20 MED ORDER — LISDEXAMFETAMINE DIMESYLATE 30 MG PO CAPS: 30.0000 mg | ORAL_CAPSULE | Freq: Every day | ORAL | 0 refills | Status: DC

## 2020-09-03 ENCOUNTER — Other Ambulatory Visit: Payer: BC Managed Care – PPO

## 2020-09-03 ENCOUNTER — Other Ambulatory Visit: Payer: BC Managed Care – PPO | Admitting: Obstetrics and Gynecology

## 2020-10-12 ENCOUNTER — Encounter: Payer: Self-pay | Admitting: Family Medicine

## 2020-10-20 ENCOUNTER — Other Ambulatory Visit: Payer: Self-pay | Admitting: Family Medicine

## 2020-10-21 MED ORDER — LISDEXAMFETAMINE DIMESYLATE 30 MG PO CAPS: 30.0000 mg | ORAL_CAPSULE | Freq: Every day | ORAL | 0 refills | Status: DC

## 2020-10-21 NOTE — Telephone Encounter (Signed)
Pt requesting refill on Vyvanse 30 mg. Last OV 08/20/2020

## 2020-10-27 ENCOUNTER — Other Ambulatory Visit: Payer: Self-pay

## 2020-10-27 ENCOUNTER — Encounter: Payer: Self-pay | Admitting: Family Medicine

## 2020-10-27 MED ORDER — ALBUTEROL SULFATE HFA 108 (90 BASE) MCG/ACT IN AERS: INHALATION_SPRAY | RESPIRATORY_TRACT | 1 refills | Status: DC

## 2020-12-04 ENCOUNTER — Encounter: Payer: Self-pay | Admitting: Family Medicine

## 2020-12-06 MED ORDER — LISDEXAMFETAMINE DIMESYLATE 20 MG PO CAPS: 20.0000 mg | ORAL_CAPSULE | Freq: Every day | ORAL | 0 refills | Status: DC

## 2021-02-01 ENCOUNTER — Encounter: Payer: Self-pay | Admitting: Family Medicine

## 2021-02-02 ENCOUNTER — Other Ambulatory Visit: Payer: Self-pay

## 2021-02-02 MED ORDER — LISDEXAMFETAMINE DIMESYLATE 20 MG PO CAPS: 20.0000 mg | ORAL_CAPSULE | Freq: Every day | ORAL | 0 refills | Status: DC

## 2021-02-02 MED ORDER — LISDEXAMFETAMINE DIMESYLATE 20 MG PO CAPS
20.0000 mg | ORAL_CAPSULE | Freq: Every day | ORAL | 0 refills | Status: DC
Start: 2021-02-02 — End: 2021-05-07

## 2021-02-02 NOTE — Progress Notes (Signed)
I refilled this 

## 2021-02-16 ENCOUNTER — Other Ambulatory Visit: Payer: Self-pay | Admitting: Family Medicine

## 2021-02-16 DIAGNOSIS — Z1231 Encounter for screening mammogram for malignant neoplasm of breast: Secondary | ICD-10-CM

## 2021-02-18 DIAGNOSIS — Z1231 Encounter for screening mammogram for malignant neoplasm of breast: Secondary | ICD-10-CM

## 2021-02-19 ENCOUNTER — Other Ambulatory Visit: Payer: Self-pay | Admitting: Family Medicine

## 2021-02-19 DIAGNOSIS — Z1231 Encounter for screening mammogram for malignant neoplasm of breast: Secondary | ICD-10-CM

## 2021-02-22 ENCOUNTER — Ambulatory Visit
Admission: RE | Admit: 2021-02-22 | Discharge: 2021-02-22 | Disposition: A | Discharge status: Home / Self Care | Payer: BC Managed Care – PPO | Source: Ambulatory Visit

## 2021-02-22 ENCOUNTER — Other Ambulatory Visit: Payer: Self-pay

## 2021-02-22 DIAGNOSIS — Z1231 Encounter for screening mammogram for malignant neoplasm of breast: Secondary | ICD-10-CM

## 2021-05-05 NOTE — Patient Instructions (Signed)
Health Maintenance Due  Topic Date Due   Pneumococcal Vaccine 55-46 Years old (2 - PCV) 02/05/2009   COVID-19 Vaccine (4 - Booster for Pfizer series) 04/03/2020   TETANUS/TDAP  06/04/2020   INFLUENZA VACCINE  12/14/2020    Recommended follow up: No follow-ups on file.

## 2021-05-05 NOTE — Progress Notes (Signed)
Phone [phone] Virtual visit via Video note   Subjective:  Chief complaint: Chief Complaint  Patient presents with   Medication Refill    Needs Vyvanse refilled-currently taking 20 mg but wants to go back to the 30 mg   This visit type was conducted due to national recommendations for restrictions regarding the COVID-19 Pandemic (e.g. social distancing).  This format is felt to be most appropriate for this patient at this time balancing risks to patient and risks to population by having him in for in person visit.  No physical exam was performed (except for noted visual exam or audio findings with Telehealth visits).    Our team/I connected with Levi Aland at  8:40 AM EST by a video enabled telemedicine application (doxy.me or caregility through epic) and verified that I am speaking with the correct person using two identifiers.  Location patient: [place] Location provider: [facility], office Persons participating in the virtual visit:  patient  Our team/I discussed the limitations of evaluation and management by telemedicine and the availability of in person appointments. In light of current covid-19 pandemic, patient also understands that we are trying to protect them by minimizing in office contact if at all possible.  The patient expressed consent for telemedicine visit and agreed to proceed. Patient understands insurance will be billed.   Past Medical History-  Patient Active Problem List   Diagnosis Date Noted   Attention deficit disorder (ADD) in adult 06/09/2014    Priority: Medium    Asthma 02/02/2007    Priority: Medium    Former smoker 10/06/2014    Priority: Low   Allergic rhinitis 02/02/2007    Priority: Low    Medications- reviewed and updated Current Outpatient Medications  Medication Sig Dispense Refill   albuterol (PROAIR HFA) 108 (90 Base) MCG/ACT inhaler 2 puffs every six hours as needed. 8.5 g 1   Calcium Citrate Malate-Vit D (CALCIUM + D)  250-100 MG-UNIT TABS Take by mouth daily.     lisdexamfetamine (VYVANSE) 30 MG capsule Take 1 capsule (30 mg total) by mouth daily before breakfast. 30 capsule 0   [START ON 06/06/2021] lisdexamfetamine (VYVANSE) 30 MG capsule Take 1 capsule (30 mg total) by mouth daily before breakfast. 30 capsule 0   [START ON 07/06/2021] lisdexamfetamine (VYVANSE) 30 MG capsule Take 1 capsule (30 mg total) by mouth daily before breakfast. 30 capsule 0   Multiple Vitamin (MULTIVITAMIN) capsule Take 1 capsule by mouth daily.     No current facility-administered medications for this visit.     Objective:  Temp 98.3 F (36.8 C)    Ht 5\' 1"  (1.549 m)    Wt 150 lb 3.2 oz (68.1 kg)    BMI 28.38 kg/m  self reported vitals Gen: NAD, resting comfortably Lungs: nonlabored, normal respiratory rate  Skin: appears dry, no obvious rash     Assessment and Plan   # ADD S:control: vyvanse 20 mg was effective when things were calmer at work but things have really picked up and has she would do better on the 30mg  dose again. Eels she would do better on the 30mg  dose again.  - may want to do 20mg  again in future in summer breaks  - has also noted worsening of binge eating.  Controlled substance contract: patient completed 01/30/20 but I do not see scanned into chart- unable to locate- will update next visit NCCSRS/PDMP reviewed:  Low risk- only rx through me - reviewed today Original diagnosis: she brought this  by but for some reason did not get scanned in- she will try to upload to mychart- will try to get this done today  Since the last visit has the patient had any: Appetite changes? Yes- higher on lower dose Unintentional weight loss?No Is medication working well ? No not fully adequate Does patient take drug holidays? No typically - uses on weekends as well Difficulties falling to sleep or maintaining sleep? No Any anxiety?  No Any cardiac issues (fainting or paliptations)?No Suicidal thoughts? No Changes in  health since last visit? No other than viral illness New medications? No Any illicit substance abuse? No A/P: mild poor control with lower dose- we will go back to 30 mg which seemed more effective.  She will follow up in April- provide refills until her physical   # cough/congestion S:fever 101 yesterday and feels better today. Flu test negative yesterday. Had runny nose and that is persisting- some congestion. Covid test negative at home.   A/P: Patient with likely viral illness-already tested negative for flu and COVID-advised repeat COVID test at least 48 hours after first test just to be on the safe side.  Glad she is improving at present  Recommended follow up: Keep April physical or sooner if needed Future Appointments  Date Time Provider [place]  09/02/2021  4:00 PM Marin Olp, MD [facility]   Lab/Order associations:   ICD-10-CM   1. Attention deficit disorder (ADD) in adult  F98.8     2. Fever, unspecified fever cause  R50.9     3. Rhinorrhea  J34.89       Meds ordered this encounter  Medications   lisdexamfetamine (VYVANSE) 30 MG capsule    Sig: Take 1 capsule (30 mg total) by mouth daily before breakfast.    Dispense:  30 capsule    Refill:  0   lisdexamfetamine (VYVANSE) 30 MG capsule    Sig: Take 1 capsule (30 mg total) by mouth daily before breakfast.    Dispense:  30 capsule    Refill:  0   lisdexamfetamine (VYVANSE) 30 MG capsule    Sig: Take 1 capsule (30 mg total) by mouth daily before breakfast.    Dispense:  30 capsule    Refill:  0   I,Jada Bradford,acting as a scribe for Garret Reddish, MD.,have documented all relevant documentation on the behalf of Garret Reddish, MD,as directed by  Garret Reddish, MD while in the presence of Garret Reddish, MD.  I, Garret Reddish, MD, have reviewed all documentation for this visit. The documentation on 05/07/21 for the exam, diagnosis, procedures, and orders are all accurate and complete.  Return  precautions advised.  Garret Reddish, MD

## 2021-05-07 ENCOUNTER — Encounter: Payer: Self-pay | Admitting: Family Medicine

## 2021-05-07 ENCOUNTER — Telehealth (INDEPENDENT_AMBULATORY_CARE_PROVIDER_SITE_OTHER): Payer: BC Managed Care – PPO | Admitting: Family Medicine

## 2021-05-07 VITALS — Temp 98.3°F | Ht 61.0 in | Wt 150.2 lb

## 2021-05-07 DIAGNOSIS — J3489 Other specified disorders of nose and nasal sinuses: Secondary | ICD-10-CM

## 2021-05-07 DIAGNOSIS — F988 Other specified behavioral and emotional disorders with onset usually occurring in childhood and adolescence: Secondary | ICD-10-CM | POA: Diagnosis not present

## 2021-05-07 DIAGNOSIS — R509 Fever, unspecified: Secondary | ICD-10-CM

## 2021-05-07 MED ORDER — LISDEXAMFETAMINE DIMESYLATE 30 MG PO CAPS
30.0000 mg | ORAL_CAPSULE | Freq: Every day | ORAL | 0 refills | Status: DC
Start: 2021-06-06 — End: 2021-09-02

## 2021-05-07 MED ORDER — LISDEXAMFETAMINE DIMESYLATE 30 MG PO CAPS: 30.0000 mg | ORAL_CAPSULE | Freq: Every day | ORAL | 0 refills | Status: DC

## 2021-06-14 ENCOUNTER — Encounter: Payer: Self-pay | Admitting: Family Medicine

## 2021-07-23 NOTE — Progress Notes (Incomplete)
Phone [phone]   Subjective:  Patient presents today for their annual physical. Chief complaint-noted.   See problem oriented charting- ROS- full  review of systems was completed and negative except for: ***  The following were reviewed and entered/updated in epic: Past Medical History:  Diagnosis Date   ALLERGIC RHINITIS 02/02/2007   Allergy    Anemia    slight    ASTHMA 02/02/2007   Asthma    ATTENTION DEFICIT DISORDER, ADULT 05/14/2007   GERD 05/14/2007   symptoms resolved when became vegetarian   PONV (postoperative nausea and vomiting)    Patient Active Problem List   Diagnosis Date Noted   Former smoker 10/06/2014   Attention deficit disorder (ADD) in adult 06/09/2014   Allergic rhinitis 02/02/2007   Asthma 02/02/2007   Past Surgical History:  Procedure Laterality Date   BREAST REDUCTION SURGERY     age 40- 3 lbs of tissue   EYE SURGERY     esotropia   LEEP     89-no abnormal paps since   REDUCTION MAMMAPLASTY Bilateral    TONSILLECTOMY     1996 or 1997    Family History  Problem Relation Age of Onset   Arthritis Mother    AAA (abdominal aortic aneurysm) Mother        s/p repair in 66s   Other Mother        atherosclerosis   Stomach cancer Father        24s. led to his death at 85.    Other Father        menetrier disease- massive gastric folds   Diabetes Half-Brother    Other Half-Brother        unknown cause of death   Healthy Half-Brother    Healthy Half-Brother    Drug abuse Half-Sister        long term issues    Heart failure Maternal Grandmother        97 diagnosed with this   Colon polyps Maternal Grandmother    Alcohol abuse Paternal Grandmother    Colon cancer Neg Hx    Esophageal cancer Neg Hx    Rectal cancer Neg Hx     Medications- reviewed and updated Current Outpatient Medications  Medication Sig Dispense Refill   albuterol (PROAIR HFA) 108 (90 Base) MCG/ACT inhaler 2 puffs every six hours as needed. 8.5 g 1   Calcium  Citrate Malate-Vit D (CALCIUM + D) 250-100 MG-UNIT TABS Take by mouth daily.     lisdexamfetamine (VYVANSE) 30 MG capsule Take 1 capsule (30 mg total) by mouth daily before breakfast. 30 capsule 0   lisdexamfetamine (VYVANSE) 30 MG capsule Take 1 capsule (30 mg total) by mouth daily before breakfast. 30 capsule 0   lisdexamfetamine (VYVANSE) 30 MG capsule Take 1 capsule (30 mg total) by mouth daily before breakfast. 30 capsule 0   Multiple Vitamin (MULTIVITAMIN) capsule Take 1 capsule by mouth daily.     No current facility-administered medications for this visit.    Allergies-reviewed and updated No Known Allergies  Social History   Social History Narrative   Single- lives alone but close to mom. No children.       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    Moved to [place] for a few years- but moved back dec 2020 to [place] to help mom with AAA surgery.       Hobbies: reading, walks  Objective  Objective:  There were no vitals taken for this visit. Gen: NAD, resting comfortably HEENT: Mucous membranes are moist. Oropharynx normal Neck: no thyromegaly CV: RRR no murmurs rubs or gallops Lungs: CTAB no crackles, wheeze, rhonchi Abdomen: soft/nontender/nondistended/normal bowel sounds. No rebound or guarding.  Ext: no edema Skin: warm, dry Neuro: grossly normal, moves all extremities, PERRLA***   Assessment and Plan   46 y.o. female presenting for annual physical.  Health Maintenance counseling: 1. Anticipatory guidance: Patient counseled regarding regular dental exams ***q6 months, eye exams ***,  avoiding smoking and second hand smoke*** , limiting alcohol to 1 beverage per day-rare social .  *** .  No illicit drugs - *** 2. Risk factor reduction:  Advised patient of need for regular exercise and diet rich and fruits and vegetables to reduce risk of heart attack and stroke.  Exercise-  3-4  days a week walking or yoga or lift weights. ***.  Diet-feels like with ADD poorly controlled has more impulsivity/tendency to binge and habits have not been as good as previous off  meds. ***.  Wt Readings from Last 3 Encounters:  05/07/21 150 lb 3.2 oz (68.1 kg)  03/09/20 151 lb (68.5 kg)  02/24/20 151 lb (68.5 kg)   3. Immunizations/screenings/ancillary studies DISCUSSED:  -COVID booster vaccination #4- *** -TDAP vaccination - *** -Flu vaccination - *** Immunization History  Administered Date(s) Administered   Influenza Split 02/17/2012   Influenza Whole 03/12/2007, 02/14/2008   Influenza,inj,Quad PF,6+ Mos 05/13/2013, 05/05/2014, 02/27/2015   PFIZER(Purple Top)SARS-COV-2 Vaccination 06/21/2019, 07/12/2019, 02/07/2020   Pneumococcal Polysaccharide-23 02/06/2008   Td 06/04/2010   Varicella 12/21/2012, 01/23/2013   Health Maintenance Due  Topic Date Due   COVID-19 Vaccine (4 - Booster for Pfizer series) 04/03/2020   TETANUS/TDAP  06/04/2020   INFLUENZA VACCINE  12/14/2020   4. Cervical cancer screening- pap smear 12/12/19 with 3 year repeat planned*** 5. Breast cancer screening-  breast exam with PCP *** and mammogram 02/22/21 with 1 year repeat planned*** 6. Colon cancer screening - 03/09/20 with 5 year repeat planned*** 7. Skin cancer screening- no dermatologist***advised regular sunscreen use. Denies worrisome, changing, or new skin lesions.  8. Birth control/STD check- not dating. Has never had unprotected sex and then not been tested. [facility] 1.5 years ago.  menstruation age 38 or 21. no menoapuse. regular periods. Not active at the moment*** 9. Osteoporosis screening at 52- will plan on this- former smoker slightly increases risk*** -former smoker- no regular screening required. ***  Status of chronic or acute concerns   # ADD S:control: vyvanse 20 mg was effective when things were calmer at work but things had really picked up and has she would do better on the '30mg'$   dose again. Felt she would do better on the '30mg'$  dose again.  - may want to do '20mg'$  again in future in summer breaks   - had also noted worsened of binge eating.  Controlled substance contract: patient completed 01/30/20 but I do not see scanned into chart- unable to locate- will update next visit NCCSRS/PDMP reviewed:  Low risk- only rx through me - reviewed today Original diagnosis: she brought this by but for some reason did not get scanned in- she will try to upload to mychart-  Since the last visit has the patient had any: Appetite changes? Yes- higher on lower dose Unintentional weight loss?No Is medication working well ? No not fully adequate Does patient take drug holidays? No typically - uses on weekends as well  Difficulties falling to sleep or maintaining sleep? No Any anxiety?  No Any cardiac issues (fainting or paliptations)?No Suicidal thoughts? No Changes in health since last visit? No other than viral illness New medications? No Any illicit substance abuse? No A/P: ***   # cough/congestion S:fever 101 the day before 12/22 visit and felt better the day of visit. Flu test negative.  Had runny nose and that was persisting- some congestion. Covid test negative at home.   A/P: ***    Recommended follow up: No follow-ups on file. Future Appointments  Date Time Provider [place]  09/02/2021  4:00 PM Marin Olp, MD [facility]    No chief complaint on file.  Lab/Order associations:*** fasting No diagnosis found.  No orders of the defined types were placed in this encounter.   Return precautions advised.  Burnett Corrente

## 2021-08-19 ENCOUNTER — Encounter: Payer: BC Managed Care – PPO | Admitting: Family Medicine

## 2021-09-02 ENCOUNTER — Encounter: Payer: Self-pay | Admitting: Family Medicine

## 2021-09-02 ENCOUNTER — Ambulatory Visit (INDEPENDENT_AMBULATORY_CARE_PROVIDER_SITE_OTHER): Payer: BC Managed Care – PPO | Admitting: Family Medicine

## 2021-09-02 VITALS — BP 112/72 | HR 80 | Temp 97.8°F | Ht 61.0 in | Wt 164.2 lb

## 2021-09-02 DIAGNOSIS — Z0000 Encounter for general adult medical examination without abnormal findings: Secondary | ICD-10-CM | POA: Diagnosis not present

## 2021-09-02 DIAGNOSIS — E785 Hyperlipidemia, unspecified: Secondary | ICD-10-CM | POA: Diagnosis not present

## 2021-09-02 DIAGNOSIS — Z23 Encounter for immunization: Secondary | ICD-10-CM | POA: Diagnosis not present

## 2021-09-02 DIAGNOSIS — F988 Other specified behavioral and emotional disorders with onset usually occurring in childhood and adolescence: Secondary | ICD-10-CM

## 2021-09-02 MED ORDER — HYDROXYZINE PAMOATE 25 MG PO CAPS: 25.0000 mg | ORAL_CAPSULE | Freq: Three times a day (TID) | ORAL | 2 refills | Status: DC | PRN

## 2021-09-02 NOTE — Patient Instructions (Addendum)
Follow up with dentist ? ?Trial hydroxyzine - can use in the day but ideally wouldn't drive- I think it may be particularly helpful at night. If you want to try just half tablet at first that's reasonable.  ? 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  ? ?Thanks for doing labs today ?If you have mychart- we will send your results within 3 business days of [place] receiving them.  ?If you do not have mychart- we will call you about results within 5 business days of [place] receiving them.  ?*please also note that you will see labs on mychart as soon as they post. I will later go in and write notes on them- will say "notes from Dr. Yong Channel"  ? ?Recommended follow up: 2-3 months ?

## 2021-09-03 ENCOUNTER — Encounter: Payer: Self-pay | Admitting: Family Medicine

## 2021-09-03 LAB — COMPREHENSIVE METABOLIC PANEL
ALT: 10 U/L (ref 0–35)
AST: 17 U/L (ref 0–37)
Albumin: 4.3 g/dL (ref 3.5–5.2)
Alkaline Phosphatase: 33 U/L — ABNORMAL LOW (ref 39–117)
BUN: 20 mg/dL (ref 6–23)
CO2: 21 mEq/L (ref 19–32)
Calcium: 9.1 mg/dL (ref 8.4–10.5)
Chloride: 103 mEq/L (ref 96–112)
Creatinine, Ser: 0.84 mg/dL (ref 0.40–1.20)
GFR: 82.87 mL/min (ref >60.00)
Glucose, Bld: 74 mg/dL (ref 70–99)
Potassium: 3.8 mEq/L (ref 3.5–5.1)
Sodium: 135 mEq/L (ref 135–145)
Total Bilirubin: 0.3 mg/dL (ref 0.2–1.2)
Total Protein: 7 g/dL (ref 6.0–8.3)

## 2021-09-03 LAB — CBC WITH DIFFERENTIAL/PLATELET
Basophils Absolute: 0.1 10*3/uL (ref 0.0–0.1)
Basophils Relative: 2.2 % (ref 0.0–3.0)
Eosinophils Absolute: 0.5 10*3/uL (ref 0.0–0.7)
Eosinophils Relative: 7.4 % — ABNORMAL HIGH (ref 0.0–5.0)
HCT: 28 % — ABNORMAL LOW (ref 36.0–46.0)
Hemoglobin: 8.7 g/dL — ABNORMAL LOW (ref 12.0–15.0)
Lymphocytes Relative: 45 % (ref 12.0–46.0)
Lymphs Abs: 3 10*3/uL (ref 0.7–4.0)
MCHC: 31.1 g/dL (ref 30.0–36.0)
MCV: 66 fl — ABNORMAL LOW (ref 78.0–100.0)
Monocytes Absolute: 0.6 10*3/uL (ref 0.1–1.0)
Monocytes Relative: 9.6 % (ref 3.0–12.0)
Neutro Abs: 2.4 10*3/uL (ref 1.4–7.7)
Neutrophils Relative %: 35.8 % — ABNORMAL LOW (ref 43.0–77.0)
Platelets: 336 10*3/uL (ref 150.0–400.0)
RBC: 4.25 Mil/uL (ref 3.87–5.11)
RDW: 18.3 % — ABNORMAL HIGH (ref 11.5–15.5)
WBC: 6.7 10*3/uL (ref 4.0–10.5)

## 2021-09-03 LAB — LIPID PANEL
Cholesterol: 171 mg/dL (ref 0–200)
HDL: 73.7 mg/dL (ref >39.00)
LDL Cholesterol: 85 mg/dL (ref 0–99)
NonHDL: 96.89
Total CHOL/HDL Ratio: 2
Triglycerides: 60 mg/dL (ref 0.0–149.0)
VLDL: 12 mg/dL (ref 0.0–40.0)

## 2021-09-03 LAB — TSH: TSH: 2.05 u[IU]/mL (ref 0.35–5.50)

## 2021-09-06 ENCOUNTER — Other Ambulatory Visit: Payer: Self-pay

## 2021-09-06 DIAGNOSIS — D649 Anemia, unspecified: Secondary | ICD-10-CM

## 2021-09-07 ENCOUNTER — Other Ambulatory Visit (INDEPENDENT_AMBULATORY_CARE_PROVIDER_SITE_OTHER): Payer: BC Managed Care – PPO

## 2021-09-07 DIAGNOSIS — D649 Anemia, unspecified: Secondary | ICD-10-CM | POA: Diagnosis not present

## 2021-09-07 LAB — FECAL OCCULT BLOOD, IMMUNOCHEMICAL: Fecal Occult Bld: NEGATIVE

## 2021-09-10 NOTE — Telephone Encounter (Signed)
See below

## 2021-09-16 ENCOUNTER — Encounter: Payer: Self-pay | Admitting: Family Medicine

## 2021-10-07 ENCOUNTER — Encounter: Payer: Self-pay | Admitting: Family Medicine

## 2021-10-07 ENCOUNTER — Ambulatory Visit: Payer: BC Managed Care – PPO | Admitting: Family Medicine

## 2021-10-07 VITALS — BP 120/66 | HR 91 | Temp 100.5°F | Ht 61.0 in | Wt 171.1 lb

## 2021-10-07 DIAGNOSIS — J40 Bronchitis, not specified as acute or chronic: Secondary | ICD-10-CM

## 2021-10-07 MED ORDER — BENZONATATE 100 MG PO CAPS: 100.0000 mg | ORAL_CAPSULE | Freq: Three times a day (TID) | ORAL | 0 refills | Status: DC | PRN

## 2021-10-07 MED ORDER — AZITHROMYCIN 250 MG PO TABS: ORAL_TABLET | ORAL | 0 refills | Status: AC

## 2021-10-07 NOTE — Patient Instructions (Signed)
Meds have been sent the the pharmacy You can take tylenol/motrin for pain/fevers If worsening symptoms, let us know or go to the Emergency room

## 2021-10-07 NOTE — Progress Notes (Signed)
Subjective:     Patient ID: Amber Elliott, female    DOB: 1975-12-06, 46 y.o.   MRN: [id number]  Chief Complaint  Patient presents with   Cough    Started last weekend Took covid and strep tests, all negative Cough causing chest discomfort Taking OTC medicine, Tylenol, Advil    Fever   Sore Throat   Generalized Body Aches   Hoarse    HPI Chief complaint: cough Symptom onset: 5 days but uri 10days ago as well Pertinent positives: sore throat, aches, hoarse, fever-up to 101.8, cough-starting to cause chest discomfort.  Thought feeling some better yest and worse today.   Pertinent negatives: covid and strep tests neg-med clinic [facility] on 5/22. Treatments tried: OTC Sick exposure: none   There are no preventive care reminders to display for this patient.  Past Medical History:  Diagnosis Date   ALLERGIC RHINITIS 02/02/2007   Allergy    Anemia    slight    ASTHMA 02/02/2007   Asthma    ATTENTION DEFICIT DISORDER, ADULT 05/14/2007   GERD 05/14/2007   symptoms resolved when became vegetarian   PONV (postoperative nausea and vomiting)     Past Surgical History:  Procedure Laterality Date   BREAST REDUCTION SURGERY     age 2- 6 lbs of tissue   EYE SURGERY     esotropia   LEEP     97-no abnormal paps since   REDUCTION MAMMAPLASTY Bilateral    TONSILLECTOMY     1996 or 1997    Outpatient Medications Prior to Visit  Medication Sig Dispense Refill   albuterol (PROAIR HFA) 108 (90 Base) MCG/ACT inhaler 2 puffs every six hours as needed. 8.5 g 1   Calcium Citrate Malate-Vit D (CALCIUM + D) 250-100 MG-UNIT TABS Take by mouth daily.     hydrOXYzine (VISTARIL) 25 MG capsule Take 1 capsule (25 mg total) by mouth every 8 (eight) hours as needed for anxiety. Dont drive for 8 hours after taking 30 capsule 2   Multiple Vitamin (MULTIVITAMIN) capsule Take 1 capsule by mouth daily.     tiZANidine (ZANAFLEX) 4 MG tablet tizanidine 4 mg tablet  TAKE 1 TABLET BY MOUTH EVERY 6 TO 8  HOURS AS NEEDED     lisdexamfetamine (VYVANSE) 30 MG capsule Vyvanse 30 mg capsule  TAKE ONE CAPSULE BY MOUTH DAILY BEFORE BREAKFAST (Patient not taking: Reported on 10/07/2021)     LO LOESTRIN FE 1 MG-10 MCG / 10 MCG tablet Take 1 tablet by mouth daily. (Patient not taking: Reported on 10/07/2021)     No facility-administered medications prior to visit.    No Known Allergies ROS neg/noncontributory except as noted HPI/below      Objective:     BP 120/66   Pulse 91   Temp (!) 100.5 F (38.1 C) (Temporal)   Ht '5\' 1"'$  (1.549 m)   Wt 171 lb 2 oz (77.6 kg)   SpO2 97%   BMI 32.33 kg/m  Wt Readings from Last 3 Encounters:  10/07/21 171 lb 2 oz (77.6 kg)  09/02/21 164 lb 3.2 oz (74.5 kg)  05/07/21 150 lb 3.2 oz (68.1 kg)    Physical Exam   Gen: WDWN NAD OWF HEENT: NCAT, conjunctiva not injected, sclera nonicteric TM WNL B, OP moist, no exudates   hoarse NECK:  supple, no thyromegaly, no nodes, no carotid bruits CARDIAC: RRR, S1S2+, no murmur. DP 2+B LUNGS: CTAB. No wheezes EXT:  no edema MSK: no gross abnormalities.  NEURO: A&O x3.  CN II-XII intact.  PSYCH: normal mood. Good eye contact     Assessment & Plan:   Problem List Items Addressed This Visit   None Visit Diagnoses     Bronchitis    -  Primary      Bronchitis-zpk.  Has albuterol if needed,  tessalon perles '100mg'$  tid prn.  Rest. Worse, sob, etc, to ER.  Meds ordered this encounter  Medications   azithromycin (ZITHROMAX) 250 MG tablet    Sig: Take 2 tablets on day 1, then 1 tablet daily on days 2 through 5    Dispense:  6 tablet    Refill:  0   benzonatate (TESSALON PERLES) 100 MG capsule    Sig: Take 1 capsule (100 mg total) by mouth 3 (three) times daily as needed.    Dispense:  20 capsule    Refill:  0    Wellington Hampshire, MD

## 2021-10-14 ENCOUNTER — Encounter: Payer: Self-pay | Admitting: Family Medicine

## 2021-10-15 MED ORDER — LISDEXAMFETAMINE DIMESYLATE 30 MG PO CAPS: ORAL_CAPSULE | ORAL | 0 refills | Status: DC

## 2021-11-12 ENCOUNTER — Ambulatory Visit: Payer: BC Managed Care – PPO | Admitting: Family Medicine

## 2021-11-12 ENCOUNTER — Encounter: Payer: Self-pay | Admitting: Family Medicine

## 2021-11-12 VITALS — BP 136/72 | HR 83 | Temp 97.6°F | Resp 16 | Ht 61.0 in | Wt 171.5 lb

## 2021-11-12 DIAGNOSIS — F325 Major depressive disorder, single episode, in full remission: Secondary | ICD-10-CM | POA: Insufficient documentation

## 2021-11-12 DIAGNOSIS — R5383 Other fatigue: Secondary | ICD-10-CM | POA: Diagnosis not present

## 2021-11-12 DIAGNOSIS — D500 Iron deficiency anemia secondary to blood loss (chronic): Secondary | ICD-10-CM | POA: Diagnosis not present

## 2021-11-12 DIAGNOSIS — E559 Vitamin D deficiency, unspecified: Secondary | ICD-10-CM

## 2021-11-12 DIAGNOSIS — D649 Anemia, unspecified: Secondary | ICD-10-CM | POA: Diagnosis not present

## 2021-11-12 DIAGNOSIS — F988 Other specified behavioral and emotional disorders with onset usually occurring in childhood and adolescence: Secondary | ICD-10-CM

## 2021-11-12 DIAGNOSIS — F320 Major depressive disorder, single episode, mild: Secondary | ICD-10-CM | POA: Diagnosis not present

## 2021-11-12 MED ORDER — BUPROPION HCL ER (XL) 150 MG PO TB24: 150.0000 mg | ORAL_TABLET | Freq: Every day | ORAL | 5 refills | Status: DC

## 2021-11-12 MED ORDER — HYDROXYZINE PAMOATE 25 MG PO CAPS: 25.0000 mg | ORAL_CAPSULE | Freq: Three times a day (TID) | ORAL | 3 refills | Status: DC | PRN

## 2021-11-12 NOTE — Progress Notes (Signed)
Phone [phone] In person visit   Subjective:   Amber Elliott is a 46 y.o. year old very pleasant female patient who presents for/with See problem oriented charting Chief Complaint  Patient presents with   Follow-up    Pt here for a f/u on anemia  She states she is doing well     Past Medical History-  Patient Active Problem List   Diagnosis Date Noted   Attention deficit disorder (ADD) in adult 06/09/2014    Priority: Medium    Asthma 02/02/2007    Priority: Medium    Former smoker 10/06/2014    Priority: Low   Allergic rhinitis 02/02/2007    Priority: Low   Depression, major, single episode, mild ([place]) 11/12/2021   Medications- reviewed and updated Current Outpatient Medications  Medication Sig Dispense Refill   albuterol (PROAIR HFA) 108 (90 Base) MCG/ACT inhaler 2 puffs every six hours as needed. 8.5 g 1   buPROPion (WELLBUTRIN XL) 150 MG 24 hr tablet Take 1 tablet (150 mg total) by mouth daily. 30 tablet 5   LO LOESTRIN FE 1 MG-10 MCG / 10 MCG tablet Take 1 tablet by mouth daily.     Multiple Vitamin (MULTIVITAMIN) capsule Take 1 capsule by mouth daily.     tiZANidine (ZANAFLEX) 4 MG tablet tizanidine 4 mg tablet  TAKE 1 TABLET BY MOUTH EVERY 6 TO 8 HOURS AS NEEDED     hydrOXYzine (VISTARIL) 25 MG capsule Take 1 capsule (25 mg total) by mouth every 8 (eight) hours as needed for anxiety. Dont drive for 8 hours after taking 90 capsule 3   No current facility-administered medications for this visit.     Objective:  BP 136/72   Pulse 83   Temp 97.6 F (36.4 C) (Temporal)   Resp 16   Ht '5\' 1"'$  (1.549 m)   Wt 171 lb 8 oz (77.8 kg)   SpO2 98%   BMI 32.40 kg/m  Gen: NAD, resting comfortably, appears more relaxed today    Assessment and Plan   #Anemia S: Patient with history of heavy menstrual cycles.  At last visit had significant depression of hemoglobin to 8.7 from prior baseline of 11.4 about a year ago. - Recommended stool cards- negative thankfully   -She was started on ferrous sulfate 325 mg every other day-later recommended Slow Fe due to nausea on regular iron- she changed to a purple bottle with heme and non heme iron and she tolerates this better and takes daily.  -still with fatigue but improving Lab Results  Component Value Date   WBC 6.7 09/02/2021   HGB 8.7 Repeated and verified X2. (L) 09/02/2021   HCT 28.0 (L) 09/02/2021   MCV 66.0 Repeated and verified X2. (L) 09/02/2021   PLT 336.0 09/02/2021  A/P: anemia likely related to heavy menstruation- check cbc and ferritin today- hopefully improving on iron  #Vitamin D deficiency/fatigued S: Medication: none A/P:  was told years ago had low vitamin D and not on low vitamin D. 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 ( has a short break first few weeks of July- going to [place] with mom) - She was planning on getting set up with therapy-initial therapist was not  the best fit and later had a visit with [facility]- Jocelyn Bumpers  - She was also working on setting healthier boundaries -For panic attacks did not want daily medicine but was willing to try hydroxyzine but we also considered Ativan as backup    11/12/2021    3:20 PM 09/02/2021    4:20 PM 10/21/2019    1:42 PM  Depression screen PHQ 2/9  Decreased Interest 2 0 0  Down, Depressed, Hopeless 1 0 0  PHQ - 2 Score 3 0 0  Altered sleeping 0 3   Tired, decreased energy 0 0   Change in appetite 2 0   Feeling bad or failure about yourself  2 0   Trouble concentrating 2 0   Moving slowly or fidgety/restless 0 0   Suicidal thoughts 0 0   PHQ-9 Score 9 3   Difficult doing work/chores Somewhat difficult  Not difficult at all   A/P: Patient has been working with therapist and seeing some strides-PHQ-9 is elevated (considering above 5 even with therapy I think medication is reasonable) and was diagnosed with depression by her therapist as well-they discussed potential medications and she mentioned Wellbutrin or Effexor-I think a trial of Wellbutrin would be reasonable given concurrent ADD (warned of possible worsening anxiety)- also with some panic and effexor may help more for that but we discussed withdrawal risks.  She has a big trip coming up to [place] for 2 weeks and she is going to wait to start until she gets back - Continue work with therapist-seems to be a very good fit -Suicide precautions given -no seizure history in regards to starting Wellbutrin  # ADD S:control: improved on meds but was having more insomnia so wants to hold until  certainly in better position overall from depression/anxiety medication: Vyvanse 30 mg rx A/P: Has not tolerated Vyvanse recently-may be able to tolerate better once depression better controlled but on the other hand Wellbutrin may be beneficial-start Wellbutrin as above  Recommended follow up: Return in about 7 weeks (around 12/31/2021) for followup or sooner if needed.Schedule b4 you leave. No future appointments.  Lab/Order associations:   ICD-10-CM   1. Iron deficiency anemia due to chronic blood loss  D50.0 CBC with Differential/Platelet    Ferritin    Ferritin    CBC with Differential/Platelet    2. Vitamin D deficiency  E55.9 VITAMIN D 25 Hydroxy (Vit-D Deficiency, Fractures)    VITAMIN D 25 Hydroxy (Vit-D Deficiency, Fractures)    3. Anemia, unspecified type  D64.9 Vitamin B12    Vitamin B12    4. Fatigue, unspecified type  R53.83 CBC with Differential/Platelet    Ferritin    VITAMIN D 25 Hydroxy (Vit-D Deficiency, Fractures)    Vitamin B12    Vitamin B12    VITAMIN D 25 Hydroxy (Vit-D Deficiency, Fractures)    Ferritin    CBC with  Differential/Platelet    5. Attention deficit disorder (ADD) in adult  F98.8     6. Depression, major, single episode, mild ([facility])  F32.0       Meds ordered this encounter  Medications   hydrOXYzine (VISTARIL) 25 MG capsule    Sig: Take 1 capsule (25 mg total) by mouth every 8 (eight) hours as needed for anxiety. Dont drive for 8 hours after taking    Dispense:  90 capsule    Refill:  3   buPROPion (WELLBUTRIN XL) 150 MG 24 hr tablet    Sig: Take 1 tablet (150  mg total) by mouth daily.    Dispense:  30 tablet    Refill:  5    Return precautions advised.  Garret Reddish, MD

## 2021-11-12 NOTE — Patient Instructions (Addendum)
Please stop by lab before you go If you have mychart- we will send your results within 3 business days of [place] receiving them.  If you do not have mychart- we will call you about results within 5 business days of [place] receiving them.  *please also note that you will see labs on mychart as soon as they post. I will later go in and write notes on them- will say "notes from Dr. Yong Channel"   When you get back from trip start wellbutrin '150mg'$  XR in the morning -any thoughts of self harm either before or after starting medicine let us know ASAP or call [phone]  Recommended follow up: Return in about 6 weeks (around 12/24/2021) for followup or sooner if needed.Schedule b4 you leave.

## 2021-11-12 NOTE — Assessment & Plan Note (Signed)
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- going to [place] with mom) - She was planning on getting set up with therapy-initial therapist was not the best fit and later had a visit with [facility]- Jocelyn Bumpers  - She was also working on setting healthier boundaries -For panic attacks did not want daily medicine but was willing to try hydroxyzine but we also considered Ativan as backup    11/12/2021    3:20 PM 09/02/2021    4:20 PM 10/21/2019    1:42 PM  Depression screen PHQ 2/9  Decreased Interest 2 0 0  Down, Depressed, Hopeless 1 0 0  PHQ - 2 Score 3 0 0  Altered sleeping 0 3   Tired, decreased energy 0 0   Change in appetite 2 0   Feeling bad or failure about yourself  2 0   Trouble concentrating 2 0   Moving slowly or fidgety/restless 0 0   Suicidal thoughts 0 0   PHQ-9 Score 9 3   Difficult doing work/chores Somewhat difficult Not difficult at all   A/P: Patient has been working with therapist and seeing some strides-PHQ-9 is elevated (considering above 5 even with therapy I think medication is reasonable) and was diagnosed with depression by her therapist as well-they discussed potential medications and she mentioned Wellbutrin or Effexor-I think a trial of Wellbutrin would be reasonable given concurrent ADD (warned of possible worsening anxiety)- also with some panic and effexor may help more for that but we discussed withdrawal risks- has hydroxyzine on hand to use if needed.  She has a big trip coming up to [place] for 2 weeks and she is going to wait to start until she gets back - Continue work with therapist-seems to be a very good fit -Suicide precautions given -no seizure history in regards to starting Wellbutrin

## 2021-11-13 LAB — CBC WITH DIFFERENTIAL/PLATELET
Absolute Monocytes: 476 cells/uL (ref 200–950)
Basophils Absolute: 51 cells/uL (ref 0–200)
Basophils Relative: 0.6 %
Eosinophils Absolute: 638 cells/uL — ABNORMAL HIGH (ref 15–500)
Eosinophils Relative: 7.5 %
HCT: 34.2 % — ABNORMAL LOW (ref 35.0–45.0)
Hemoglobin: 10.2 g/dL — ABNORMAL LOW (ref 11.7–15.5)
Lymphs Abs: 3162 cells/uL (ref 850–3900)
MCH: 20.8 pg — ABNORMAL LOW (ref 27.0–33.0)
MCHC: 29.8 g/dL — ABNORMAL LOW (ref 32.0–36.0)
MCV: 69.7 fL — ABNORMAL LOW (ref 80.0–100.0)
MPV: 9.7 fL (ref 7.5–12.5)
Monocytes Relative: 5.6 %
Neutro Abs: 4174 cells/uL (ref 1500–7800)
Neutrophils Relative %: 49.1 %
Platelets: 346 10*3/uL (ref 140–400)
RBC: 4.91 10*6/uL (ref 3.80–5.10)
RDW: 20.1 % — ABNORMAL HIGH (ref 11.0–15.0)
Total Lymphocyte: 37.2 %
WBC: 8.5 10*3/uL (ref 3.8–10.8)

## 2021-11-13 LAB — VITAMIN B12: Vitamin B-12: 509 pg/mL (ref 200–1100)

## 2021-11-13 LAB — VITAMIN D 25 HYDROXY (VIT D DEFICIENCY, FRACTURES): Vit D, 25-Hydroxy: 41 ng/mL (ref 30–100)

## 2021-11-13 LAB — FERRITIN: Ferritin: 3 ng/mL — ABNORMAL LOW (ref 16–232)

## 2021-11-18 ENCOUNTER — Ambulatory Visit: Payer: BC Managed Care – PPO | Admitting: Family Medicine

## 2021-12-27 ENCOUNTER — Encounter: Payer: Self-pay | Admitting: Family Medicine

## 2021-12-27 ENCOUNTER — Ambulatory Visit: Payer: BC Managed Care – PPO | Admitting: Family Medicine

## 2021-12-27 VITALS — BP 102/70 | HR 83 | Temp 97.6°F | Ht 61.0 in | Wt 172.4 lb

## 2021-12-27 DIAGNOSIS — F411 Generalized anxiety disorder: Secondary | ICD-10-CM | POA: Diagnosis not present

## 2021-12-27 DIAGNOSIS — F410 Panic disorder [episodic paroxysmal anxiety] without agoraphobia: Secondary | ICD-10-CM | POA: Diagnosis not present

## 2021-12-27 DIAGNOSIS — F320 Major depressive disorder, single episode, mild: Secondary | ICD-10-CM | POA: Diagnosis not present

## 2021-12-27 DIAGNOSIS — D500 Iron deficiency anemia secondary to blood loss (chronic): Secondary | ICD-10-CM

## 2021-12-27 NOTE — Patient Instructions (Addendum)
Flu shot- we should have these available within a month or two but please let us know if you get at outside pharmacy  No changes today- hoping for significant improvement with new role, continued meds, continued therapy   Recommended follow up: Return in about 3 months (around 03/29/2022) for followup or sooner if needed.Schedule b4 you leave.

## 2021-12-27 NOTE — Progress Notes (Signed)
Phone [phone] In person visit   Subjective:   Amber Elliott is a 46 y.o. year old very pleasant female patient who presents for/with See problem oriented charting Chief Complaint  Patient presents with   ADD    Past Medical History-  Patient Active Problem List   Diagnosis Date Noted   Attention deficit disorder (ADD) in adult 06/09/2014    Priority: Medium    Asthma 02/02/2007    Priority: Medium    Former smoker 10/06/2014    Priority: Low   Allergic rhinitis 02/02/2007    Priority: Low   GAD (generalized anxiety disorder) 12/27/2021   Panic attacks 12/27/2021   Depression, major, single episode, mild ([place]) 11/12/2021    Medications- reviewed and updated Current Outpatient Medications  Medication Sig Dispense Refill   albuterol (PROAIR HFA) 108 (90 Base) MCG/ACT inhaler 2 puffs every six hours as needed. 8.5 g 1   buPROPion (WELLBUTRIN XL) 150 MG 24 hr tablet Take 1 tablet (150 mg total) by mouth daily. 30 tablet 5   hydrOXYzine (VISTARIL) 25 MG capsule Take 1 capsule (25 mg total) by mouth every 8 (eight) hours as needed for anxiety. Dont drive for 8 hours after taking 90 capsule 3   Multiple Vitamin (MULTIVITAMIN) capsule Take 1 capsule by mouth daily.     tiZANidine (ZANAFLEX) 4 MG tablet tizanidine 4 mg tablet  TAKE 1 TABLET BY MOUTH EVERY 6 TO 8 HOURS AS NEEDED     LO LOESTRIN FE 1 MG-10 MCG / 10 MCG tablet Take 1 tablet by mouth daily.     No current facility-administered medications for this visit.     Objective:  BP 102/70   Pulse 83   Temp 97.6 F (36.4 C)   Ht '5\' 1"'$  (1.549 m)   Wt 172 lb 6.4 oz (78.2 kg)   SpO2 98%   BMI 32.57 kg/m  Gen: NAD, resting comfortably    Assessment and Plan   #Anemia/fatigue S: Patient with history of heavy menstrual cycles-earlier this year hemoglobin got as low as 8.7.  Stool cards negative -After several trials of iron she started a purple bottle with heme and nonheme iron and is tolerating this much better  even daily - At last visit anemia was improving as well as cell size/MCV though ferritin was still low -fatigue improving overall A/P: much improved- continue current medicine- recheck next visit    # Anxiety/depression S:Medication: Hydroxyzine 25 mg for sleep-last visit started Wellbutrin (no seizure history-we also considered Effexor) to see if concurrent ADD benefit (but warned of potential worsening anxiety)  - she was feeling much better over the summer with anxiety. 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   - Counseling: Working with Verdie Drown bumpers with [employer] [facility]- once a week    11/12/2021    3:20 PM 09/02/2021    4:20 PM 10/21/2019    1:42 PM  Depression screen PHQ 2/9  Decreased Interest 2 0 0  Down, Depressed, Hopeless 1 0 0  PHQ - 2 Score 3 0 0  Altered sleeping 0 3   Tired, decreased energy 0 0   Change in appetite 2 0   Feeling bad or failure about yourself  2 0   Trouble concentrating 2 0   Moving slowly or  fidgety/restless 0 0   Suicidal thoughts 0 0   PHQ-9 Score 9 3   Difficult doing work/chores Somewhat difficult Not difficult at all   A/P: PHQ9 elevated to 9 still with no SI and GAD7 up to 17- this seems largely situational- was much better controlled before potential restart of school at [employer]. 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  I think this is a much better solution for her. Given that anxiety and depression were controlled during summer months- believe continuing current medicine is appropriate- wellbutrin in the daytime and sparing  hydroxyzine at night (particularly lately when starting back at curriculum facilitator). In addition under 30 days so far on meds so may see further benefit -filled out FMLA in regards to [employer]/curriculum facilitator role  #Adult ADD S: medication : Vyvanse 30 mg in the past but was causing insomnia.  A/P: mild improvement on wellbutrin- less binging and less circular thinking - will continue current meds  Recommended follow up: Return in about 3 months (around 03/29/2022) for followup or sooner if needed.Schedule b4 you leave.  Lab/Order associations:   ICD-10-CM   1. Depression, major, single episode, mild ([place])  F32.0     2. GAD (generalized anxiety disorder)  F41.1     3. Panic attacks  F41.0     4. Iron deficiency anemia due to chronic blood loss  D50.0       Return precautions advised.  Garret Reddish, MD

## 2022-01-12 ENCOUNTER — Other Ambulatory Visit: Payer: Self-pay | Admitting: Family Medicine

## 2022-02-07 ENCOUNTER — Encounter: Payer: Self-pay | Admitting: *Deleted

## 2022-02-17 DIAGNOSIS — F341 Dysthymic disorder: Secondary | ICD-10-CM | POA: Diagnosis not present

## 2022-02-17 DIAGNOSIS — F4312 Post-traumatic stress disorder, chronic: Secondary | ICD-10-CM | POA: Diagnosis not present

## 2022-02-17 DIAGNOSIS — F410 Panic disorder [episodic paroxysmal anxiety] without agoraphobia: Secondary | ICD-10-CM | POA: Diagnosis not present

## 2022-02-28 ENCOUNTER — Other Ambulatory Visit: Payer: Self-pay | Admitting: Family Medicine

## 2022-02-28 DIAGNOSIS — Z1231 Encounter for screening mammogram for malignant neoplasm of breast: Secondary | ICD-10-CM

## 2022-03-17 DIAGNOSIS — F341 Dysthymic disorder: Secondary | ICD-10-CM | POA: Diagnosis not present

## 2022-03-17 DIAGNOSIS — F410 Panic disorder [episodic paroxysmal anxiety] without agoraphobia: Secondary | ICD-10-CM | POA: Diagnosis not present

## 2022-03-17 DIAGNOSIS — F4312 Post-traumatic stress disorder, chronic: Secondary | ICD-10-CM | POA: Diagnosis not present

## 2022-03-25 ENCOUNTER — Ambulatory Visit (INDEPENDENT_AMBULATORY_CARE_PROVIDER_SITE_OTHER): Payer: BC Managed Care – PPO | Admitting: Family Medicine

## 2022-03-25 ENCOUNTER — Encounter: Payer: Self-pay | Admitting: Family Medicine

## 2022-03-25 VITALS — BP 120/70 | HR 89 | Temp 98.2°F | Ht 61.0 in | Wt 174.6 lb

## 2022-03-25 DIAGNOSIS — F411 Generalized anxiety disorder: Secondary | ICD-10-CM

## 2022-03-25 DIAGNOSIS — J329 Chronic sinusitis, unspecified: Secondary | ICD-10-CM

## 2022-03-25 DIAGNOSIS — D509 Iron deficiency anemia, unspecified: Secondary | ICD-10-CM

## 2022-03-25 DIAGNOSIS — J4521 Mild intermittent asthma with (acute) exacerbation: Secondary | ICD-10-CM

## 2022-03-25 DIAGNOSIS — F988 Other specified behavioral and emotional disorders with onset usually occurring in childhood and adolescence: Secondary | ICD-10-CM | POA: Diagnosis not present

## 2022-03-25 DIAGNOSIS — B9689 Other specified bacterial agents as the cause of diseases classified elsewhere: Secondary | ICD-10-CM

## 2022-03-25 DIAGNOSIS — F320 Major depressive disorder, single episode, mild: Secondary | ICD-10-CM

## 2022-03-25 MED ORDER — AMOXICILLIN-POT CLAVULANATE 875-125 MG PO TABS: 1.0000 | ORAL_TABLET | Freq: Two times a day (BID) | ORAL | 0 refills | Status: AC

## 2022-03-25 NOTE — Patient Instructions (Addendum)
Health Maintenance Due  Topic Date Due   MAMMOGRAM  02/22/2022  Already scheduled  Asthma is flared- I want you to start the old medrol dose pack from last year. Sinuses also very inflamed and with pressures/congestion for over a month likely bacterial- so start augmentin- could wait until Sunday for medrol dose pack and start the augmentin tonight -if not improving please let us know- hoping by Monday or Tuesday you are really turning the corner  depression much improved. We strongly considered stopping Wellbutrin as situational trigger has resolved. When we thought more thoroughly about this- we opted to wait until at least after the holidays - sometime between January and march can trial off then we can check in at may or late April physical For GAD- controlled- can continue hydroxyzine as needed for anxiety element regardless of whether taking Wellbutrin or not  Please stop by lab before you go If you have mychart- we will send your results within 3 business days of [place] receiving them.  If you do not have mychart- we will call you about results within 5 business days of [place] receiving them.  *please also note that you will see labs on mychart as soon as they post. I will later go in and write notes on them- will say "notes from Dr. Yong Channel"   Recommended follow up: Return in about 6 months (around 09/23/2022) for physical or sooner if needed.Schedule b4 you leave.

## 2022-03-25 NOTE — Progress Notes (Signed)
Phone [phone] In person visit   Subjective:   Amber Elliott is a 46 y.o. year old very pleasant female patient who presents for/with See problem oriented charting Chief Complaint  Patient presents with   Follow-up    Pt c/o upper respiratory thing for 4 weeks and can't get rid of the congestion and ear pressure and covid test negative   Depression   ADD   Past Medical History-  Patient Active Problem List   Diagnosis Date Noted   GAD (generalized anxiety disorder) 12/27/2021    Priority: Medium    Panic attacks 12/27/2021    Priority: Medium    Depression, major, single episode, mild ([place]) 11/12/2021    Priority: Medium    Attention deficit disorder (ADD) in adult 06/09/2014    Priority: Medium    Asthma 02/02/2007    Priority: Medium    Former smoker 10/06/2014    Priority: Low   Allergic rhinitis 02/02/2007    Priority: Low    Medications- reviewed and updated Current Outpatient Medications  Medication Sig Dispense Refill   amoxicillin-clavulanate (AUGMENTIN) 875-125 MG tablet Take 1 tablet by mouth 2 (two) times daily for 7 days. 14 tablet 0   Norethin-Eth Estrad-Fe Biphas (LO LOESTRIN FE PO) Take by mouth.     albuterol (VENTOLIN HFA) 108 (90 Base) MCG/ACT inhaler INHALE 2 PUFFS BY MOUTH EVERY 6 HOURS AS NEEDED 8.5 g 1   buPROPion (WELLBUTRIN XL) 150 MG 24 hr tablet Take 1 tablet (150 mg total) by mouth daily. 30 tablet 5   hydrOXYzine (VISTARIL) 25 MG capsule Take 1 capsule (25 mg total) by mouth every 8 (eight) hours as needed for anxiety. Dont drive for 8 hours after taking 90 capsule 3   Multiple Vitamin (MULTIVITAMIN) capsule Take 1 capsule by mouth daily.     tiZANidine (ZANAFLEX) 4 MG tablet tizanidine 4 mg tablet  TAKE 1 TABLET BY MOUTH EVERY 6 TO 8 HOURS AS NEEDED     No current facility-administered medications for this visit.     Objective:  BP 120/70   Pulse 89   Temp 98.2 F (36.8 C)   Ht '5\' 1"'$  (1.549 m)   Wt 174 lb 9.6 oz (79.2 kg)    SpO2 99%   BMI 32.99 kg/m  Gen: NAD, resting comfortably Nasal turbinates extremely edematous and with thicker discharge noted, tympanic membranes appear to have some our pressure-possible fluid behind them CV: RRR no murmurs rubs or gallops Lungs: Diffuse wheeze--no crackles or rhonchi Skin: warm, dry no rash    Assessment and Plan   # Depression S: Medication: Wellbutrin 150 mg extended release, hydroxyzine at night- off and on depending on the week.  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 Now at a new school/position and stress/anxiety/depression much improved- clearly had strong situational element to it.  -side benefit appetite stabilized    03/25/2022    3:26 PM 12/27/2021    2:04 PM 11/12/2021    3:20 PM  Depression screen PHQ 2/9  Decreased Interest 0 0 2  Down, Depressed, Hopeless 0 0 1  PHQ - 2 Score 0 0 3  Altered sleeping 0 3 0  Tired, decreased energy 0 0 0  Change in appetite 0 1 2  Feeling bad or failure about yourself  0 3 2  Trouble concentrating 0 2 2  Moving slowly or fidgety/restless 0 0  0  Suicidal thoughts 0 0 0  PHQ-9 Score 0 9 9  Difficult doing work/chores Not difficult at all Somewhat difficult Somewhat difficult  A/P: depression much improved. We strongly considered stopping Wellbutrin as situational trigger has resolved. When we thought more thoroughly about this- we opted to wait until at least after the holidays - sometime between January and march can trial off then we can check in at may or late April physical For GAD- controlled- can continue hydroxyzine as needed for anxiety element regardless of whether taking Wellbutrin or not  # ADD S:control: reasonable medication:  Vyvanse in the past but was causing insomnia.  Had mild improvement last visit on Wellbutrin-less binging and less circular thinking and we opted to continue  current medications A/P: reasonable control without stimulant- when trials off wellbutrin she will monitor ADD as well   #anemia- still on birth control to regulate cycles- currently on menstruation cycle day 1  -on iron supplement - we will update CBC and ferritin today Lab Results  Component Value Date   WBC 8.5 11/12/2021   HGB 10.2 (L) 11/12/2021   HCT 34.2 (L) 11/12/2021   MCV 69.7 (L) 11/12/2021   PLT 346 11/12/2021   # cough/nasal congestion/ sinus pressure/chest congestion/wheeze S:started about 4 weeks ago. Started out with head cold and later spiked fever. Later lost voice and had yellow nasal congestion (has cleared up but still milky and thick) as well as sputum production. Ears with more popping- no pain. Some sinus pain. In last week felt some rattling in upper chest. Mild wheezing-albuterol helps mildly. No shortness of breath. Multiple negative covid tests.  -mucinex DM- mild help -quit smoking 2001- no recurrence -no current allergy medicine- has not recently been bothered outside of these symptoms A/P: Significant sinus irritation on exam and over a month of symptoms-treat with Augmentin to cover bacterial sinusitis - Also appears to have flared her asthma-significant wheeze on exam-had issues last year was prescribed medrol dose pack but she never took it-she agrees to start this on Sunday (give antibiotics some time to address bacterial sinus infection)  Recommended follow up: Return in about 6 months (around 09/23/2022) for physical or sooner if needed.Schedule b4 you leave. Future Appointments  Date Time Provider [place]  04/11/2022  4:30 PM [facility] 3 [facility]   Lab/Order associations:   ICD-10-CM   1. Iron deficiency anemia, unspecified iron deficiency anemia type  D50.9 CBC with Differential/Platelet    Iron, TIBC and Ferritin Panel    CANCELED: CBC with Differential/Platelet    CANCELED: IBC + Ferritin    2. Depression, major, single  episode, mild ([place])  F32.0     3. GAD (generalized anxiety disorder)  F41.1     4. Attention deficit disorder (ADD) in adult  F98.8     5. Bacterial sinusitis  J32.9    B96.89     6. Mild intermittent asthma with exacerbation  J45.21       Meds ordered this encounter  Medications   amoxicillin-clavulanate (AUGMENTIN) 875-125 MG tablet    Sig: Take 1 tablet by mouth 2 (two) times daily for 7 days.    Dispense:  14 tablet    Refill:  0    Return precautions advised.  Garret Reddish, MD

## 2022-03-26 LAB — IRON,TIBC AND FERRITIN PANEL
%SAT: 7 % (calc) — ABNORMAL LOW (ref 16–45)
Ferritin: 3 ng/mL — ABNORMAL LOW (ref 16–232)
Iron: 36 ug/dL — ABNORMAL LOW (ref 40–190)
TIBC: 499 mcg/dL (calc) — ABNORMAL HIGH (ref 250–450)

## 2022-03-26 LAB — CBC WITH DIFFERENTIAL/PLATELET
Absolute Monocytes: 518 cells/uL (ref 200–950)
Basophils Absolute: 41 cells/uL (ref 0–200)
Basophils Relative: 0.5 %
Eosinophils Absolute: 219 cells/uL (ref 15–500)
Eosinophils Relative: 2.7 %
HCT: 35.5 % (ref 35.0–45.0)
Hemoglobin: 11.5 g/dL — ABNORMAL LOW (ref 11.7–15.5)
Lymphs Abs: 2252 cells/uL (ref 850–3900)
MCH: 25.8 pg — ABNORMAL LOW (ref 27.0–33.0)
MCHC: 32.4 g/dL (ref 32.0–36.0)
MCV: 79.8 fL — ABNORMAL LOW (ref 80.0–100.0)
MPV: 9.9 fL (ref 7.5–12.5)
Monocytes Relative: 6.4 %
Neutro Abs: 5071 cells/uL (ref 1500–7800)
Neutrophils Relative %: 62.6 %
Platelets: 343 10*3/uL (ref 140–400)
RBC: 4.45 10*6/uL (ref 3.80–5.10)
RDW: 16.4 % — ABNORMAL HIGH (ref 11.0–15.0)
Total Lymphocyte: 27.8 %
WBC: 8.1 10*3/uL (ref 3.8–10.8)

## 2022-03-28 ENCOUNTER — Encounter: Payer: Self-pay | Admitting: Family Medicine

## 2022-03-31 DIAGNOSIS — F4312 Post-traumatic stress disorder, chronic: Secondary | ICD-10-CM | POA: Diagnosis not present

## 2022-03-31 DIAGNOSIS — F410 Panic disorder [episodic paroxysmal anxiety] without agoraphobia: Secondary | ICD-10-CM | POA: Diagnosis not present

## 2022-03-31 DIAGNOSIS — F341 Dysthymic disorder: Secondary | ICD-10-CM | POA: Diagnosis not present

## 2022-04-11 ENCOUNTER — Ambulatory Visit
Admission: RE | Admit: 2022-04-11 | Discharge: 2022-04-11 | Disposition: A | Discharge status: Home / Self Care | Payer: BC Managed Care – PPO | Source: Ambulatory Visit

## 2022-04-11 DIAGNOSIS — Z1231 Encounter for screening mammogram for malignant neoplasm of breast: Secondary | ICD-10-CM | POA: Diagnosis not present

## 2022-04-13 ENCOUNTER — Ambulatory Visit (INDEPENDENT_AMBULATORY_CARE_PROVIDER_SITE_OTHER): Payer: BC Managed Care – PPO | Admitting: Internal Medicine

## 2022-04-13 ENCOUNTER — Encounter: Payer: Self-pay | Admitting: Internal Medicine

## 2022-04-13 VITALS — BP 131/79 | HR 88 | Temp 98.4°F | Resp 14 | Ht 61.0 in | Wt 180.2 lb

## 2022-04-13 DIAGNOSIS — J4541 Moderate persistent asthma with (acute) exacerbation: Secondary | ICD-10-CM | POA: Diagnosis not present

## 2022-04-13 DIAGNOSIS — J329 Chronic sinusitis, unspecified: Secondary | ICD-10-CM | POA: Diagnosis not present

## 2022-04-13 DIAGNOSIS — D509 Iron deficiency anemia, unspecified: Secondary | ICD-10-CM | POA: Diagnosis not present

## 2022-04-13 MED ORDER — FLUTICASONE PROPIONATE 50 MCG/ACT NA SUSP: 2.0000 | Freq: Every day | NASAL | 6 refills | Status: DC

## 2022-04-13 MED ORDER — SIMPLY SALINE 0.9 % NA AERS: 2.0000 | INHALATION_SPRAY | Freq: Every day | NASAL | 1 refills | Status: DC | PRN

## 2022-04-13 MED ORDER — FLUTICASONE-SALMETEROL 115-21 MCG/ACT IN AERO: 2.0000 | INHALATION_SPRAY | Freq: Two times a day (BID) | RESPIRATORY_TRACT | 12 refills | Status: DC

## 2022-04-13 MED ORDER — PREDNISONE 20 MG PO TABS: ORAL_TABLET | ORAL | 0 refills | Status: DC

## 2022-04-13 MED ORDER — LORATADINE 10 MG PO TABS: 10.0000 mg | ORAL_TABLET | Freq: Every day | ORAL | 11 refills | Status: DC

## 2022-04-13 MED ORDER — AMOXICILLIN-POT CLAVULANATE 875-125 MG PO TABS: 1.0000 | ORAL_TABLET | Freq: Two times a day (BID) | ORAL | 0 refills | Status: DC

## 2022-04-13 NOTE — Patient Instructions (Signed)
It was a pleasure seeing you today!  Your health and satisfaction are my top priorities. If you believe your experience today was worthy of a 5-star rating, I'd be grateful for your feedback! Loralee Pacas, MD   AT CHECKOUT:  '[]'$    Sign release of information at the check out desk for: Any records we need for your care and to be your medical home  '[]'$    Schedule next appointment:  No follow-ups on file.  If you are not doing well:  Return to the office sooner  Please bring all your medicines to each appointment If your condition begins to worsen or become severe:  GO to the ER   CLINICAL PLAN REMINDERS: Your checklist to help you remember today's clinical plan  '[]'$    Follow up if you cough up blood  or get more short of breath just go to ER.  If you improve great no follow up needed.  For your xray: Please go to our [place] office to get your xrays done. You can walk in M-F between 8:30am- noon or 1pm - 5pm. Tell them you are there for xrays ordered by me. They will send me the results, then I will let you know the results with instructions.   Address: [address]  The Xray department is located in the basement.    '[]'$   (Optional):  Review your clinical notes on MyChart after they are completed.     Today's draft of the physician documented plan for today's visit: (final revisions will be visible on MyChart chart later) Moderate persistent asthma with acute exacerbation -     Fluticasone-Salmeterol; Inhale 2 puffs into the lungs 2 (two) times daily.  Dispense: 1 each; Refill: 12 -     predniSONE; Take 2 pills for 3 days, 1 pill for 4 days  Dispense: 10 tablet; Refill: 0 -     Amoxicillin-Pot Clavulanate; Take 1 tablet by mouth 2 (two) times daily.  Dispense: 20 tablet; Refill: 0 -     DG Chest 2 View; Future  Chronic sinusitis, unspecified location -     Amoxicillin-Pot Clavulanate; Take 1 tablet by mouth 2 (two) times daily.  Dispense: 20 tablet; Refill: 0 -      Fluticasone Propionate; Place 2 sprays into both nostrils daily.  Dispense: 16 g; Refill: 6 -     Simply Saline; Place 2 each into the nose daily as needed.  Dispense: 500 mL; Refill: 1 -     Loratadine; Take 1 tablet (10 mg total) by mouth daily.  Dispense: 30 tablet; Refill: 11  Iron deficiency anemia, unspecified iron deficiency anemia type -     Ambulatory referral to Gastroenterology      QUESTIONS & CONCERNS: CLINICAL: please contact me via phone [phone] OR MyChart messaging  LAB & IMAGING:   We will call you if the results are significantly abnormal or you don't use MyChart.  Most normal results will be posted to MyChart immediately and have a clinical review message by Dr. Randol Kern posted within 2-3 business days.   If you have not heard from [place] regarding the results in 2 weeks OR if you need priority reporting, please contact this office. MYCHART:  The fastest way to get your results and easiest way to stay in touch with [place] is by activating your My Chart account. Instructions are located on the last page of this paperwork.  BILLING: xray and lab orders are billed from separate  companies and questions./concerns should be directed to [employer].  For visit charges please discuss with our administrative services COMPLAINTS:  please let Dr. Randol Kern know or see the [place], by asking at the front desk: we want you to be satisfied with every experience and we would be grateful for the opportunity to address any problems

## 2022-04-13 NOTE — Progress Notes (Signed)
[facility]: [phone]   Routine Medical Office Visit  Patient:  Amber Elliott      Age: 46 y.o.       Sex:  female  Date:   04/13/2022  PCP:    Marin Olp, MD    [place] Provider: Loralee Pacas, MD  Assessment/Plan:    Ermagene was seen today for wheezing and shortness of breath.  This is a persistent severe respiratory condition of uncertain etiology/cause for persistent exacerbation.   Potentially serious underlying cause, although most likely just chronic sinusitis with post nasal drip causing flare.   She also happens to have severe iron deficiency anemia for which cause remains unidentified making the level of concern higher, so she agrees to see gastrointestinal specialist   Moderate persistent asthma with acute exacerbation Overview: Albuterol once a month. No controller.    Orders: -     Fluticasone-Salmeterol; Inhale 2 puffs into the lungs 2 (two) times daily.  Dispense: 1 each; Refill: 12 -     predniSONE; Take 2 pills for 3 days, 1 pill for 4 days  Dispense: 10 tablet; Refill: 0 -     DG Chest 2 View; Future  Chronic sinusitis, unspecified location -     Amoxicillin-Pot Clavulanate; Take 1 tablet by mouth 2 (two) times daily.  Dispense: 20 tablet; Refill: 0 -     Fluticasone Propionate; Place 2 sprays into both nostrils daily.  Dispense: 16 g; Refill: 6 -     Simply Saline; Place 2 each into the nose daily as needed.  Dispense: 500 mL; Refill: 1 -     Loratadine; Take 1 tablet (10 mg total) by mouth daily.  Dispense: 30 tablet; Refill: 11  Iron deficiency anemia, unspecified iron deficiency anemia type -     Ambulatory referral to Gastroenterology    Follow up as needed, based on symptom(s) progression or failure to resolve   Today's key discussion points and AVS reminders.  She was encouraged to contact our office by phone or message via MyChart if she has any questions or concerns regarding our treatment plan  (see AVS).  We discussed red flag symptoms and signs in detail and when to call the office or go to ER if her condition worsens (see AVS). She expressed understanding.  No barriers to understanding were identified  She was given an opportunity to ask questions/clarifications about all of the above.    Subjective:   Amber Elliott is a 46 y.o. female with past medical history including: Past Medical History:  Diagnosis Date   ALLERGIC RHINITIS 02/02/2007   Allergy    Anemia    slight    ASTHMA 02/02/2007   Asthma    ATTENTION DEFICIT DISORDER, ADULT 05/14/2007   GERD 05/14/2007   symptoms resolved when became vegetarian   PONV (postoperative nausea and vomiting)      She presented today reporting reason for visit as: Chief Complaint  Patient presents with   Wheezing    Using inhaler four or five times a day, helps temporarily. Coughed up yellowish mucus, has seen small specks of blood. Has had copper taste in mouth. Had a cold for a month (lingering symptoms).   Shortness of Breath    For about six weeks, had some relief  previously from prescribed antibiotics and steroids (temporary). Has taken multiple COVID tests, all were negative (most recent-three days ago).     Comprehensive Review of the Case: The patient is  a 46 year old female who has been experiencing persistent upper respiratory symptoms, including wheezing and nasal congestion, for over a month. There was a brief period of relief after a 7-day course of prednisone and Augmentin, but the symptoms returned and have continued for the last week. She denies any fevers, fatigue, or body aches. Her past medical history includes significant blood loss and iron deficiency from an unknown source, and a CT scan in 2005 revealed chronic bilateral maxillary and ethmoid sinusitis. She has been experiencing more nasal congestion than usual this week, and her nasal mucosa appears normal on examination, but her lungs are wheezy in all  fields. Occasionally, she tastes copper in her mouth and sees specks of blood. Her symptoms are briefly alleviated by an albuterol inhaler. She has no personal or family history of blood clots or risk factors for blood clots, and she is not on blood thinners.    Below is the artificial intelligence diagnostic thoughts (in green) which I only partially agree with I think that she has allergic reaction of some kind causing asthma exacerbation, maybe post nasal drip but cannot identify any exposures to explain.  The specks of blood in the mouth of unknown origin are a little bit concerning for ?hemoptysis but she denies coughing up any blood in the she does not taste in her mouth right after a cough and she has no risk factors and there was no sudden onset I think looking for PE with a D-dimer is going to be misleading because of her recent bleeding and so we are going to just hold off on that but I did talk to her about the need to go to the hospital if she feels sudden worsening him to be evaluated for that.  However I think she will get better just like she did a week ago but just taking the same treatment again and in addition adding on a better inhaler and hopefully by extending the antibiotic course she can eradicate the sinus infection which I do believe is the cause even though her nasal mucosa looks fine.  We asked that if this plan fails and she does not improve that she needs to let us know right away or if she worsens she needs to go to the ER she will probably need another visit and I am thinking may be a CAT scan of the sinuses lungs or both to figure out next steps if this persists despite treatment.   I will go ahead and get a chest x-ray for the pulmonary aspergillosis concern and the lung cancer concern but she is not losing weight and not that ill  Chronic Sinusitis: The patient's persistent upper respiratory symptoms, including nasal congestion and wheezing, along with her history of chronic  bilateral maxillary and ethmoid sinusitis, suggest a diagnosis of chronic sinusitis. The brief relief of symptoms following a course of prednisone and Augmentin, which are commonly used to treat sinusitis, further supports this diagnosis. The presence of purulent nasal discharge or facial pain could further suggest this diagnosis.  Expanded DDx:  Bronchiectasis: Bronchiectasis could explain the patient's persistent wheezing, coughing, and the presence of blood specks, which could be due to recurrent infections and inflammation leading to damage and widening of the bronchial tubes. The presence of chronic productive cough and recurrent respiratory infections could further suggest this diagnosis.  Gastroesophageal Reflux Disease (GERD): GERD could explain the patient's symptoms of wheezing and the taste of copper in her mouth, which could be due to stomach  acid refluxing into the esophagus and possibly the airways. The presence of heartburn, regurgitation, and chest pain could further suggest this diagnosis.  Alternative DDx:  Cystic Fibrosis (CF): CF could explain the patient's persistent respiratory symptoms and the presence of blood specks, which could be due to recurrent respiratory infections and damage to the airways. The presence of salty-tasting skin, frequent lung infections, and poor growth or weight gain in childhood could further suggest this diagnosis.  Allergic Bronchopulmonary Aspergillosis (ABPA): ABPA could explain the patient's persistent respiratory symptoms and the presence of blood specks, which could be due to an allergic reaction to the fungus Aspergillus. The presence of asthma or cystic fibrosis, and symptoms worsening despite treatment, could further suggest this diagnosis.  Primary Ciliary Dyskinesia (PCD): PCD could explain the patient's persistent respiratory symptoms and history of sinusitis, which could be due to abnormal ciliary function leading to difficulty clearing mucus  from the lungs and sinuses. The presence of chronic ear infections, situs inversus, and infertility could further suggest this diagnosis.  Pulmonary Embolism (PE): Although the patient has no personal or family history of blood clots or risk factors for blood clots, PE could still explain the patient's respiratory symptoms and the presence of blood specks. The presence of sudden onset of symptoms, chest pain, and shortness of breath could further suggest this diagnosis.  Lung Cancer: Lung cancer could explain the patient's persistent respiratory symptoms and the presence of blood specks. The presence of weight loss, hoarseness, and chest pain could further suggest this diagnosis.  Tuberculosis (TB): TB could explain the patient's persistent respiratory symptoms and the presence of blood specks, which could be due to a chronic bacterial infection of the lungs. The presence of night sweats, weight loss, and a positive TB skin test or interferon-gamma release assay could further suggest this diagnosis.        Objective:  Physical Exam: BP 131/79 (BP Location: Right Arm, Patient Position: Sitting)   Pulse 88   Temp 98.4 F (36.9 C) (Temporal)   Resp 14   Ht '5\' 1"'$  (1.549 m)   Wt 180 lb 3.2 oz (81.7 kg)   SpO2 97%   BMI 34.05 kg/m   Problem-specific physical exam findings:  Physical Exam Vitals and nursing note reviewed.  Constitutional:      General: She is not in acute distress.    Appearance: Normal appearance. She is not ill-appearing, toxic-appearing or diaphoretic.  HENT:     Head: Normocephalic and atraumatic.     Nose: Nose normal.     Mouth/Throat:     Mouth: Mucous membranes are moist.     Pharynx: No pharyngeal swelling or oropharyngeal exudate.  Eyes:     Conjunctiva/sclera: Conjunctivae normal.  Neck:     Thyroid: No thyromegaly.  Cardiovascular:     Rate and Rhythm: Normal rate and regular rhythm.     Heart sounds: Normal heart sounds. No murmur heard.    No friction  rub. No gallop.  Pulmonary:     Effort: Pulmonary effort is normal.     Breath sounds: Examination of the right-upper field reveals wheezing. Examination of the left-upper field reveals wheezing. Examination of the right-middle field reveals wheezing. Examination of the left-middle field reveals wheezing. Examination of the right-lower field reveals wheezing. Examination of the left-lower field reveals wheezing. Wheezing present. No rhonchi or rales.  Chest:     Chest wall: No deformity.  Abdominal:     Palpations: Abdomen is soft.  Skin:  General: Skin is warm and dry.     Coloration: Skin is not jaundiced or pale.     Findings: No bruising.  Neurological:     General: No focal deficit present.     Mental Status: She is alert.  Psychiatric:        Attention and Perception: Attention normal.        Mood and Affect: Mood normal.        Speech: Speech normal.        Behavior: Behavior normal.        Thought Content: Thought content normal.        Judgment: Judgment normal.        Results Reviewed:  No results found for any visits on 04/13/22.   Recent Results (from the past 2160 hour(s))  Iron, TIBC and Ferritin Panel     Status: Abnormal   Collection Time: 03/25/22  3:59 PM  Result Value Ref Range   Iron 36 (L) 40 - 190 mcg/dL   TIBC 499 (H) 250 - 450 mcg/dL (calc)   %SAT 7 (L) 16 - 45 % (calc)   Ferritin 3 (L) 16 - 232 ng/mL  CBC with Differential/Platelet     Status: Abnormal   Collection Time: 03/25/22  3:59 PM  Result Value Ref Range   WBC 8.1 3.8 - 10.8 Thousand/uL   RBC 4.45 3.80 - 5.10 Million/uL   Hemoglobin 11.5 (L) 11.7 - 15.5 g/dL   HCT 35.5 35.0 - 45.0 %   MCV 79.8 (L) 80.0 - 100.0 fL   MCH 25.8 (L) 27.0 - 33.0 pg   MCHC 32.4 32.0 - 36.0 g/dL   RDW 16.4 (H) 11.0 - 15.0 %   Platelets 343 140 - 400 Thousand/uL   MPV 9.9 7.5 - 12.5 fL   Neutro Abs 5,071 1,500 - 7,800 cells/uL   Lymphs Abs 2,252 850 - 3,900 cells/uL   Absolute Monocytes 518 200 - 950  cells/uL   Eosinophils Absolute 219 15 - 500 cells/uL   Basophils Absolute 41 0 - 200 cells/uL   Neutrophils Relative % 62.6 %   Total Lymphocyte 27.8 %   Monocytes Relative 6.4 %   Eosinophils Relative 2.7 %   Basophils Relative 0.5 %

## 2022-04-14 ENCOUNTER — Encounter: Payer: Self-pay | Admitting: Internal Medicine

## 2022-04-14 ENCOUNTER — Ambulatory Visit (INDEPENDENT_AMBULATORY_CARE_PROVIDER_SITE_OTHER)
Admission: RE | Admit: 2022-04-14 | Discharge: 2022-04-14 | Disposition: A | Discharge status: Home / Self Care | Payer: BC Managed Care – PPO | Source: Ambulatory Visit | Attending: Internal Medicine | Admitting: Internal Medicine

## 2022-04-14 DIAGNOSIS — J45909 Unspecified asthma, uncomplicated: Secondary | ICD-10-CM | POA: Diagnosis not present

## 2022-04-14 DIAGNOSIS — R059 Cough, unspecified: Secondary | ICD-10-CM | POA: Diagnosis not present

## 2022-04-14 DIAGNOSIS — J4541 Moderate persistent asthma with (acute) exacerbation: Secondary | ICD-10-CM | POA: Diagnosis not present

## 2022-04-15 ENCOUNTER — Other Ambulatory Visit: Payer: Self-pay | Admitting: Internal Medicine

## 2022-04-15 ENCOUNTER — Ambulatory Visit: Payer: BC Managed Care – PPO | Admitting: Family Medicine

## 2022-04-15 DIAGNOSIS — J4541 Moderate persistent asthma with (acute) exacerbation: Secondary | ICD-10-CM

## 2022-04-15 MED ORDER — FLUTICASONE-SALMETEROL 115-21 MCG/ACT IN AERO: 2.0000 | INHALATION_SPRAY | Freq: Two times a day (BID) | RESPIRATORY_TRACT | 12 refills | Status: DC

## 2022-04-20 ENCOUNTER — Encounter: Payer: Self-pay | Admitting: Physician Assistant

## 2022-05-02 ENCOUNTER — Encounter: Payer: Self-pay | Admitting: Family Medicine

## 2022-05-02 NOTE — Telephone Encounter (Signed)
Please see pt msg and advise 

## 2022-05-02 NOTE — Telephone Encounter (Signed)
Please see pt message and advise 

## 2022-05-10 DIAGNOSIS — M545 Low back pain, unspecified: Secondary | ICD-10-CM | POA: Diagnosis not present

## 2022-05-27 ENCOUNTER — Encounter: Payer: Self-pay | Admitting: *Deleted

## 2022-05-30 ENCOUNTER — Other Ambulatory Visit (INDEPENDENT_AMBULATORY_CARE_PROVIDER_SITE_OTHER): Payer: BC Managed Care – PPO

## 2022-05-30 ENCOUNTER — Encounter: Payer: Self-pay | Admitting: Physician Assistant

## 2022-05-30 ENCOUNTER — Ambulatory Visit: Payer: BC Managed Care – PPO | Admitting: Physician Assistant

## 2022-05-30 VITALS — BP 132/94 | HR 96 | Ht 61.0 in | Wt 187.0 lb

## 2022-05-30 DIAGNOSIS — D509 Iron deficiency anemia, unspecified: Secondary | ICD-10-CM

## 2022-05-30 DIAGNOSIS — K219 Gastro-esophageal reflux disease without esophagitis: Secondary | ICD-10-CM

## 2022-05-30 LAB — CBC WITH DIFFERENTIAL/PLATELET
Basophils Absolute: 0 10*3/uL (ref 0.0–0.1)
Basophils Relative: 0.9 % (ref 0.0–3.0)
Eosinophils Absolute: 0.5 10*3/uL (ref 0.0–0.7)
Eosinophils Relative: 9.9 % — ABNORMAL HIGH (ref 0.0–5.0)
HCT: 37.1 % (ref 36.0–46.0)
Hemoglobin: 12 g/dL (ref 12.0–15.0)
Lymphocytes Relative: 43.3 % (ref 12.0–46.0)
Lymphs Abs: 2.3 10*3/uL (ref 0.7–4.0)
MCHC: 32.4 g/dL (ref 30.0–36.0)
MCV: 78.7 fl (ref 78.0–100.0)
Monocytes Absolute: 0.4 10*3/uL (ref 0.1–1.0)
Monocytes Relative: 8.5 % (ref 3.0–12.0)
Neutro Abs: 2 10*3/uL (ref 1.4–7.7)
Neutrophils Relative %: 37.4 % — ABNORMAL LOW (ref 43.0–77.0)
Platelets: 317 10*3/uL (ref 150.0–400.0)
RBC: 4.71 Mil/uL (ref 3.87–5.11)
RDW: 16.3 % — ABNORMAL HIGH (ref 11.5–15.5)
WBC: 5.3 10*3/uL (ref 4.0–10.5)

## 2022-05-30 MED ORDER — POLYSACCHARIDE IRON COMPLEX 150 MG PO CAPS: 150.0000 mg | ORAL_CAPSULE | Freq: Two times a day (BID) | ORAL | 6 refills | Status: DC

## 2022-05-30 MED ORDER — NA SULFATE-K SULFATE-MG SULF 17.5-3.13-1.6 GM/177ML PO SOLN: ORAL | 0 refills | Status: DC

## 2022-05-30 NOTE — Patient Instructions (Signed)
You have been scheduled for an endoscopy and colonoscopy. Please follow the written instructions given to you at your visit today. Please pick up your prep supplies at the pharmacy within the next 1-3 days. If you use inhalers (even only as needed), please bring them with you on the day of your procedure.  Your provider has requested that you go to the basement level for lab work before leaving today. Press "B" on the elevator. The lab is located at the first door on the left as you exit the elevator.  We have sent the following medications to your pharmacy for you to pick up at your convenience: NuIron- Take 1 capsule by mouth twice daily  _______________________________________________________  If your blood pressure at your visit was 140/90 or greater, please contact your primary care physician to follow up on this.  _______________________________________________________  If you are age 68 or older, your body mass index should be between 23-30. Your Body mass index is 35.33 kg/m. If this is out of the aforementioned range listed, please consider follow up with your Primary Care Provider.  If you are age 46 or younger, your body mass index should be between 19-25. Your Body mass index is 35.33 kg/m. If this is out of the aformentioned range listed, please consider follow up with your Primary Care Provider.   ________________________________________________________  The [facility] providers would like to encourage you to use [facility] to communicate with providers for non-urgent requests or questions.  Due to long hold times on the telephone, sending your provider a message by [facility] may be a faster and more efficient way to get a response.  Please allow 48 business hours for a response.  Please remember that this is for non-urgent requests.  _______________________________________________________  Due to recent changes in healthcare laws, you may see the results of your imaging and  laboratory studies on MyChart before your provider has had a chance to review them.  We understand that in some cases there may be results that are confusing or concerning to you. Not all laboratory results come back in the same time frame and the provider may be waiting for multiple results in order to interpret others.  Please give [place] 48 hours in order for your provider to thoroughly review all the results before contacting the office for clarification of your results.

## 2022-05-30 NOTE — Progress Notes (Signed)
Subjective:    Patient ID: Amber Elliott, female    DOB: 05-11-1976, 47 y.o.   MRN: [id number]  HPI Amber Elliott is a pleasant 47 year old white female,  referred by Dr. Garret Reddish for evaluation of iron deficiency anemia.  She is known to Dr. Tarri Glenn from prior colonoscopy in October 2021 which is done for screening.  She had 3 small polyps removed one was a sessile serrated polyp 1 hyperplastic and 1 benign polypoid tissue.  She was indicated for 5-year interval follow-up.   She does have history of ADD, anxiety and asthma. She was initially noted to be mildly anemic in July 2021 with hemoglobin 11.4, but had drop in hemoglobin when checked in April 2023 with hemoglobin of 8.7/hematocrit 28.0/MCV of 66.  She was heme-negative at that time and had a ferritin of 3.  She was started on oral iron.  This was a 28 mg iron formulation, which she has been taking once daily.  In June 2023 B12 normal at 509, hemoglobin up to 10.2 hematocrit 34.2 and MCV 69 At last check November 2023 hemoglobin 11.5/hematocrit 35.5 MCV 79 serum iron 36 TIBC 499 iron sat 7 and ferritin of 3  She does not have history of menorrhagia and in fact says her menstrual cycle has been erratic and lighter over the past couple of years.  She had been tested for celiac disease at 1 point several years ago and was told this was negative.  No regular aspirin or NSAID use.  He has not noted any melena or hematochezia, she has had some recent reflux symptoms which she attributes to weight gain over the past year.  She has had some vague dysphagia noted on the left side of her pharynx.  Her bowel movements have not been as regular as in past years and will alternate between constipation and soft stools.  Family history is pertinent for father with gastric cancer, grandmother with polyps, brother with ulcerative colitis and a cousin with celiac disease..  Overall she says she feels okay, had been quite fatigued when her hemoglobin was  lower and still feels fatigued though improved over earlier in the summer.  Review of Systems Pertinent positive and negative review of systems were noted in the above HPI section.  All other review of systems was otherwise negative.   Outpatient Encounter Medications as of 05/30/2022  Medication Sig   albuterol (VENTOLIN HFA) 108 (90 Base) MCG/ACT inhaler INHALE 2 PUFFS BY MOUTH EVERY 6 HOURS AS NEEDED   iron polysaccharides (NU-IRON) 150 MG capsule Take 1 capsule (150 mg total) by mouth 2 (two) times daily. Take with food   Multiple Vitamin (MULTIVITAMIN) capsule Take 1 capsule by mouth daily.   Na Sulfate-K Sulfate-Mg Sulf 17.5-3.13-1.6 GM/177ML SOLN Use as directed; may use generic; goodrx card if insurance will not cover generic   tiZANidine (ZANAFLEX) 4 MG tablet    amoxicillin-clavulanate (AUGMENTIN) 875-125 MG tablet Take 1 tablet by mouth 2 (two) times daily.   buPROPion (WELLBUTRIN XL) 150 MG 24 hr tablet Take 1 tablet (150 mg total) by mouth daily. (Patient not taking: Reported on 05/30/2022)   fluticasone (FLONASE) 50 MCG/ACT nasal spray Place 2 sprays into both nostrils daily.   fluticasone-salmeterol (ADVAIR HFA) 115-21 MCG/ACT inhaler Inhale 2 puffs into the lungs 2 (two) times daily. (Patient not taking: Reported on 05/30/2022)   hydrOXYzine (VISTARIL) 25 MG capsule Take 1 capsule (25 mg total) by mouth every 8 (eight) hours as needed for anxiety. Dont drive  for 8 hours after taking (Patient not taking: Reported on 05/30/2022)   loratadine (CLARITIN) 10 MG tablet Take 1 tablet (10 mg total) by mouth daily.   Norethin-Eth Estrad-Fe Biphas (LO LOESTRIN FE PO) Take by mouth.   predniSONE (DELTASONE) 20 MG tablet Take 2 pills for 3 days, 1 pill for 4 days   Saline (SIMPLY SALINE) 0.9 % AERS Place 2 each into the nose daily as needed.   No facility-administered encounter medications on file as of 05/30/2022.   No Known Allergies Patient Active Problem List   Diagnosis Date Noted   GAD  (generalized anxiety disorder) 12/27/2021   Panic attacks 12/27/2021   Depression, major, single episode, mild ([place]) 11/12/2021   Former smoker 10/06/2014   Attention deficit disorder (ADD) in adult 06/09/2014   Allergic rhinitis 02/02/2007   Asthma 02/02/2007   Social History   Socioeconomic History   Marital status: Single    Spouse name: Not on file   Number of children: Not on file   Years of education: Not on file   Highest education level: Not on file  Occupational History   Not on file  Tobacco Use   Smoking status: Former    Packs/day: 0.75    Years: 10.00    Total pack years: 7.50    Types: Cigarettes    Quit date: 05/17/1999    Years since quitting: 23.0   Smokeless tobacco: Never  Vaping Use   Vaping Use: Never used  Substance and Sexual Activity   Alcohol use: Yes    Comment: socially   Drug use: No   Sexual activity: Yes    Birth control/protection: Condom  Other Topics Concern   Not on file  Social History Narrative   Single- lives alone but close to mom. No children.       [employer] starting August 2023.    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       Hobbies: reading, walks   Social Determinants of Health   Financial Resource Strain: Not on file  Food Insecurity: Not on file  Transportation Needs: Not on file  Physical Activity: Not on file  Stress: Not on file  Social Connections: Not on file  Intimate Partner Violence: Not on file    Amber Elliott's family history includes AAA (abdominal aortic aneurysm) in her mother; Alcohol abuse in her paternal grandmother; Arthritis in her mother; Colon polyps in her maternal grandmother; Diabetes in her half-brother; Drug abuse in her half-sister; Healthy in her half-brother and half-brother; Heart failure  in her maternal grandmother; Other in her father, half-brother, and mother; Stomach cancer in her father.      Objective:    Vitals:   05/30/22 0829 05/30/22 0905  BP: (!) 136/94 (!) 132/94  Pulse: 96     Physical Exam Well-developed well-nourished white female in no acute distress.  Height, Weight, 187 BMI 35.3  HEENT; nontraumatic normocephalic, EOMI, PE R LA, sclera anicteric. Oropharynx; examined today Neck; supple, no JVD Cardiovascular; regular rate and rhythm with S1-S2, no murmur rub or gallop Pulmonary; Clear bilaterally Abdomen; soft, nontender, nondistended, no palpable mass or hepatosplenomegaly, bowel sounds are active Rectal; not done today, documented heme-negative April 2023 Skin; benign exam, no jaundice rash or appreciable lesions Extremities; no clubbing cyanosis or edema  skin warm and dry Neuro/Psych; alert and oriented x4, grossly nonfocal mood and affect appropriate        Assessment & Plan:   #41  47 year old white female with iron deficiency anemia. Hemoglobin 8.20 August 2021 and ferritin of 3. He has been on an oral iron supplement since then with improvement in hemoglobin but still quite iron deficient with ferritin remaining in the 3 level and iron sat of 7/iron 36.  No issues with menorrhagia. Will need to rule out intermittent occult GI blood loss/chronic.  Rule out chronic gastropathy, AVMs, occult neoplasm. Rule out persistent iron deficiency secondary to inadequate replacement versus malabsorption  2 history of sessile serrated colon polyp and hyperplastic colon polyp-at colonoscopy October 2021 had been indicated for 5-year interval follow-up  #3 anxiety #4.  ADD #5.  Asthma #6 GERD, mild no current Rx  Plan; patient will be scheduled for EGD and colonoscopy with Dr. Tarri Glenn.  Both procedures were discussed in detail with the patient including indications risk and benefits and she is agreeable to proceed. Repeat CBC today Stop current iron  supplement and start Nu-Iron 150 p.o. twice daily to be taken with food. Will need to repeat iron studies in about 3 months.  If she does not have good response to new iron may need IV iron replacement. Further recommendations pending results of endoscopic evaluation.    Amber Elliott Genia Harold PA-C 05/30/2022   Cc: Marin Olp, MD

## 2022-05-31 NOTE — Progress Notes (Signed)
Reviewed and agree with management plans. ? ?Amber Caraveo L. Ashtian Villacis, MD, MPH  ?

## 2022-06-20 ENCOUNTER — Encounter: Payer: Self-pay | Admitting: Gastroenterology

## 2022-06-26 ENCOUNTER — Encounter: Payer: Self-pay | Admitting: Certified Registered Nurse Anesthetist

## 2022-06-27 ENCOUNTER — Encounter: Payer: Self-pay | Admitting: Gastroenterology

## 2022-06-27 ENCOUNTER — Ambulatory Visit (AMBULATORY_SURGERY_CENTER): Payer: BC Managed Care – PPO | Admitting: Gastroenterology

## 2022-06-27 ENCOUNTER — Other Ambulatory Visit: Payer: Self-pay | Admitting: Gastroenterology

## 2022-06-27 VITALS — BP 124/73 | HR 71 | Temp 97.5°F | Resp 12 | Ht 61.0 in | Wt 187.0 lb

## 2022-06-27 DIAGNOSIS — D509 Iron deficiency anemia, unspecified: Secondary | ICD-10-CM

## 2022-06-27 DIAGNOSIS — K319 Disease of stomach and duodenum, unspecified: Secondary | ICD-10-CM | POA: Diagnosis not present

## 2022-06-27 DIAGNOSIS — K219 Gastro-esophageal reflux disease without esophagitis: Secondary | ICD-10-CM

## 2022-06-27 DIAGNOSIS — Z09 Encounter for follow-up examination after completed treatment for conditions other than malignant neoplasm: Secondary | ICD-10-CM

## 2022-06-27 DIAGNOSIS — K2980 Duodenitis without bleeding: Secondary | ICD-10-CM | POA: Diagnosis not present

## 2022-06-27 DIAGNOSIS — K229 Disease of esophagus, unspecified: Secondary | ICD-10-CM | POA: Diagnosis not present

## 2022-06-27 DIAGNOSIS — Z86010 Personal history of colonic polyps: Secondary | ICD-10-CM

## 2022-06-27 DIAGNOSIS — Z8719 Personal history of other diseases of the digestive system: Secondary | ICD-10-CM

## 2022-06-27 DIAGNOSIS — K317 Polyp of stomach and duodenum: Secondary | ICD-10-CM | POA: Diagnosis not present

## 2022-06-27 HISTORY — DX: Personal history of other diseases of the digestive system: Z87.19

## 2022-06-27 HISTORY — PX: COLONOSCOPY WITH ESOPHAGOGASTRODUODENOSCOPY (EGD): SHX5779

## 2022-06-27 MED ORDER — PANTOPRAZOLE SODIUM 40 MG PO TBEC: 40.0000 mg | DELAYED_RELEASE_TABLET | Freq: Every day | ORAL | 1 refills | Status: DC

## 2022-06-27 MED ORDER — SODIUM CHLORIDE 0.9 % IV SOLN: 500.0000 mL | Freq: Once | INTRAVENOUS | Status: DC

## 2022-06-27 NOTE — Op Note (Signed)
[facility] Patient Name: Amber Elliott Procedure Date: 06/27/2022 1:24 PM MRN: [id number] Endoscopist: Thornton Park MD, MD, [id number] Age: 47 Referring MD:  Date of Birth: 1976/02/18 Gender: Female Account #: [id number] Procedure:                Upper GI endoscopy Indications:              Unexplained iron deficiency anemia, father with                            gastric cancer Medicines:                Monitored Anesthesia Care Procedure:                Pre-Anesthesia Assessment:                           - Prior to the procedure, a History and Physical                            was performed, and patient medications and                            allergies were reviewed. The patient's tolerance of                            previous anesthesia was also reviewed. The risks                            and benefits of the procedure and the sedation                            options and risks were discussed with the patient.                            All questions were answered, and informed consent                            was obtained. Prior Anticoagulants: The patient has                            taken no anticoagulant or antiplatelet agents. ASA                            Grade Assessment: II - A patient with mild systemic                            disease. After reviewing the risks and benefits,                            the patient was deemed in satisfactory condition to                            undergo the procedure.  After obtaining informed consent, the endoscope was                            passed under direct vision. Throughout the                            procedure, the patient's blood pressure, pulse, and                            oxygen saturations were monitored continuously. The                            GIF [id number] was introduced through the                            mouth, and advanced to the second part  of duodenum.                            The upper GI endoscopy was accomplished without                            difficulty. The patient tolerated the procedure                            well. Scope In: Scope Out: Findings:                 LA Grade A (one or more mucosal breaks less than 5                            mm, not extending between tops of 2 mucosal folds)                            esophagitis with no bleeding was found 36 cm from                            the incisors. Biopsies were taken with a cold                            forceps for histology. Estimated blood loss was                            minimal.                           Localized mildly erythematous mucosa without                            bleeding was found in the gastric body. Biopsies                            were taken from the antrum, body, and fundus with a  cold forceps for histology. Estimated blood loss                            was minimal.                           A few small sessile polyps with no bleeding and no                            stigmata of recent bleeding were found in the                            gastric fundus and in the gastric body. Biopsies                            were taken with a cold forceps for histology.                            Estimated blood loss was minimal.                           The cardia and gastric fundus were normal on                            retroflexion.                           The exam was otherwise without abnormality. Complications:            No immediate complications. Estimated Blood Loss:     Estimated blood loss was minimal. Estimated blood                            loss was minimal. Impression:               - LA Grade A reflux esophagitis with no bleeding.                            Biopsied.                           - Erythematous mucosa in the gastric body. Biopsied.                           - Normal  examined duodenum. Biopsied.                           - The examination was otherwise normal. Recommendation:           - Patient has a contact number available for                            emergencies. The signs and symptoms of potential                            delayed complications were discussed with the  patient. Return to normal activities tomorrow.                            Written discharge instructions were provided to the                            patient.                           - Resume previous diet.                           - Continue present medications.                           - Pantoprazole 40 mg QAM x 8 weeks.                           - Reflux lifestyle modifications.                           - Await pathology results. Thornton Park MD, MD 06/27/2022 1:56:55 PM This report has been signed electronically.

## 2022-06-27 NOTE — Op Note (Signed)
[place] Patient Name: Amber Elliott Procedure Date: 06/27/2022 1:24 PM MRN: [id number] Endoscopist: Thornton Park MD, MD, [id number] Age: 47 Referring MD:  Date of Birth: 09/04/1975 Gender: Female Account #: [id number] Procedure:                Colonoscopy Indications:              Unexplained iron deficiency anemia                           Screening colonoscopy October 2021 showed 3 small                            polyps. One was a sessile serrated polyp 1                            hyperplastic and 1 benign polypoid tissue. Medicines:                Monitored Anesthesia Care Procedure:                Pre-Anesthesia Assessment:                           - Prior to the procedure, a History and Physical                            was performed, and patient medications and                            allergies were reviewed. The patient's tolerance of                            previous anesthesia was also reviewed. The risks                            and benefits of the procedure and the sedation                            options and risks were discussed with the patient.                            All questions were answered, and informed consent                            was obtained. Prior Anticoagulants: The patient has                            taken no anticoagulant or antiplatelet agents. ASA                            Grade Assessment: II - A patient with mild systemic                            disease. After reviewing the risks and benefits,  the patient was deemed in satisfactory condition to                            undergo the procedure.                           After obtaining informed consent, the colonoscope                            was passed under direct vision. Throughout the                            procedure, the patient's blood pressure, pulse, and                            oxygen saturations were monitored  continuously. The                            CF [id number] was introduced through the anus                            and advanced to the 3 cm into the ileum. The                            colonoscopy was performed without difficulty. The                            patient tolerated the procedure well. The quality                            of the bowel preparation was good. The terminal                            ileum, ileocecal valve, appendiceal orifice, and                            rectum were photographed. Scope In: 1:40:07 PM Scope Out: 1:51:03 PM Scope Withdrawal Time: 0 hours 8 minutes 7 seconds  Total Procedure Duration: 0 hours 10 minutes 56 seconds  Findings:                 The perianal and digital rectal examinations were                            normal.                           The colon (entire examined portion) appeared normal.                           The terminal ileum appeared normal.                           The exam was otherwise without abnormality on  direct and retroflexion views. Complications:            No immediate complications. Estimated Blood Loss:     Estimated blood loss: none. Impression:               - Normal exam.                           - No source for anemia identified. Recommendation:           - Patient has a contact number available for                            emergencies. The signs and symptoms of potential                            delayed complications were discussed with the                            patient. Return to normal activities tomorrow.                            Written discharge instructions were provided to the                            patient.                           - Resume previous diet.                           - Continue present medications.                           - Await pathology results.                           - Repeat colonoscopy in 5 years for surveillance                             given the history of polyps.                           - Plan capsule endoscopy for further evaluation.                           - Emerging evidence supports eating a diet of                            fruits, vegetables, grains, calcium, and yogurt                            while reducing red meat and alcohol may reduce the                            risk of colon cancer. Thornton Park MD, MD 06/27/2022 1:59:48 PM This report has been signed electronically.

## 2022-06-27 NOTE — Progress Notes (Signed)
Report given to PACU, vss 

## 2022-06-27 NOTE — Patient Instructions (Signed)
Discharge instructions given. Biopsies taken. Prescription sent to pharmacy. Resume previous medications. YOU HAD AN ENDOSCOPIC PROCEDURE TODAY AT [facility]:   Refer to the procedure report that was given to you for any specific questions about what was found during the examination.  If the procedure report does not answer your questions, please call your gastroenterologist to clarify.  If you requested that your care partner not be given the details of your procedure findings, then the procedure report has been included in a sealed envelope for you to review at your convenience later.  YOU SHOULD EXPECT: Some feelings of bloating in the abdomen. Passage of more gas than usual.  Walking can help get rid of the air that was put into your GI tract during the procedure and reduce the bloating. If you had a lower endoscopy (such as a colonoscopy or flexible sigmoidoscopy) you may notice spotting of blood in your stool or on the toilet paper. If you underwent a bowel prep for your procedure, you may not have a normal bowel movement for a few days.  Please Note:  You might notice some irritation and congestion in your nose or some drainage.  This is from the oxygen used during your procedure.  There is no need for concern and it should clear up in a day or so.  SYMPTOMS TO REPORT IMMEDIATELY:  Following lower endoscopy (colonoscopy or flexible sigmoidoscopy):  Excessive amounts of blood in the stool  Significant tenderness or worsening of abdominal pains  Swelling of the abdomen that is new, acute  Fever of 100F or higher  Following upper endoscopy (EGD)  Vomiting of blood or coffee ground material  New chest pain or pain under the shoulder blades  Painful or persistently difficult swallowing  New shortness of breath  Fever of 100F or higher  Black, tarry-looking stools  For urgent or emergent issues, a gastroenterologist can be reached at any hour by calling [phone]  [phone]. Do not use MyChart messaging for urgent concerns.    DIET:  We do recommend a small meal at first, but then you may proceed to your regular diet.  Drink plenty of fluids but you should avoid alcoholic beverages for 24 hours.  ACTIVITY:  You should plan to take it easy for the rest of today and you should NOT DRIVE or use heavy machinery until tomorrow (because of the sedation medicines used during the test).    FOLLOW UP: Our staff will call the number listed on your records the next business day following your procedure.  We will call around 7:15- 8:00 am to check on you and address any questions or concerns that you may have regarding the information given to you following your procedure. If we do not reach you, we will leave a message.     If any biopsies were taken you will be contacted by phone or by letter within the next 1-3 weeks.  Please call us at [phone] if you have not heard about the biopsies in 3 weeks.    SIGNATURES/CONFIDENTIALITY: You and/or your care partner have signed paperwork which will be entered into your electronic medical record.  These signatures attest to the fact that that the information above on your After Visit Summary has been reviewed and is understood.  Full responsibility of the confidentiality of this discharge information lies with you and/or your care-partner.

## 2022-06-27 NOTE — Progress Notes (Signed)
1315 Robinul 0.1 mg IV given due large amount of secretions upon assessment.  MD made aware, vss  

## 2022-06-27 NOTE — Progress Notes (Signed)
Called to room to assist during endoscopic procedure.  Patient ID and intended procedure confirmed with present staff. Received instructions for my participation in the procedure from the performing physician.  

## 2022-06-27 NOTE — Progress Notes (Signed)
Pt's states no medical or surgical changes since previsit or office visit. 

## 2022-06-27 NOTE — Progress Notes (Signed)
Indication for upper endoscopy: Iron deficiency anemia, father with gastric cancer Indication for colonoscopy: Iron deficiency anemia  Please see the 05/30/2022 office note for complete details.  There is been no change in history or physical exam since that time.  The patient remains an appropriate candidate for monitored anesthesia care in the endoscopy center today.

## 2022-06-28 ENCOUNTER — Telehealth: Payer: Self-pay | Admitting: *Deleted

## 2022-06-28 NOTE — Telephone Encounter (Signed)
  Follow up Call-     06/27/2022   12:53 PM 03/09/2020    7:50 AM  Call back number  Post procedure Call Back phone  # [phone]  Permission to leave phone message Yes Yes     Patient questions:  Do you have a fever, pain , or abdominal swelling? No. Pain Score  0 *  Have you tolerated food without any problems? Yes.    Have you been able to return to your normal activities? Yes.    Do you have any questions about your discharge instructions: Diet   No. Medications  No. Follow up visit  No.  Do you have questions or concerns about your Care? No.  Actions: * If pain score is 4 or above: No action needed, pain <4.

## 2022-06-30 ENCOUNTER — Telehealth: Payer: Self-pay | Admitting: Pharmacy Technician

## 2022-06-30 NOTE — Telephone Encounter (Signed)
Patient Advocate Encounter  Prior Authorization for PANTOPRAZOLE 40MG has been approved.    PA# -- Effective dates: 2.15.24 through 2.13.25   Received notification from [facility] that prior authorization for PANTOPRAZOLE 40MG is required.   PA submitted on 2.15.24 Key [id number] Status is pending   PT has try/fail of [place]

## 2022-07-02 DIAGNOSIS — Z9049 Acquired absence of other specified parts of digestive tract: Secondary | ICD-10-CM | POA: Diagnosis not present

## 2022-07-02 DIAGNOSIS — M79605 Pain in left leg: Secondary | ICD-10-CM | POA: Diagnosis not present

## 2022-07-02 DIAGNOSIS — L03116 Cellulitis of left lower limb: Secondary | ICD-10-CM | POA: Diagnosis not present

## 2022-07-02 DIAGNOSIS — L539 Erythematous condition, unspecified: Secondary | ICD-10-CM | POA: Diagnosis not present

## 2022-07-02 DIAGNOSIS — M7989 Other specified soft tissue disorders: Secondary | ICD-10-CM | POA: Diagnosis not present

## 2022-07-02 DIAGNOSIS — Z9851 Tubal ligation status: Secondary | ICD-10-CM | POA: Diagnosis not present

## 2022-07-02 DIAGNOSIS — I10 Essential (primary) hypertension: Secondary | ICD-10-CM | POA: Diagnosis not present

## 2022-07-02 DIAGNOSIS — Z880 Allergy status to penicillin: Secondary | ICD-10-CM | POA: Diagnosis not present

## 2022-07-04 ENCOUNTER — Telehealth: Payer: Self-pay | Admitting: Gastroenterology

## 2022-07-04 NOTE — Telephone Encounter (Signed)
Patient returning Steven's call in regards to Pathology results.

## 2022-07-04 NOTE — Telephone Encounter (Signed)
Left message for pt to call back  °

## 2022-07-05 ENCOUNTER — Telehealth: Payer: Self-pay

## 2022-07-05 NOTE — Telephone Encounter (Signed)
Pt made aware of pathology results. Documented in result notes. Pt verbalized understanding with all questions answered.

## 2022-07-05 NOTE — Telephone Encounter (Signed)
Per 06/27/22 pathology result note - pt would like OV prior to scheduling VCE. She has an appt on 08/25/22 at 3:40 pm.

## 2022-07-05 NOTE — Telephone Encounter (Signed)
-----   Message from Thornton Park, MD sent at 06/27/2022  1:52 PM EST ----- Please schedule capsule endoscopy for unexplained iron deficiency.  Thanks.  KLB

## 2022-07-08 ENCOUNTER — Telehealth: Payer: Self-pay | Admitting: Gastroenterology

## 2022-07-08 DIAGNOSIS — D509 Iron deficiency anemia, unspecified: Secondary | ICD-10-CM

## 2022-07-08 NOTE — Telephone Encounter (Signed)
Inbound call from patient requesting a call back to discuss scheduling a capsule endoscopy. Please advise.

## 2022-07-11 NOTE — Telephone Encounter (Signed)
The pt has been scheduled for capsule endo on 08/08/22 at 830 am. Instructions have been sent to the pt via My Chart and discussed over the phone.  Pt does view her My Chart messages.

## 2022-08-01 ENCOUNTER — Telehealth (INDEPENDENT_AMBULATORY_CARE_PROVIDER_SITE_OTHER): Payer: BC Managed Care – PPO | Admitting: Family Medicine

## 2022-08-01 ENCOUNTER — Encounter: Payer: Self-pay | Admitting: Family Medicine

## 2022-08-01 DIAGNOSIS — J4520 Mild intermittent asthma, uncomplicated: Secondary | ICD-10-CM | POA: Diagnosis not present

## 2022-08-01 DIAGNOSIS — U071 COVID-19: Secondary | ICD-10-CM | POA: Diagnosis not present

## 2022-08-01 MED ORDER — NIRMATRELVIR/RITONAVIR (PAXLOVID)TABLET: 3.0000 | ORAL_TABLET | Freq: Two times a day (BID) | ORAL | 0 refills | Status: DC

## 2022-08-01 MED ORDER — NIRMATRELVIR/RITONAVIR (PAXLOVID)TABLET: 3.0000 | ORAL_TABLET | Freq: Two times a day (BID) | ORAL | 0 refills | Status: AC

## 2022-08-01 MED ORDER — GUAIFENESIN-CODEINE 100-10 MG/5ML PO SOLN: 5.0000 mL | Freq: Four times a day (QID) | ORAL | 0 refills | Status: DC | PRN

## 2022-08-01 NOTE — Progress Notes (Signed)
Virtual Visit via Video Note  Subjective  CC:  Chief Complaint  Patient presents with   Covid Positive    Pt stated that she tested positive on 08/01/2022. Cough and fatigue.     I connected with Levi Aland on 08/01/22 at 11:15 AM EDT by a video enabled telemedicine application and verified that I am speaking with the correct person using two identifiers. Location patient: Home Location provider: [facility], Office Persons participating in the virtual visit: SKYLER PEDROTTI, Amber Arnt, MD Darlina Rumpf CMA  I discussed the limitations of evaluation and management by telemedicine and the availability of in person appointments. The patient expressed understanding and agreed to proceed. HPI: Amber Elliott is a 47 y.o. female who was contacted today to address the problems listed above in the chief complaint. Covid sxs: started with fever 2 days ago, now with low grade fever and dry hacking cough. Doesn't feel like asthma is active. No tightness or sob. No sig GI sxs. Some fatigue. 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 She has mild intermittent asthma w/o recent exacerbations.   Assessment  1. COVID   2. Mild intermittent asthma without complication      Plan  covid:  high risk due to asthma but mild sxs now. Discussed pros and cons of paxlovid. Elect to prescribe. Treat cough with otc meds and Rob AC at night. Monitor breathing. Albuterol if needed. Out of work until completed 5 days of symptoms.   I discussed the assessment and treatment plan with the patient. The patient was provided an opportunity to ask questions and all were answered. The patient agreed with the plan and demonstrated an understanding of the instructions.   The patient was advised to call back or seek an in-person evaluation if the symptoms worsen or if the condition fails to  improve as anticipated. Follow up: prn  09/23/2022  Meds ordered this encounter  Medications   nirmatrelvir/ritonavir (PAXLOVID) 20 x 150 MG & 10 x 100MG  TABS    Sig: Take 3 tablets by mouth 2 (two) times daily for 5 days. (Take nirmatrelvir 150 mg two tablets twice daily for 5 days and ritonavir 100 mg one tablet twice daily for 5 days) Patient GFR is 82    Dispense:  30 tablet    Refill:  0   guaiFENesin-codeine 100-10 MG/5ML syrup    Sig: Take 5 mLs by mouth every 6 (six) hours as needed for cough.    Dispense:  120 mL    Refill:  0      I reviewed the patients updated PMH, FH, and SocHx.    Patient Active Problem List   Diagnosis Date Noted   GAD (generalized anxiety disorder) 12/27/2021   Panic attacks 12/27/2021   Depression, major, single episode, mild ([place]) 11/12/2021   Former smoker 10/06/2014   Attention deficit disorder (ADD) in adult 06/09/2014   Allergic rhinitis 02/02/2007   Asthma 02/02/2007   Current Meds  Medication Sig   albuterol (VENTOLIN HFA) 108 (90 Base) MCG/ACT inhaler INHALE 2 PUFFS BY MOUTH EVERY 6 HOURS AS NEEDED   guaiFENesin-codeine 100-10 MG/5ML syrup Take 5 mLs by mouth every 6 (six) hours as needed for cough.   hydrOXYzine (VISTARIL) 25 MG capsule Take 1 capsule (25 mg total) by mouth every 8 (eight) hours as needed  for anxiety. Dont drive for 8 hours after taking   iron polysaccharides (NU-IRON) 150 MG capsule Take 1 capsule (150 mg total) by mouth 2 (two) times daily. Take with food   Multiple Vitamin (MULTIVITAMIN) capsule Take 1 capsule by mouth daily.   nirmatrelvir/ritonavir (PAXLOVID) 20 x 150 MG & 10 x 100MG  TABS Take 3 tablets by mouth 2 (two) times daily for 5 days. (Take nirmatrelvir 150 mg two tablets twice daily for 5 days and ritonavir 100 mg one tablet twice daily for 5 days) Patient GFR is 82   pantoprazole (PROTONIX) 40 MG tablet Take 1 tablet (40 mg total) by mouth daily.   [DISCONTINUED] fluticasone-salmeterol (ADVAIR HFA) 115-21  MCG/ACT inhaler Inhale 2 puffs into the lungs 2 (two) times daily.    Allergies: Patient has No Known Allergies. Family History: Patient family history includes AAA (abdominal aortic aneurysm) in her mother; Alcohol abuse in her paternal grandmother; Arthritis in her mother; Colon polyps in her maternal grandmother; Diabetes in her half-brother; Drug abuse in her half-sister; Healthy in her half-brother and half-brother; Heart failure in her maternal grandmother; Other in her father, half-brother, and mother; Stomach cancer in her father. Social History:  Patient  reports that she quit smoking about 23 years ago. Her smoking use included cigarettes. She has a 7.50 pack-year smoking history. She has never used smokeless tobacco. She reports current alcohol use. She reports that she does not use drugs.  Review of Systems: Constitutional: Negative for fever malaise or anorexia Cardiovascular: negative for chest pain Respiratory: negative for SOB or persistent cough Gastrointestinal: negative for abdominal pain  OBJECTIVE Vitals: There were no vitals taken for this visit. General: no acute distress , A&Ox3 Hacking cough but no tachypnea or retractions. Appears well  Amber Arnt, MD

## 2022-08-08 ENCOUNTER — Ambulatory Visit (INDEPENDENT_AMBULATORY_CARE_PROVIDER_SITE_OTHER): Payer: BC Managed Care – PPO | Admitting: Gastroenterology

## 2022-08-08 DIAGNOSIS — D509 Iron deficiency anemia, unspecified: Secondary | ICD-10-CM

## 2022-08-08 NOTE — Care Plan (Signed)
CAPSULE ID: [id number]  Exp: 2023-12-06 [id number]  Patient arrived for capsule endoscopy. Reported the prep went well. Confirmed patient is fasting. Explained dietary restrictions for the next few hours. Patient verbalized understanding. Opened capsule, ensured capsule was flashing and transmitting to the recorder prior to the patient swallowing the capsule. Patient swallowed capsule without difficulty. Patient told to call the office with any questions. Understands to return to the office today between 4:00 and 4:30 pm. No further questions by the conclusion of the visit.

## 2022-08-08 NOTE — Patient Instructions (Signed)
POST CAPSULE INSTRUCTIONS:   Contact our office immediately at [phone] if you suffer from any abdominal pain, nausea, or vomiting during capsule endoscopy. Do not eat or drink for at least 2 hours. After 2 hours you may have any of the following to drink: Water                           White grape juice 7-Up                            Chicken Bouillon Sprite                           Ginger Ale  After 4 hours you may have a light snack to include any of the following: A cup of soup               sandwich Bowl of cereal             Rice Toast                           Eggs 2-3 small cookies (i.e. vanilla wafers or graham crackers)  Return to this office at 4:00 pm. After this you will be able to return to your normal diet.  During your procedure do not go near anyone else that is having capsule endoscopy.  Do not be in close contact with an MRI machine or a radio or television tower.   Do not wear a heavy coat or sweater because your recorder may over heat and stop recording.   Do not disconnect the equipment or remove the belt at any time.  Since the Data Recorder is actually a small computer, it should be treated with utmost care and protection.  Avoid sudden movement and banging of the Data Recorder.    Do not do any heavy lifting or strenuous physical activity during the test especially if it involves sweating.  Do not bend over or stoop during capsule endoscopy.  During capsule endoscopy, you will need to verify every 15 minutes that the small light on top of the Data Recorder is blinking twice per second.  If for some reason it stops blinking, record the time and contact our office at [phone].

## 2022-08-11 NOTE — Progress Notes (Signed)
Capsule endoscopy performed

## 2022-08-22 ENCOUNTER — Telehealth: Payer: Self-pay | Admitting: Gastroenterology

## 2022-08-22 NOTE — Telephone Encounter (Signed)
Capsule endoscopy 08/08/22:  Complete study, adequate prep - no concerning pathology. Negative study for cause of IDA.   EGD and colonoscopy without any high risk pathology.  Monitoring Hgb on iron. Capsule report to be uploaded into Epic.  Amber Elliott - negative capsule study for your patient. Let me know if questions.

## 2022-08-23 NOTE — Telephone Encounter (Signed)
The pt has been advised of the normal study and will await a call with further req's per Dr Orvan Falconer

## 2022-08-23 NOTE — Telephone Encounter (Signed)
PT is calling to discuss capsule EGD. Please advise.

## 2022-08-25 ENCOUNTER — Ambulatory Visit: Payer: BC Managed Care – PPO | Admitting: Gastroenterology

## 2022-08-26 ENCOUNTER — Encounter: Payer: Self-pay | Admitting: Gastroenterology

## 2022-08-26 ENCOUNTER — Telehealth: Payer: Self-pay

## 2022-08-26 DIAGNOSIS — Z1231 Encounter for screening mammogram for malignant neoplasm of breast: Secondary | ICD-10-CM | POA: Diagnosis not present

## 2022-08-26 DIAGNOSIS — D509 Iron deficiency anemia, unspecified: Secondary | ICD-10-CM

## 2022-08-26 NOTE — Telephone Encounter (Signed)
The patient has been notified of this information and all questions answered. She will come in for labs and expect a call from hematology.  She will let us know if she has not heard from that office in 1 week.

## 2022-08-26 NOTE — Telephone Encounter (Signed)
-----   Message from Tressia Danas, MD sent at 08/26/2022 12:59 PM EDT ----- Thank you for reviewing the normal results with the patient. Given the lack of identifying a cause of iron deficiency on EGD, colonoscopy, or capsule endoscopy, I recommend consultation with hematology. Please arrange for CBC and iron studies this month. Office follow-up at [facility] after the hematology appointment. She should continue iron supplements in the meantime. Please make the referral. Thank you. ----- Message ----- From: Lennie Odor Sent: 08/26/2022   9:04 AM EDT To: Tressia Danas, MD

## 2022-08-26 NOTE — Telephone Encounter (Signed)
Labs have been entered to be completed this month Referral to hematology has been made. Pt to continue iron supplements   Left message on machine to call back

## 2022-08-26 NOTE — Telephone Encounter (Signed)
Amber Danas, MD  Loretha Stapler, RN Thank you for reviewing the normal results with the patient. Given the lack of identifying a cause of iron deficiency on EGD, colonoscopy, or capsule endoscopy, I recommend consultation with hematology. Please arrange for CBC and iron studies this month. Office follow-up at [facility] after the hematology appointment. She should continue iron supplements in the meantime.  Please make the referral.  Thank you.

## 2022-08-29 ENCOUNTER — Other Ambulatory Visit (INDEPENDENT_AMBULATORY_CARE_PROVIDER_SITE_OTHER): Payer: BC Managed Care – PPO

## 2022-08-29 DIAGNOSIS — D509 Iron deficiency anemia, unspecified: Secondary | ICD-10-CM | POA: Diagnosis not present

## 2022-08-29 LAB — CBC WITH DIFFERENTIAL/PLATELET
Basophils Absolute: 0 10*3/uL (ref 0.0–0.1)
Basophils Relative: 0.7 % (ref 0.0–3.0)
Eosinophils Absolute: 0.4 10*3/uL (ref 0.0–0.7)
Eosinophils Relative: 7.2 % — ABNORMAL HIGH (ref 0.0–5.0)
HCT: 37.5 % (ref 36.0–46.0)
Hemoglobin: 12.5 g/dL (ref 12.0–15.0)
Lymphocytes Relative: 41.5 % (ref 12.0–46.0)
Lymphs Abs: 2.4 10*3/uL (ref 0.7–4.0)
MCHC: 33.4 g/dL (ref 30.0–36.0)
MCV: 82.9 fl (ref 78.0–100.0)
Monocytes Absolute: 0.4 10*3/uL (ref 0.1–1.0)
Monocytes Relative: 6.6 % (ref 3.0–12.0)
Neutro Abs: 2.5 10*3/uL (ref 1.4–7.7)
Neutrophils Relative %: 44 % (ref 43.0–77.0)
Platelets: 266 10*3/uL (ref 150.0–400.0)
RBC: 4.52 Mil/uL (ref 3.87–5.11)
RDW: 17.8 % — ABNORMAL HIGH (ref 11.5–15.5)
WBC: 5.7 10*3/uL (ref 4.0–10.5)

## 2022-08-30 LAB — IBC + FERRITIN
Ferritin: 6.3 ng/mL — ABNORMAL LOW (ref 10.0–291.0)
Iron: 76 ug/dL (ref 42–145)
Saturation Ratios: 17.9 % — ABNORMAL LOW (ref 20.0–50.0)
TIBC: 424.2 ug/dL (ref 250.0–450.0)
Transferrin: 303 mg/dL (ref 212.0–360.0)

## 2022-08-31 ENCOUNTER — Telehealth: Payer: Self-pay | Admitting: Gastroenterology

## 2022-08-31 NOTE — Telephone Encounter (Signed)
PT is returning call to discuss lab results. Please advise. 

## 2022-08-31 NOTE — Telephone Encounter (Signed)
error 

## 2022-08-31 NOTE — Telephone Encounter (Signed)
See 4/17 results note

## 2022-09-23 ENCOUNTER — Encounter: Payer: BC Managed Care – PPO | Admitting: Family Medicine

## 2022-09-29 ENCOUNTER — Other Ambulatory Visit: Payer: Self-pay

## 2022-09-29 ENCOUNTER — Inpatient Hospital Stay: Payer: BC Managed Care – PPO

## 2022-09-29 ENCOUNTER — Inpatient Hospital Stay: Payer: BC Managed Care – PPO | Attending: Hematology and Oncology | Admitting: Hematology and Oncology

## 2022-09-29 VITALS — BP 132/78 | HR 101 | Temp 98.1°F | Wt 183.1 lb

## 2022-09-29 DIAGNOSIS — D509 Iron deficiency anemia, unspecified: Secondary | ICD-10-CM | POA: Diagnosis not present

## 2022-09-29 NOTE — Progress Notes (Signed)
[facility] CONSULT NOTE  Patient Care Team: Shelva Majestic, MD as PCP - General (Family Medicine)  CHIEF COMPLAINTS/PURPOSE OF CONSULTATION:  Iron deficiency  HISTORY OF PRESENTING ILLNESS:  Amber Elliott 47 y.o. female is here because of recent diagnosis of iron deficiency.  She was noted to have anemia with a hemoglobin of 8 last year and she was prescribed oral iron and she is subsequently improved gradually with improvement in the hemoglobin.  She had seen gastroenterology but performed upper endoscopies colonoscopies and capsule endoscopies and did not find any source of bleeding.  She has since remained on iron replacement therapy but her ferritin continues to remain low and because of that she was referred to [place] for discussion regarding IV iron treatments.  Her major complaints are related to fatigue as well as some hair loss.  I reviewed her records extensively and collaborated the history with the patient.  MEDICAL HISTORY:  Past Medical History:  Diagnosis Date   ALLERGIC RHINITIS 02/02/2007   Allergy    Anemia    slight    ASTHMA 02/02/2007   Asthma    ATTENTION DEFICIT DISORDER, ADULT 05/14/2007   GERD 05/14/2007   symptoms resolved when became vegetarian   IDA (iron deficiency anemia)    PONV (postoperative nausea and vomiting)    Sessile serrated polyp of colon     SURGICAL HISTORY: Past Surgical History:  Procedure Laterality Date   BREAST REDUCTION SURGERY     age 19- 6 lbs of tissue   EYE SURGERY     esotropia   LEEP     97-no abnormal paps since   REDUCTION MAMMAPLASTY Bilateral    TONSILLECTOMY     1996 or 1997    SOCIAL HISTORY: Social History   Socioeconomic History   Marital status: Single    Spouse name: Not on file   Number of children: Not on file   Years of education: Not on file   Highest education level: Not on file  Occupational History   Not on file  Tobacco Use   Smoking status: Former    Packs/day: 0.75     Years: 10.00    Additional pack years: 0.00    Total pack years: 7.50    Types: Cigarettes    Quit date: 05/17/1999    Years since quitting: 23.3   Smokeless tobacco: Never  Vaping Use   Vaping Use: Never used  Substance and Sexual Activity   Alcohol use: Yes    Comment: socially   Drug use: No   Sexual activity: Yes    Birth control/protection: Condom  Other Topics Concern   Not on file  Social History Narrative   Single- lives alone but close to mom. No children.       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       Hobbies: reading, walks   Social Determinants of Health   Financial Resource Strain: Not on file  Food Insecurity: Not on file  Transportation Needs: Not on file  Physical Activity: Not on file  Stress: Not on file  Social Connections: Not on file  Intimate Partner Violence: Not on file    FAMILY HISTORY: Family History  Problem Relation Age of Onset  Arthritis Mother    AAA (abdominal aortic aneurysm) Mother        s/p repair in 88s   Other Mother        atherosclerosis   Stomach cancer Father        44s. led to his death at 75.    Other Father        menetrier disease- massive gastric folds   Diabetes Half-Brother    Other Half-Brother        unknown cause of death   Healthy Half-Brother    Healthy Half-Brother    Drug abuse Half-Sister        long term issues    Heart failure Maternal Grandmother        31 diagnosed with this   Colon polyps Maternal Grandmother    Alcohol abuse Paternal Grandmother    Colon cancer Neg Hx    Esophageal cancer Neg Hx    Rectal cancer Neg Hx     ALLERGIES:  has No Known Allergies.  MEDICATIONS:  Current Outpatient Medications  Medication Sig Dispense Refill   albuterol  (VENTOLIN HFA) 108 (90 Base) MCG/ACT inhaler INHALE 2 PUFFS BY MOUTH EVERY 6 HOURS AS NEEDED 8.5 g 1   guaiFENesin-codeine 100-10 MG/5ML syrup Take 5 mLs by mouth every 6 (six) hours as needed for cough. 120 mL 0   hydrOXYzine (VISTARIL) 25 MG capsule Take 1 capsule (25 mg total) by mouth every 8 (eight) hours as needed for anxiety. Dont drive for 8 hours after taking 90 capsule 3   iron polysaccharides (NU-IRON) 150 MG capsule Take 1 capsule (150 mg total) by mouth 2 (two) times daily. Take with food 60 capsule 6   Multiple Vitamin (MULTIVITAMIN) capsule Take 1 capsule by mouth daily.     pantoprazole (PROTONIX) 40 MG tablet Take 1 tablet (40 mg total) by mouth daily. 30 tablet 1   tiZANidine (ZANAFLEX) 4 MG tablet  (Patient not taking: Reported on 08/01/2022)     No current facility-administered medications for this visit.    REVIEW OF SYSTEMS:   Constitutional: Denies fevers, chills or abnormal night sweats   All other systems were reviewed with the patient and are negative.  PHYSICAL EXAMINATION: ECOG PERFORMANCE STATUS: 1 - Symptomatic but completely ambulatory  Vitals:   09/29/22 1541  BP: 132/78  Pulse: (!) 101  Temp: 98.1 F (36.7 C)  SpO2: 98%   Filed Weights   09/29/22 1541  Weight: 183 lb 1.6 oz (83.1 kg)    GENERAL:alert, no distress and comfortable    LABORATORY DATA:  I have reviewed the data as listed Lab Results  Component Value Date   WBC 5.7 08/29/2022   HGB 12.5 08/29/2022   HCT 37.5 08/29/2022   MCV 82.9 08/29/2022   PLT 266.0 08/29/2022   Lab Results  Component Value Date   NA 135 09/02/2021   K 3.8 09/02/2021   CL 103 09/02/2021   CO2 21 09/02/2021    RADIOGRAPHIC STUDIES: I have personally reviewed the radiological reports and agreed with the findings in the report.  ASSESSMENT AND PLAN:  IDA (iron deficiency anemia) Lab review: 08/29/2022: Hemoglobin 12.5, MCV 82.9, RDW 17.8, ferritin 6.3, iron saturation 17.9%  Iron deficiency  without any significant anemia Iron deficiency anemia: I discussed with the patient the process of iron absorption. I counseled extensively regarding the different causes of iron deficiency including blood loss and malabsorption. Patient has had upper endoscopies and colonoscopies and did not have any  clear identified source of blood loss. Therefore we suspect malabsorption as a most likely cause.  Even though with oral iron she did not improve the hemoglobin, the ferritin stayed low suggesting that she is not absorbing adequately.  Patient is symptomatic with fatigue issues.  Therefore we will attempt to give her IV iron and see if that makes a difference.  If that does not improve her fatigue then there is no point in adding additional IV iron in the future. Goal of iron infusion is to keep the ferritin above 10 and iron saturation greater than 10%.  Recommendation: Proceed with IV iron infusions  I discussed with the patient that potentially there may be a need for additional IV iron infusions if the iron levels were to remain low in the future. The frequency of need of IV iron would depend on many other factors including the rate of loss and by the degree of absorption.  Return to clinic in 3 months with recheck on iron studies and hemoglobin.   All questions were answered. The patient knows to call the clinic with any problems, questions or concerns.    Tamsen Meek, MD 09/29/22

## 2022-09-29 NOTE — Assessment & Plan Note (Signed)
Lab review: 08/29/2022: Hemoglobin 12.5, MCV 82.9, RDW 17.8, ferritin 6.3, iron saturation 17.9%  Iron deficiency without any significant anemia Iron deficiency anemia: I discussed with the patient the process of iron absorption. I counseled extensively regarding the different causes of iron deficiency including blood loss and malabsorption. Patient has had upper endoscopies and colonoscopies and did not have any clear identified source of blood loss. It is possible that the patient may still have an occult source of bleeding. However malabsorption is also a possibility.   Recommendation: Proceed with IV iron infusions  I discussed with the patient that potentially there may be a need for additional IV iron infusions if the iron levels were to remain low in the future. The frequency of need of IV iron would depend on many other factors including the rate of loss and by the degree of absorption.  Return to clinic in 4 months with recheck on iron studies and hemoglobin.

## 2022-10-07 ENCOUNTER — Other Ambulatory Visit: Payer: Self-pay

## 2022-10-07 ENCOUNTER — Inpatient Hospital Stay: Payer: BC Managed Care – PPO

## 2022-10-07 VITALS — BP 123/79 | HR 77 | Temp 98.3°F | Resp 17

## 2022-10-07 DIAGNOSIS — D509 Iron deficiency anemia, unspecified: Secondary | ICD-10-CM

## 2022-10-07 MED ORDER — SODIUM CHLORIDE 0.9 % IV SOLN: Freq: Once | INTRAVENOUS | Status: AC

## 2022-10-07 MED ORDER — SODIUM CHLORIDE 0.9 % IV SOLN
300.0000 mg | Freq: Once | INTRAVENOUS | Status: AC
  Administered 2022-10-07: 300 mg via INTRAVENOUS
  Filled 2022-10-07: qty 300

## 2022-10-07 NOTE — Patient Instructions (Signed)

## 2022-10-22 ENCOUNTER — Inpatient Hospital Stay: Payer: BC Managed Care – PPO | Attending: Hematology and Oncology

## 2022-10-22 VITALS — BP 119/77 | HR 75 | Temp 97.7°F | Resp 18

## 2022-10-22 DIAGNOSIS — D509 Iron deficiency anemia, unspecified: Secondary | ICD-10-CM | POA: Diagnosis not present

## 2022-10-22 MED ORDER — SODIUM CHLORIDE 0.9 % IV SOLN: Freq: Once | INTRAVENOUS | Status: AC

## 2022-10-22 MED ORDER — SODIUM CHLORIDE 0.9 % IV SOLN
300.0000 mg | Freq: Once | INTRAVENOUS | Status: AC
  Administered 2022-10-22: 300 mg via INTRAVENOUS
  Filled 2022-10-22: qty 300

## 2022-10-22 NOTE — Progress Notes (Signed)
Tolerated infusion well, VSS, discharged alert and ambulatory.

## 2022-10-22 NOTE — Patient Instructions (Signed)

## 2022-10-27 ENCOUNTER — Telehealth: Payer: Self-pay | Admitting: *Deleted

## 2022-10-27 ENCOUNTER — Telehealth: Payer: Self-pay | Admitting: Hematology and Oncology

## 2022-10-27 NOTE — Telephone Encounter (Signed)
Spoke with patient confirming upcoming appointment change  

## 2022-10-27 NOTE — Telephone Encounter (Signed)
Received call from pt stating this morning her BP was 86/67 and she felt light headed.  Pt states she went for a 2 mile walk and her BP is now 99/78 and asymptomatic.  Pt calling to request advice from MD if IV iron infusion should be pushed out by 1 week due to hypotension.  Per provider, pt needing to increase p.o fluid intake and push out infusion by 1 week.  If symptoms continue pt will f/u with PCP.  Scheduling message sent, pt verbalized understanding.

## 2022-10-28 ENCOUNTER — Encounter: Payer: Self-pay | Admitting: Family Medicine

## 2022-10-28 ENCOUNTER — Ambulatory Visit (INDEPENDENT_AMBULATORY_CARE_PROVIDER_SITE_OTHER): Payer: BC Managed Care – PPO | Admitting: Family Medicine

## 2022-10-28 VITALS — BP 124/78 | HR 78 | Temp 98.0°F | Ht 61.0 in | Wt 178.8 lb

## 2022-10-28 DIAGNOSIS — J4520 Mild intermittent asthma, uncomplicated: Secondary | ICD-10-CM

## 2022-10-28 DIAGNOSIS — Z131 Encounter for screening for diabetes mellitus: Secondary | ICD-10-CM

## 2022-10-28 DIAGNOSIS — F325 Major depressive disorder, single episode, in full remission: Secondary | ICD-10-CM

## 2022-10-28 DIAGNOSIS — F411 Generalized anxiety disorder: Secondary | ICD-10-CM

## 2022-10-28 DIAGNOSIS — Z87891 Personal history of nicotine dependence: Secondary | ICD-10-CM | POA: Diagnosis not present

## 2022-10-28 DIAGNOSIS — E785 Hyperlipidemia, unspecified: Secondary | ICD-10-CM

## 2022-10-28 DIAGNOSIS — E669 Obesity, unspecified: Secondary | ICD-10-CM

## 2022-10-28 DIAGNOSIS — Z0000 Encounter for general adult medical examination without abnormal findings: Secondary | ICD-10-CM | POA: Diagnosis not present

## 2022-10-28 DIAGNOSIS — D509 Iron deficiency anemia, unspecified: Secondary | ICD-10-CM

## 2022-10-28 LAB — URINALYSIS, ROUTINE W REFLEX MICROSCOPIC
Bilirubin Urine: NEGATIVE
Hgb urine dipstick: NEGATIVE
Ketones, ur: NEGATIVE
Leukocytes,Ua: NEGATIVE
Nitrite: NEGATIVE
RBC / HPF: NONE SEEN (ref 0–?)
Specific Gravity, Urine: <=1.005 — AB (ref 1.000–1.030)
Total Protein, Urine: NEGATIVE
Urine Glucose: NEGATIVE
Urobilinogen, UA: 0.2 (ref 0.0–1.0)
WBC, UA: NONE SEEN (ref 0–?)
pH: 6 (ref 5.0–8.0)

## 2022-10-28 NOTE — Progress Notes (Signed)
Phone [phone]   Subjective:  Patient presents today for their annual physical. Chief complaint-noted.   See problem oriented charting- ROS- full  review of systems was completed and negative Per full ROS sheet completed by patient  The following were reviewed and entered/updated in epic: Past Medical History:  Diagnosis Date   ALLERGIC RHINITIS 02/02/2007   Allergy    Anemia    slight    Anxiety 08/2021   ASTHMA 02/02/2007   Asthma    ATTENTION DEFICIT DISORDER, ADULT 05/14/2007   Depression 08/2021   GERD 05/14/2007   symptoms resolved when became vegetarian   IDA (iron deficiency anemia)    PONV (postoperative nausea and vomiting)    Sessile serrated polyp of colon    Patient Active Problem List   Diagnosis Date Noted   GAD (generalized anxiety disorder) 12/27/2021    Priority: Medium    Panic attacks 12/27/2021    Priority: Medium    Major depression in full remission (HCC) 11/12/2021    Priority: Medium    Attention deficit disorder (ADD) in adult 06/09/2014    Priority: Medium    Asthma 02/02/2007    Priority: Medium    Former smoker 10/06/2014    Priority: Low   Allergic rhinitis 02/02/2007    Priority: Low   IDA (iron deficiency anemia) 09/29/2022   Past Surgical History:  Procedure Laterality Date   BREAST REDUCTION SURGERY     age 50- 6 lbs of tissue   EYE SURGERY     esotropia   LEEP     89-no abnormal paps since   REDUCTION MAMMAPLASTY Bilateral    TONSILLECTOMY     1996 or 1997    Family History  Problem Relation Age of Onset   Arthritis Mother    AAA (abdominal aortic aneurysm) Mother        s/p repair in 40s   Other Mother        atherosclerosis   Hearing loss Mother    Stomach cancer Father        63s. led to his death at [place].    Other Father        menetrier disease- massive gastric folds   Alcohol abuse Father    Cancer Father    Diabetes Half-Brother    Other Half-Brother        unknown cause of death   Healthy  Half-Brother    Healthy Half-Brother    Drug abuse Half-Sister        long term issues    Heart failure Maternal Grandmother        2 diagnosed with this   Colon polyps Maternal Grandmother    Alcohol abuse Paternal Grandmother    Colon cancer Neg Hx    Esophageal cancer Neg Hx    Rectal cancer Neg Hx     Medications- reviewed and updated Current Outpatient Medications  Medication Sig Dispense Refill   albuterol (VENTOLIN HFA) 108 (90 Base) MCG/ACT inhaler INHALE 2 PUFFS BY MOUTH EVERY 6 HOURS AS NEEDED 8.5 g 1   hydrOXYzine (VISTARIL) 25 MG capsule Take 1 capsule (25 mg total) by mouth every 8 (eight) hours as needed for anxiety. Dont drive for 8 hours after taking 90 capsule 3   Multiple Vitamin (MULTIVITAMIN) capsule Take 1 capsule by mouth daily.     tiZANidine (ZANAFLEX) 4 MG tablet      No current facility-administered medications for this visit.    Allergies-reviewed and updated No Known Allergies  Social History   Social History Narrative   Single- lives alone but close to mom. No children.       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       Hobbies: reading, walks   Objective  Objective:  BP 124/78   Pulse 78   Temp 98 F (36.7 C)   Ht 5\' 1"  (1.549 m)   Wt 178 lb 12.8 oz (81.1 kg)   SpO2 96%   BMI 33.78 kg/m  Gen: NAD, resting comfortably HEENT: Mucous membranes are moist. Oropharynx normal Neck: no thyromegaly CV: RRR no murmurs rubs or gallops Lungs: CTAB no crackles, wheeze, rhonchi Abdomen: soft/nontender/nondistended/normal bowel sounds. No rebound or guarding.  Ext: no edema Skin: warm, dry Neuro: grossly normal, moves all extremities, PERRLA   Assessment and Plan   47 y.o. Elliott presenting for annual  physical.  Health Maintenance counseling: 1. Anticipatory guidance: Patient counseled regarding regular dental exams - advised q6 months, eye exams - yearly,  avoiding smoking and second hand smoke , limiting alcohol to 1 beverage per day-rare social wine- feels hung over nowadays so mainly avoids, no illicit drugs.   2. Risk factor reduction:  Advised patient of need for regular exercise and diet rich and fruits and vegetables to reduce risk of heart attack and stroke.  Exercise- walking 2 miles a day most days - now that energy is better.  Diet/weight management-weight peaked around 187 but trending back down. Was 164 last Comprehensive Physical Exam (CPE) preventive care annual visit and 150's in 2022- reports fatigue was a big factor in weight gain as well as cravings/binging- much better after infusions.  Wt Readings from Last 3 Encounters:  10/28/22 178 lb 12.8 oz (81.1 kg)  09/29/22 183 lb 1.6 oz (83.1 kg)  06/27/22 187 lb (84.8 kg)  3. Immunizations/screenings/ancillary studies- up to date , opts out of COVID vaccine for now  had COVID in march 2024  but did ok - considering in the fall Immunization History  Administered Date(s) Administered   Influenza Split 02/17/2012   Influenza Whole 03/12/2007, 02/14/2008   Influenza,inj,Quad PF,6+ Mos 05/13/2013, 05/05/2014, 02/27/2015   Influenza-Unspecified 02/28/2020   PFIZER(Purple Top)SARS-COV-2 Vaccination 06/21/2019, 07/12/2019, 02/07/2020   Pneumococcal Polysaccharide-23 02/06/2008   Td 06/04/2010   Tdap 05/16/2009, 09/02/2021   Unspecified SARS-COV-2 Vaccination 06/21/2019, 07/12/2019, 02/07/2020, 10/20/2020, 03/01/2021   Varicella 12/21/2012, 01/23/2013   Varicella Zoster Immune Globulin 05/16/2012  4. Cervical cancer screening- pap smear 12/12/19 with 3-5 year repeat planned- HPV was negative . Scheduled in August for next visit 5. Breast cancer screening-  breast exam with GYN and mammogram 04/11/22 with 1 year repeat planned 6.  Colon cancer screening - 06/27/22  with 5 year repeat planned 7. Skin cancer screening- no dermatology visit but planning to call to schedule. advised regular sunscreen use. Denies worrisome, changing, or new skin lesions.  8. Birth control/STD check- still not dating.  Has never had unprotected sex and then not been tested. does not need STD screening/declines- premenopausal still  9. Osteoporosis screening at 61- will plan on this- former smoker slightly increases risk  -former smoker quit 2001- 7.5 pack years- no regular screening required other than will get urinalysis   Status of chronic or acute concerns   #  Depression and history anxiety and panic attacks S: Medication:hydroxyzine at night or benadryl- sleep still not ideal -prior severe situational stress related to work related to prior school- resolved at new school. Prior Wellbutrin in 2023 A/P: full remission- continue current medications - mainly was situational - still some sleep issues but wonders if related to adjusting to improved hemoglobin, improving exercise- wants to monitor  # ADD S:medication(s): none -prior Vyvanse in past but was causing insomnia. Mild improvement on wellbutrin  A/P: reasonable control- continue to monitor    # iron deficiency anemia - required iron infisions may and June 2021 with Dr. Pamelia Hoit S:medication(s): none, prior birth control to regulate cycles and on iron supplement -extensive gastroenterology workup in 2023-2024 include colonoscopy, EGD 9some gastroesophageal reflux disease) and capsule endoscopy- with Dr. Orvan Falconer -still will Colonoscopy every 5 years  A/P: hemoglobin improving but ferritin lags behind- stil lhas another infusion upcomign- already had extensive excellent workup- trigger unclear    #asthma- well controlled on sparing albuterol - occasional with allergies  #hyperlipidemia S: Medication:none  Lab Results  Component Value Date   CHOL 171 09/02/2021   HDL 73.[place] 09/02/2021    LDLCALC 85 09/02/2021   TRIG 60.0 09/02/2021   CHOLHDL 2 09/02/2021   A/P: lipids very very slight elevation above ideal- we will update with labs The 10-year ASCVD risk score (Arnett DK, et al., 2019) is: 0.5%  Recommended follow up: Return in about 1 year (around 10/28/2023) for physical or sooner if needed.Schedule b4 you leave. Future Appointments  Date Time Provider Department Center  11/02/2022  2:00 PM Wyline Beady A, NP [facility] None  11/05/2022 10:30 AM [facility] INFUSION [facility] None  01/09/2023  2:00 PM Wyline Beady A, NP [facility] None  01/30/2023  4:00 PM [facility] LAB [facility] None  02/01/2023  8:15 AM Serena Croissant, MD [facility] None   Lab/Order associations:NOT fasting   ICD-10-CM   1. Routine general medical examination at a health care facility  Z00.00 HgB A1c    2. Mild intermittent asthma without complication  J45.20     3. Major depressive disorder with single episode, in full remission (HCC)  F32.5     4. GAD (generalized anxiety disorder)  F41.1     5. Iron deficiency anemia, unspecified iron deficiency anemia type  D50.9 CBC with Differential/Platelet    IBC + Ferritin    6. Former smoker  Z87.891 Urinalysis, Routine w reflex microscopic    7. Mild hyperlipidemia  E78.5 Comprehensive metabolic panel    CBC with Differential/Platelet    Lipid panel    Urinalysis, Routine w reflex microscopic    8. Screening for diabetes mellitus  Z13.1 HgB A1c    9. Obesity (BMI 30-39.9)  E66.9 HgB A1c     No orders of the defined types were placed in this encounter.  Return precautions advised.  Tana Conch, MD

## 2022-10-28 NOTE — Patient Instructions (Addendum)
Let us know if you get any COVID vaccines this fall.  Consider dental exam and dermatology visits as things settle  Please stop by lab before you go If you have mychart- we will send your results within 3 business days of [place] receiving them.  If you do not have mychart- we will call you about results within 5 business days of [place] receiving them.  *please also note that you will see labs on mychart as soon as they post. I will later go in and write notes on them- will say "notes from Dr. Durene Cal"   Recommended follow up: Return in about 1 year (around 10/28/2023) for physical or sooner if needed.Schedule b4 you leave.

## 2022-10-29 ENCOUNTER — Inpatient Hospital Stay: Payer: BC Managed Care – PPO

## 2022-10-31 ENCOUNTER — Other Ambulatory Visit: Payer: BC Managed Care – PPO

## 2022-10-31 ENCOUNTER — Encounter: Payer: Self-pay | Admitting: Family Medicine

## 2022-10-31 DIAGNOSIS — D509 Iron deficiency anemia, unspecified: Secondary | ICD-10-CM

## 2022-10-31 DIAGNOSIS — Z131 Encounter for screening for diabetes mellitus: Secondary | ICD-10-CM

## 2022-10-31 DIAGNOSIS — E785 Hyperlipidemia, unspecified: Secondary | ICD-10-CM

## 2022-10-31 DIAGNOSIS — Z0000 Encounter for general adult medical examination without abnormal findings: Secondary | ICD-10-CM

## 2022-10-31 DIAGNOSIS — D649 Anemia, unspecified: Secondary | ICD-10-CM

## 2022-11-01 NOTE — Telephone Encounter (Signed)
Please schedule OV for pt. 

## 2022-11-01 NOTE — Telephone Encounter (Signed)
See below

## 2022-11-01 NOTE — Telephone Encounter (Signed)
Next available OV with PCP isn't until 7/24. Can patient worked or should she be worked in sooner? Please Advise.

## 2022-11-02 ENCOUNTER — Ambulatory Visit: Payer: BC Managed Care – PPO | Admitting: Nurse Practitioner

## 2022-11-02 ENCOUNTER — Encounter: Payer: Self-pay | Admitting: Nurse Practitioner

## 2022-11-02 VITALS — BP 110/72 | HR 76

## 2022-11-02 DIAGNOSIS — N951 Menopausal and female climacteric states: Secondary | ICD-10-CM | POA: Diagnosis not present

## 2022-11-02 DIAGNOSIS — Z79890 Hormone replacement therapy (postmenopausal): Secondary | ICD-10-CM

## 2022-11-02 DIAGNOSIS — D509 Iron deficiency anemia, unspecified: Secondary | ICD-10-CM

## 2022-11-02 MED ORDER — ESTRADIOL 0.0375 MG/24HR TD PTWK
0.0375 mg | MEDICATED_PATCH | TRANSDERMAL | 0 refills | Status: DC
Start: 2022-11-02 — End: 2023-03-03

## 2022-11-02 MED ORDER — PROGESTERONE MICRONIZED 100 MG PO CAPS
100.0000 mg | ORAL_CAPSULE | Freq: Every day | ORAL | 0 refills | Status: DC
Start: 2022-11-02 — End: 2023-03-03

## 2022-11-02 NOTE — Progress Notes (Signed)
   Acute Office Visit  Subjective:    Patient ID: [place] Elliott, female    DOB: [birth year], 47 y.o.   MRN: [id number]   HPI 47 y.o. G1P0010 presents today to discuss anemia. Low ferritin of 6.3 in April. Received iron transfusions 5/24 and 6/8. Has not been able to check labs due to inability to collect (had panic attack during lab draw 6/14). Negative GI workup - colonoscopy, endoscopy and capsule endoscopy. Menses are irregular. Monthly cycles until December, then had cycle in March and has not had one since. Normal bleeding pattern with menses in March. Reports having increased anxiety, had 3 panic attacks this weekend. She does not have any situational stressors and feels it is very random. PRN hydroxyzine provided by PCP. Having some hot flashes.   Patient's last menstrual period was 08/07/2022. Period Pattern: (!) Irregular  Review of Systems  Constitutional: Negative.   Endocrine: Positive for heat intolerance.  Genitourinary:  Positive for menstrual problem (Irregular).       Objective:    Physical Exam Constitutional:      Appearance: Normal appearance.   GU: not indicated  BP 110/72 (BP Location: Right Arm, Patient Position: Sitting, Cuff Size: Normal)   Pulse 76   LMP 08/07/2022   SpO2 97%  Wt Readings from Last 3 Encounters:  10/28/22 178 lb 12.8 oz (81.1 kg)  09/29/22 183 lb 1.6 oz (83.1 kg)  06/27/22 187 lb (84.8 kg)        Assessment & Plan:   Problem List Items Addressed This Visit       Other   IDA (iron deficiency anemia)   Other Visit Diagnoses     Perimenopause    -  Primary   Relevant Medications   progesterone (PROMETRIUM) 100 MG capsule   estradiol (CLIMARA) 0.0375 mg/24hr patch   Hormone replacement therapy       Relevant Medications   progesterone (PROMETRIUM) 100 MG capsule   estradiol (CLIMARA) 0.0375 mg/24hr patch      Plan: Has had 1 menstrual cycle in 6 months and bleeding pattern was normal. Anemia likely not from uterine  bleeding. Discussed perimenopausal symptoms and worsening anxiety and panic attacks. Management with SSRI and/or HRT discussed. She would like to try HRT. Educated on proper use, risks and benefits. Will follow up at annual in August. All questions answered.      [name] DNP, 2:23 PM 11/02/2022

## 2022-11-04 MED FILL — Iron Sucrose Inj 20 MG/ML (Fe Equiv): INTRAVENOUS | Qty: 15 | Status: AC

## 2022-11-05 ENCOUNTER — Inpatient Hospital Stay: Payer: BC Managed Care – PPO

## 2022-11-08 DIAGNOSIS — F410 Panic disorder [episodic paroxysmal anxiety] without agoraphobia: Secondary | ICD-10-CM | POA: Diagnosis not present

## 2022-11-08 DIAGNOSIS — F331 Major depressive disorder, recurrent, moderate: Secondary | ICD-10-CM | POA: Diagnosis not present

## 2022-11-11 DIAGNOSIS — F331 Major depressive disorder, recurrent, moderate: Secondary | ICD-10-CM | POA: Diagnosis not present

## 2022-11-11 DIAGNOSIS — F410 Panic disorder [episodic paroxysmal anxiety] without agoraphobia: Secondary | ICD-10-CM | POA: Diagnosis not present

## 2022-11-14 ENCOUNTER — Other Ambulatory Visit (INDEPENDENT_AMBULATORY_CARE_PROVIDER_SITE_OTHER): Payer: BC Managed Care – PPO

## 2022-11-14 ENCOUNTER — Ambulatory Visit (INDEPENDENT_AMBULATORY_CARE_PROVIDER_SITE_OTHER): Payer: BC Managed Care – PPO | Admitting: Family Medicine

## 2022-11-14 VITALS — BP 110/80 | HR 86 | Temp 97.2°F | Ht 61.0 in | Wt 183.2 lb

## 2022-11-14 DIAGNOSIS — Z0000 Encounter for general adult medical examination without abnormal findings: Secondary | ICD-10-CM | POA: Diagnosis not present

## 2022-11-14 DIAGNOSIS — F410 Panic disorder [episodic paroxysmal anxiety] without agoraphobia: Secondary | ICD-10-CM | POA: Diagnosis not present

## 2022-11-14 DIAGNOSIS — E785 Hyperlipidemia, unspecified: Secondary | ICD-10-CM | POA: Diagnosis not present

## 2022-11-14 DIAGNOSIS — F325 Major depressive disorder, single episode, in full remission: Secondary | ICD-10-CM | POA: Diagnosis not present

## 2022-11-14 DIAGNOSIS — D509 Iron deficiency anemia, unspecified: Secondary | ICD-10-CM

## 2022-11-14 DIAGNOSIS — Z131 Encounter for screening for diabetes mellitus: Secondary | ICD-10-CM

## 2022-11-14 DIAGNOSIS — F411 Generalized anxiety disorder: Secondary | ICD-10-CM

## 2022-11-14 DIAGNOSIS — F331 Major depressive disorder, recurrent, moderate: Secondary | ICD-10-CM | POA: Diagnosis not present

## 2022-11-14 DIAGNOSIS — D649 Anemia, unspecified: Secondary | ICD-10-CM | POA: Diagnosis not present

## 2022-11-14 LAB — CBC WITH DIFFERENTIAL/PLATELET
Basophils Absolute: 0 10*3/uL (ref 0.0–0.1)
Basophils Relative: 0.6 % (ref 0.0–3.0)
Eosinophils Absolute: 0.6 10*3/uL (ref 0.0–0.7)
Eosinophils Relative: 9.7 % — ABNORMAL HIGH (ref 0.0–5.0)
HCT: 43 % (ref 36.0–46.0)
Hemoglobin: 14.3 g/dL (ref 12.0–15.0)
Lymphocytes Relative: 36.8 % (ref 12.0–46.0)
Lymphs Abs: 2.3 10*3/uL (ref 0.7–4.0)
MCHC: 33.1 g/dL (ref 30.0–36.0)
MCV: 86.9 fl (ref 78.0–100.0)
Monocytes Absolute: 0.3 10*3/uL (ref 0.1–1.0)
Monocytes Relative: 5.4 % (ref 3.0–12.0)
Neutro Abs: 3 10*3/uL (ref 1.4–7.7)
Neutrophils Relative %: 47.5 % (ref 43.0–77.0)
Platelets: 279 10*3/uL (ref 150.0–400.0)
RBC: 4.95 Mil/uL (ref 3.87–5.11)
RDW: 15.3 % (ref 11.5–15.5)
WBC: 6.4 10*3/uL (ref 4.0–10.5)

## 2022-11-14 LAB — COMPREHENSIVE METABOLIC PANEL
ALT: 20 U/L (ref 0–35)
AST: 20 U/L (ref 0–37)
Albumin: 4.2 g/dL (ref 3.5–5.2)
Alkaline Phosphatase: 71 U/L (ref 39–117)
BUN: 9 mg/dL (ref 6–23)
CO2: 26 mEq/L (ref 19–32)
Calcium: 9.6 mg/dL (ref 8.4–10.5)
Chloride: 105 mEq/L (ref 96–112)
Creatinine, Ser: 0.78 mg/dL (ref 0.40–1.20)
GFR: 89.82 mL/min (ref >60.00)
Glucose, Bld: 102 mg/dL — ABNORMAL HIGH (ref 70–99)
Potassium: 4 mEq/L (ref 3.5–5.1)
Sodium: 139 mEq/L (ref 135–145)
Total Bilirubin: 0.5 mg/dL (ref 0.2–1.2)
Total Protein: 7.1 g/dL (ref 6.0–8.3)

## 2022-11-14 LAB — IBC + FERRITIN
Ferritin: 60.1 ng/mL (ref 10.0–291.0)
Iron: 115 ug/dL (ref 42–145)
Saturation Ratios: 35.3 % (ref 20.0–50.0)
TIBC: 326.2 ug/dL (ref 250.0–450.0)
Transferrin: 233 mg/dL (ref 212.0–360.0)

## 2022-11-14 LAB — HEMOGLOBIN A1C: Hgb A1c MFr Bld: 5.5 % (ref 4.6–6.5)

## 2022-11-14 LAB — LIPID PANEL
Cholesterol: 186 mg/dL (ref 0–200)
HDL: 63.8 mg/dL (ref >39.00)
LDL Cholesterol: 108 mg/dL — ABNORMAL HIGH (ref 0–99)
NonHDL: 122.24
Total CHOL/HDL Ratio: 3
Triglycerides: 71 mg/dL (ref 0.0–149.0)
VLDL: 14.2 mg/dL (ref 0.0–40.0)

## 2022-11-14 MED ORDER — FLUOXETINE HCL 10 MG PO CAPS
10.0000 mg | ORAL_CAPSULE | Freq: Every day | ORAL | 5 refills | Status: DC
Start: 2022-11-14 — End: 2023-07-06

## 2022-11-14 MED ORDER — HYDROXYZINE HCL 50 MG PO TABS: 50.0000 mg | ORAL_TABLET | Freq: Three times a day (TID) | ORAL | 2 refills | Status: DC | PRN

## 2022-11-14 MED ORDER — LORAZEPAM 0.5 MG PO TABS: 0.5000 mg | ORAL_TABLET | Freq: Two times a day (BID) | ORAL | 1 refills | Status: DC | PRN

## 2022-11-14 NOTE — Patient Instructions (Addendum)
Taking the medicine  (Prozac/fluoxetine) as directed and not missing any doses is one of the best things you can do to treat your anxiety and panic attacks.  Here are some things to keep in mind:  Side effects (stomach upset, some increased anxiety) may happen before you notice a benefit.  These side effects typically go away over time. Changes to your dose of medicine or a change in medication all together is sometimes necessary Most people need to be on medication at least 6-12 months Many people will notice an improvement within two weeks but the full effect of the medication can take up to 6 weeks Stopping the medication when you start feeling better often results in a return of symptoms If you start having thoughts of hurting yourself or others after starting this medicine, call our office immediately at [phone] or seek care through 911 or [phone].   Start with hydroxyzine- increased dose to 50 mg since you do not feel sedated- if needed and if not effective could try ativan/lorazepam  Recommended follow up: Return in about 6 weeks (around 12/26/2022) for followup or sooner if needed.Schedule b4 you leave.

## 2022-11-14 NOTE — Progress Notes (Signed)
Phone [phone] In person visit   Subjective:   [place] Elliott is a 47 y.o. year old very pleasant female patient who presents for/with See problem oriented charting Chief Complaint  Patient presents with   Anxiety    Pt is here to f/u on mychart message regarding anxiety meds    Past Medical History-  Patient Active Problem List   Diagnosis Date Noted   IDA (iron deficiency anemia) 09/29/2022    Priority: Medium    GAD (generalized anxiety disorder) 12/27/2021    Priority: Medium    Panic attacks 12/27/2021    Priority: Medium    Major depression in full remission (HCC) 11/12/2021    Priority: Medium    Attention deficit disorder (ADD) in adult 06/09/2014    Priority: Medium    Asthma 02/02/2007    Priority: Medium    Former smoker 10/06/2014    Priority: Low   Allergic rhinitis 02/02/2007    Priority: Low    Medications- reviewed and updated Current Outpatient Medications  Medication Sig Dispense Refill   albuterol (VENTOLIN HFA) 108 (90 Base) MCG/ACT inhaler INHALE 2 PUFFS BY MOUTH EVERY 6 HOURS AS NEEDED 8.5 g 1   estradiol (CLIMARA) 0.0375 mg/24hr patch Place 1 patch (0.0375 mg total) onto the skin once a week. 12 patch 0   FLUoxetine (PROZAC) 10 MG capsule Take 1 capsule (10 mg total) by mouth daily. 30 capsule 5   hydrOXYzine (ATARAX) 50 MG tablet Take 1 tablet (50 mg total) by mouth 3 (three) times daily as needed. 90 tablet 2   LORazepam (ATIVAN) 0.5 MG tablet Take 1 tablet (0.5 mg total) by mouth 2 (two) times daily as needed for anxiety (do not take for 8 hours after taking. backup if hydroxyzine not effective.). 15 tablet 1   Multiple Vitamin (MULTIVITAMIN) capsule Take 1 capsule by mouth daily.     progesterone (PROMETRIUM) 100 MG capsule Take 1 capsule (100 mg total) by mouth daily. 90 capsule 0   tiZANidine (ZANAFLEX) 4 MG tablet      No current facility-administered medications for this visit.     Objective:  BP 110/80   Pulse 86   Temp (!)  97.2 F (36.2 C)   Ht 5\' 1"  (1.549 m)   Wt 183 lb 3.2 oz (83.1 kg)   LMP 08/07/2022   SpO2 96%   BMI 34.62 kg/m  Gen: NAD, resting comfortably     Assessment and Plan   # Recent worsening of anxiety and panic attacks # History of depression but recently controlled S:  Prior history  medication:hydroxyzine at night in the past -prior severe situational stress related to work related to prior school- resolved at new school. Prior Wellbutrin in 2023 -Patient was doing very well at physical on 10/28/2022 but interestingly enough shortly after that she began to have significant anxiety after a blood draw attempt.  Initially we were going to attempt to medicate just before blood draw but she later admitted she was having increased anxiety and panic attacks on a daily basis and with worsening we recommended closer follow-up - Patient saw GYN and they were going to attempt hormone replacement therapy to see if this would be helpful. - On June 21 she reported panic attacks were to every other day at that point.  She had an upcoming visit with her therapist - Patient asked about trying hydroxyzine 50 mg and agreed she could do so but would be more sedated  Today she reports -  no major difference on hormone replacement therapy  -PHQ9 of 7 but issues are driven by anxiety and GAD7 of 19 rated as somewhat difficulty. - does feel good with 2 hydroxyzine- much more relieved and doesn't feel tired  -thinks was on Zoloft and luvox in college short term -No suicidal ideation  - she started Eye Movement Desensitization and Reprocessing (EMDR) recently- did have first session before these attacks started. Working with Dub Amis online- twice a week which seems to have brought up prior issues which she is having to process/work through -Remains off ADD medicines  A/P: Depression appears controlled/full remission without medicine but anxiety and panic attacks have significantly worsened-seems to have  started after starting processing of her situation through EMDR therapy -In the long run I am hopeful EMDR therapy will make a significant impact on her overall anxiety and panic attack pattern -But recently having to do use hydroxyzine 50 mg 3 times a day -Strongly suspect she needs SSRI-as we reviewed side effect profiles she was concerned about weight gain and wanted to trial Prozac even though may have slightly worse agitation/insomnia profile than Lexapro -For very sparing use also prescribed Ativan No. 15 tablets in case she has a more significant panic attack such as the one that made her call 911    # iron deficiency anemia  - required iron infisions may and June 2021 with Dr. Pamelia Hoit and again had 2 infusions in May and June of this year-significant improvement in anemia but she would like to hold off on the third infusion until her upcoming visit with oncology especially with recent anxiety with medical procedures  # Hyperlipidemia-has worsened but patient reports not eating well with recent anxiety-she anticipates improving this substantially when things settle  Recommended follow up: Return in about 6 weeks (around 12/26/2022) for followup or sooner if needed.Schedule b4 you leave. Future Appointments  Date Time Provider Department Center  12/26/2022  1:20 PM Shelva Majestic, MD [facility]  01/09/2023  2:00 PM Wyline Beady A, NP [facility] None  01/30/2023  4:00 PM [facility] LAB [facility] None  02/01/2023  8:15 AM Serena Croissant, MD [facility] None  10/30/2023  9:00 AM Shelva Majestic, MD [facility]    Lab/Order associations:   ICD-10-CM   1. GAD (generalized anxiety disorder)  F41.1     2. Panic attacks  F41.0     3. Major depressive disorder with single episode, in full remission (HCC)  F32.5     4. Iron deficiency anemia, unspecified iron deficiency anemia type  D50.9       Meds ordered this encounter  Medications   hydrOXYzine (ATARAX) 50 MG tablet    Sig:  Take 1 tablet (50 mg total) by mouth 3 (three) times daily as needed.    Dispense:  90 tablet    Refill:  2   LORazepam (ATIVAN) 0.5 MG tablet    Sig: Take 1 tablet (0.5 mg total) by mouth 2 (two) times daily as needed for anxiety (do not take for 8 hours after taking. backup if hydroxyzine not effective.).    Dispense:  15 tablet    Refill:  1   FLUoxetine (PROZAC) 10 MG capsule    Sig: Take 1 capsule (10 mg total) by mouth daily.    Dispense:  30 capsule    Refill:  5    Return precautions advised.  Tana Conch, MD

## 2022-11-17 DIAGNOSIS — F331 Major depressive disorder, recurrent, moderate: Secondary | ICD-10-CM | POA: Diagnosis not present

## 2022-11-17 DIAGNOSIS — F410 Panic disorder [episodic paroxysmal anxiety] without agoraphobia: Secondary | ICD-10-CM | POA: Diagnosis not present

## 2022-11-21 DIAGNOSIS — D649 Anemia, unspecified: Secondary | ICD-10-CM | POA: Diagnosis not present

## 2022-11-21 DIAGNOSIS — F331 Major depressive disorder, recurrent, moderate: Secondary | ICD-10-CM | POA: Diagnosis not present

## 2022-11-21 DIAGNOSIS — F410 Panic disorder [episodic paroxysmal anxiety] without agoraphobia: Secondary | ICD-10-CM | POA: Diagnosis not present

## 2022-11-23 ENCOUNTER — Encounter: Payer: Self-pay | Admitting: Hematology and Oncology

## 2022-11-24 DIAGNOSIS — F410 Panic disorder [episodic paroxysmal anxiety] without agoraphobia: Secondary | ICD-10-CM | POA: Diagnosis not present

## 2022-11-24 DIAGNOSIS — F331 Major depressive disorder, recurrent, moderate: Secondary | ICD-10-CM | POA: Diagnosis not present

## 2022-11-28 DIAGNOSIS — F410 Panic disorder [episodic paroxysmal anxiety] without agoraphobia: Secondary | ICD-10-CM | POA: Diagnosis not present

## 2022-11-28 DIAGNOSIS — F331 Major depressive disorder, recurrent, moderate: Secondary | ICD-10-CM | POA: Diagnosis not present

## 2022-12-01 DIAGNOSIS — F331 Major depressive disorder, recurrent, moderate: Secondary | ICD-10-CM | POA: Diagnosis not present

## 2022-12-01 DIAGNOSIS — F410 Panic disorder [episodic paroxysmal anxiety] without agoraphobia: Secondary | ICD-10-CM | POA: Diagnosis not present

## 2022-12-05 DIAGNOSIS — F331 Major depressive disorder, recurrent, moderate: Secondary | ICD-10-CM | POA: Diagnosis not present

## 2022-12-05 DIAGNOSIS — F410 Panic disorder [episodic paroxysmal anxiety] without agoraphobia: Secondary | ICD-10-CM | POA: Diagnosis not present

## 2022-12-08 ENCOUNTER — Ambulatory Visit: Payer: BC Managed Care – PPO | Admitting: Family Medicine

## 2022-12-08 DIAGNOSIS — F331 Major depressive disorder, recurrent, moderate: Secondary | ICD-10-CM | POA: Diagnosis not present

## 2022-12-08 DIAGNOSIS — F410 Panic disorder [episodic paroxysmal anxiety] without agoraphobia: Secondary | ICD-10-CM | POA: Diagnosis not present

## 2022-12-12 DIAGNOSIS — F331 Major depressive disorder, recurrent, moderate: Secondary | ICD-10-CM | POA: Diagnosis not present

## 2022-12-12 DIAGNOSIS — F410 Panic disorder [episodic paroxysmal anxiety] without agoraphobia: Secondary | ICD-10-CM | POA: Diagnosis not present

## 2022-12-15 DIAGNOSIS — F410 Panic disorder [episodic paroxysmal anxiety] without agoraphobia: Secondary | ICD-10-CM | POA: Diagnosis not present

## 2022-12-15 DIAGNOSIS — F331 Major depressive disorder, recurrent, moderate: Secondary | ICD-10-CM | POA: Diagnosis not present

## 2022-12-26 ENCOUNTER — Ambulatory Visit (INDEPENDENT_AMBULATORY_CARE_PROVIDER_SITE_OTHER): Payer: BC Managed Care – PPO | Admitting: Family Medicine

## 2022-12-26 ENCOUNTER — Encounter: Payer: Self-pay | Admitting: Family Medicine

## 2022-12-26 VITALS — BP 100/68 | HR 78 | Temp 98.1°F | Ht 61.0 in | Wt 186.6 lb

## 2022-12-26 DIAGNOSIS — F411 Generalized anxiety disorder: Secondary | ICD-10-CM

## 2022-12-26 DIAGNOSIS — D509 Iron deficiency anemia, unspecified: Secondary | ICD-10-CM

## 2022-12-26 DIAGNOSIS — F410 Panic disorder [episodic paroxysmal anxiety] without agoraphobia: Secondary | ICD-10-CM

## 2022-12-26 DIAGNOSIS — F331 Major depressive disorder, recurrent, moderate: Secondary | ICD-10-CM | POA: Diagnosis not present

## 2022-12-26 DIAGNOSIS — F325 Major depressive disorder, single episode, in full remission: Secondary | ICD-10-CM

## 2022-12-26 NOTE — Progress Notes (Signed)
Phone [phone] In person visit   Subjective:   Amber Elliott is a 47 y.o. year old very pleasant female patient who presents for/with See problem oriented charting Chief Complaint  Patient presents with   Anxiety    Pt Is here for 6 weeks f/u     Past Medical History-  Patient Active Problem List   Diagnosis Date Noted   IDA (iron deficiency anemia) 09/29/2022    Priority: Medium    GAD (generalized anxiety disorder) 12/27/2021    Priority: Medium    Panic attacks 12/27/2021    Priority: Medium    Major depression in full remission (HCC) 11/12/2021    Priority: Medium    Attention deficit disorder (ADD) in adult 06/09/2014    Priority: Medium    Asthma 02/02/2007    Priority: Medium    Former smoker 10/06/2014    Priority: Low   Allergic rhinitis 02/02/2007    Priority: Low    Medications- reviewed and updated Current Outpatient Medications  Medication Sig Dispense Refill   albuterol (VENTOLIN HFA) 108 (90 Base) MCG/ACT inhaler INHALE 2 PUFFS BY MOUTH EVERY 6 HOURS AS NEEDED 8.5 g 1   estradiol (CLIMARA) 0.0375 mg/24hr patch Place 1 patch (0.0375 mg total) onto the skin once a week. 12 patch 0   FLUoxetine (PROZAC) 10 MG capsule Take 1 capsule (10 mg total) by mouth daily. 30 capsule 5   hydrOXYzine (ATARAX) 50 MG tablet Take 1 tablet (50 mg total) by mouth 3 (three) times daily as needed. 90 tablet 2   Multiple Vitamin (MULTIVITAMIN) capsule Take 1 capsule by mouth daily.     progesterone (PROMETRIUM) 100 MG capsule Take 1 capsule (100 mg total) by mouth daily. 90 capsule 0   tiZANidine (ZANAFLEX) 4 MG tablet      LORazepam (ATIVAN) 0.5 MG tablet Take 1 tablet (0.5 mg total) by mouth 2 (two) times daily as needed for anxiety (do not take for 8 hours after taking. backup if hydroxyzine not effective.). (Patient not taking: Reported on 12/26/2022) 15 tablet 1   No current facility-administered medications for this visit.     Objective:  BP 100/68   Pulse 78    Temp 98.1 F (36.7 C)   Ht 5\' 1"  (1.549 m)   Wt 186 lb 9.6 oz (84.6 kg)   SpO2 96%   BMI 35.26 kg/m  Gen: NAD, resting comfortably    Assessment and Plan   # Depression and history anxiety and panic attacks S: Medication:hydroxyzine at night, Prozac 10 mg started 11/14/22 by [place]- she started about a week later -prior severe situational stress related to work related to prior school- resolved at new school. Prior Wellbutrin in 2023 -was doing well June 2024 then symptoms worsened significantly- noted increased anxiety and panic attacks- may have bene "unmasked" by Eye Movement Desensitization and Reprocessing (EMDR) online with [facility]. Still doing 2x a week  -has remained off attention deficit disorder meds -tried hormone replacement therapy to see if it was helpful through GYN- minimal benefit at first but may have had delayed improvement - panic attacks increased to nearly every day by late June. Reflexted back may have been on Zoloft and luvox short term in college  Today she reports: -symptoms started to settle even before starting the Prozac and then noted less and less severe and less frequent after starting prozac -no side effects that she is aware of - only had to take a few ativan    12/26/2022  1:14 PM 11/14/2022    4:09 PM 10/28/2022   10:51 AM  Depression screen PHQ 2/9  Decreased Interest 0 0 0  Down, Depressed, Hopeless 0 0 0  PHQ - 2 Score 0 0 0  Altered sleeping 2 3 2   Tired, decreased energy 0 0 1  Change in appetite 0 3 0  Feeling bad or failure about yourself  0 1 0  Trouble concentrating 0 0 0  Moving slowly or fidgety/restless 0 0 0  Suicidal thoughts 0 0 0  PHQ-9 Score 2 7 3   Difficult doing work/chores Not difficult at all Not difficult at all Not difficult at all       12/26/2022    1:18 PM 11/14/2022    4:09 PM 10/28/2022   10:51 AM 08/01/2022   10:56 AM  GAD 7 : Generalized Anxiety Score  Nervous, Anxious, on Edge 1 3 1  0  Control/stop  worrying 1 3 1  0  Worry too much - different things 0 3 0 0  Trouble relaxing 0 3 1 0  Restless 0 3 0 0  Easily annoyed or irritable 1 1 0 0  Afraid - awful might happen 0 3 0 0  Total GAD 7 Score 3 19 3  0  Anxiety Difficulty Not difficult at all Somewhat difficult Not difficult at all Not difficult at all   A/P: Prior worsening of GAD and panic attacks may have been exacerbated by "unmasking" as she began her EMDR journey but she seems to have stabilized very well with continued EMDR therapy as well as fluoxetine 10 mg.  GAD appears to be well-controlled and panic attacks are now well-controlled.  Depression remains in full remission (but was not the major cause of concern at last visit)-continue current medication for all 3 issues.  Glad she is not meeting the lorazepam much anymore.  Can keep hydroxyzine available as needed -This is great timing with the start of the school year upon us-we discussed if we attempt medication wean in the future may be worth waiting until end of school year which can be a stressful time for her  # iron deficiency anemia  - required iron infisions may and June 2021 with Dr. Pamelia Hoit and again in 2024 x 2-  thankfully did not need 3rd iron infusion as hemoglobin up to 14 and ferritin up to 60.  Repeat labs planned in September  Recommended follow up: Return for next already scheduled visit or sooner if needed. Future Appointments  Date Time Provider Department Center  01/09/2023  2:00 PM Wyline Beady A, NP [facility] None  01/30/2023  4:00 PM [facility] None  02/01/2023  8:15 AM Serena Croissant, MD [facility] None  10/30/2023  9:00 AM Shelva Majestic, MD [facility]   Lab/Order associations:   ICD-10-CM   1. Major depressive disorder with single episode, in full remission (HCC)  F32.5     2. Panic attacks  F41.0     3. GAD (generalized anxiety disorder)  F41.1     4. Iron deficiency anemia, unspecified iron deficiency anemia type  D50.9       Return precautions advised.  Tana Conch, MD

## 2022-12-26 NOTE — Patient Instructions (Addendum)
Thrilled you are doing well- lets plan on continuing medicine at least through school year and then we can reassess  I wish you the best with the school year!   Recommended follow up: Return for next already scheduled visit or sooner if needed.

## 2022-12-30 DIAGNOSIS — M545 Low back pain, unspecified: Secondary | ICD-10-CM | POA: Diagnosis not present

## 2023-01-04 DIAGNOSIS — F410 Panic disorder [episodic paroxysmal anxiety] without agoraphobia: Secondary | ICD-10-CM | POA: Diagnosis not present

## 2023-01-04 DIAGNOSIS — F331 Major depressive disorder, recurrent, moderate: Secondary | ICD-10-CM | POA: Diagnosis not present

## 2023-01-09 ENCOUNTER — Ambulatory Visit: Payer: Medicaid Other | Admitting: Nurse Practitioner

## 2023-01-10 IMAGING — MG MM DIGITAL SCREENING BILAT W/ TOMO AND CAD
8 series · 8 of 24 positions shown · non-contrast
Comparison: Previous exam(s).

CLINICAL DATA: Screening.

EXAM:
DIGITAL SCREENING BILATERAL MAMMOGRAM WITH TOMOSYNTHESIS AND CAD
TECHNIQUE: Bilateral screening digital craniocaudal and mediolateral oblique
mammograms were obtained. Bilateral screening digital breast
tomosynthesis was performed. The images were evaluated with
computer-aided detection.

[R CC synth-2D]
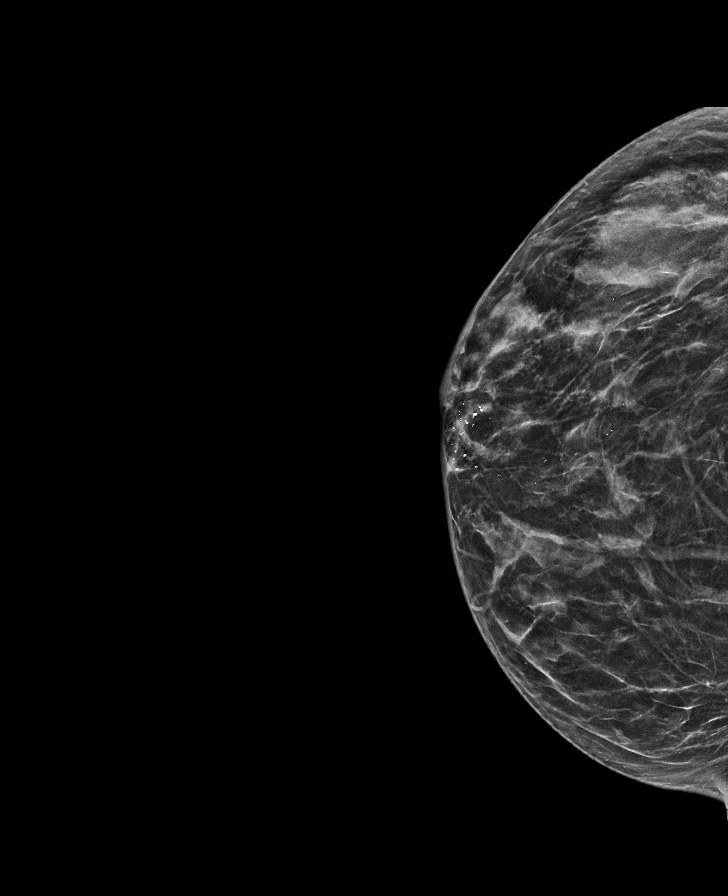

[R MLO synth-2D]
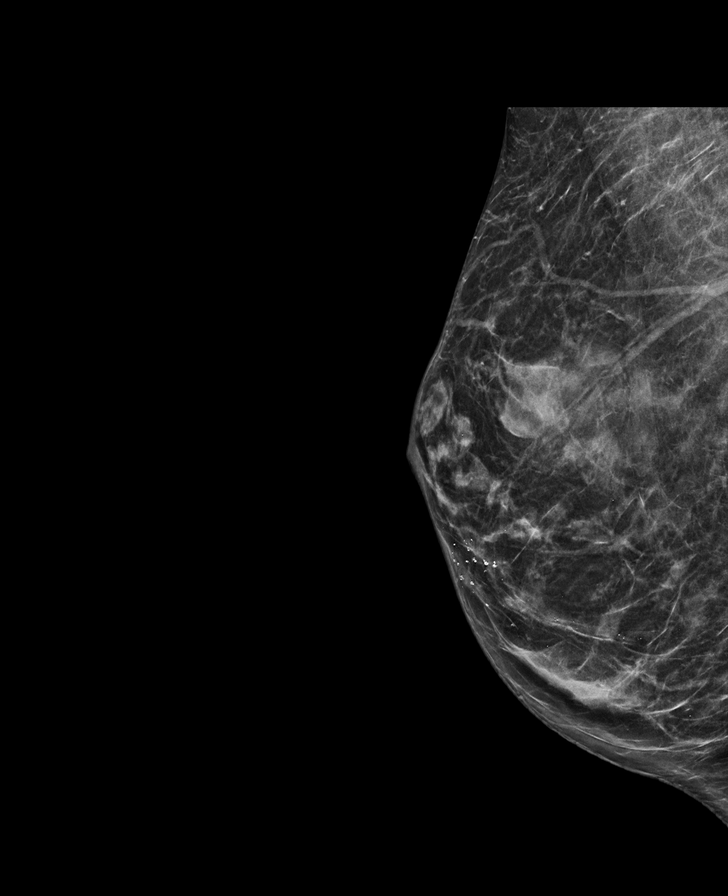

[L MLO synth-2D]
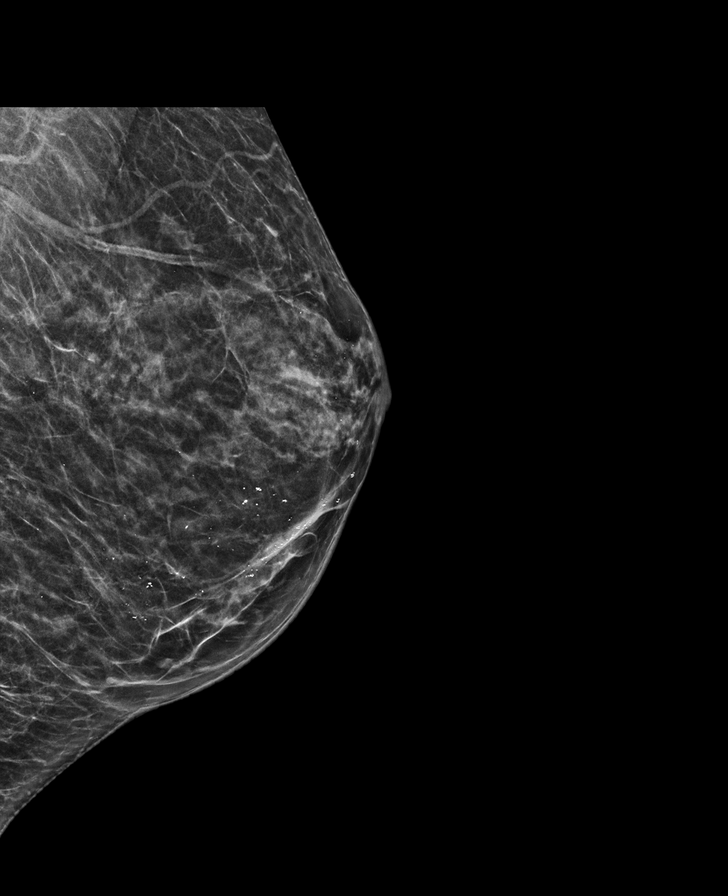

[L CC synth-2D]
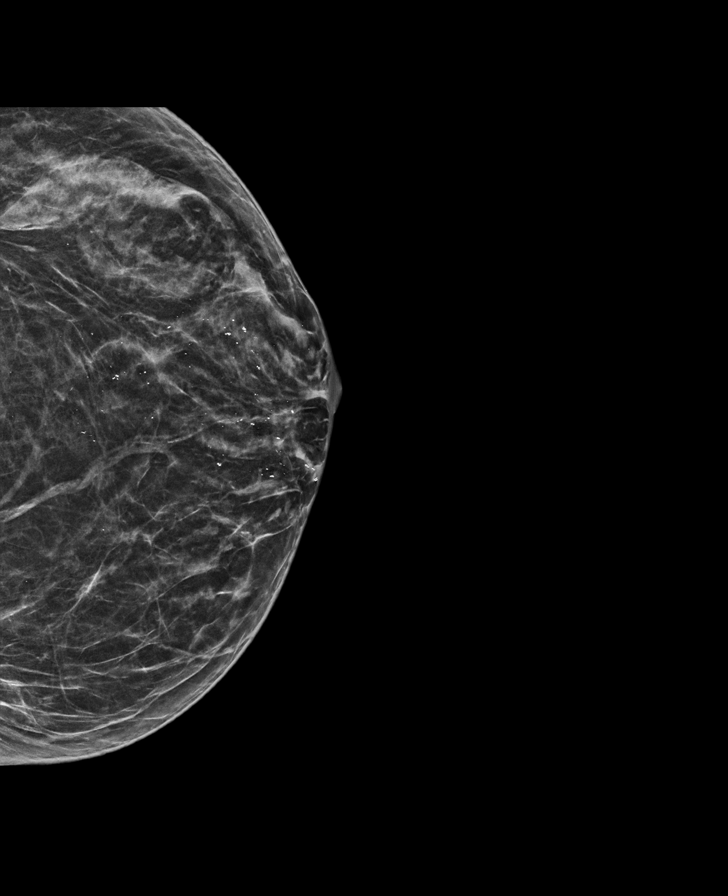

[R CC tomo · tomo slice 27/53.0]
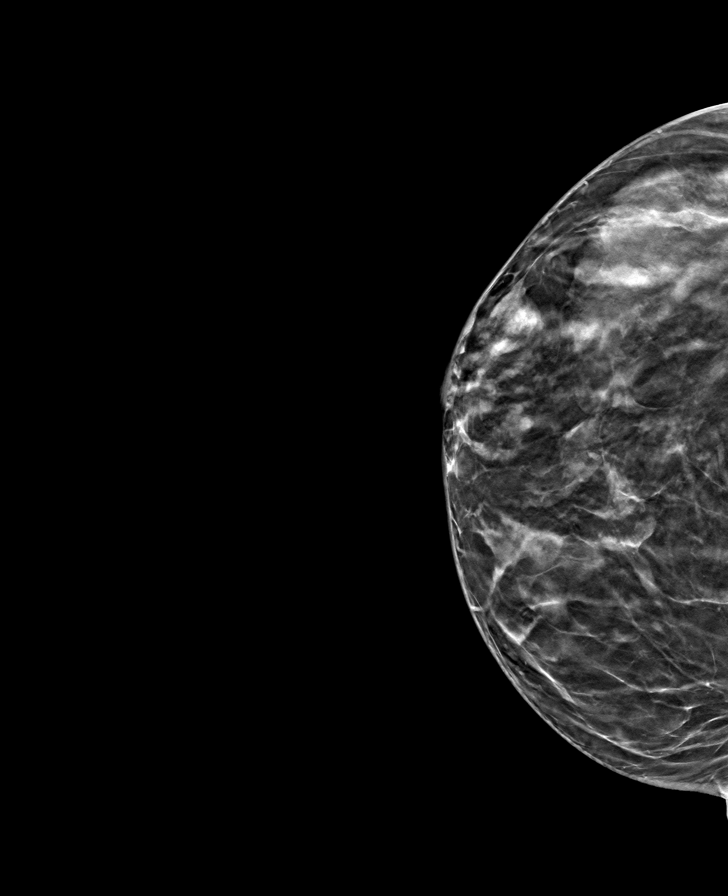

[L MLO tomo · tomo slice 27/53.0]
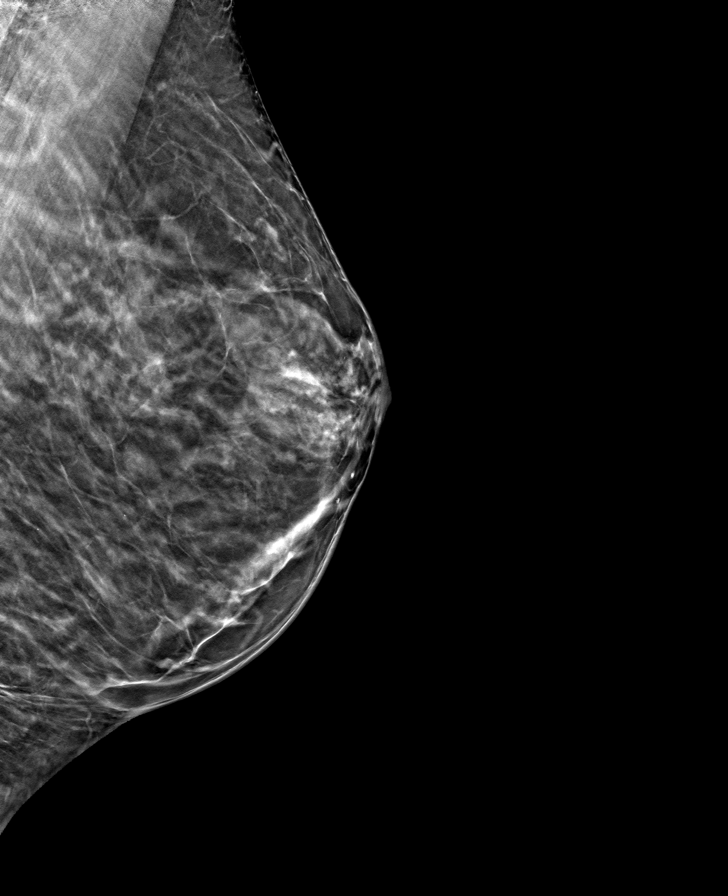

[R MLO tomo · tomo slice 29/56.0]
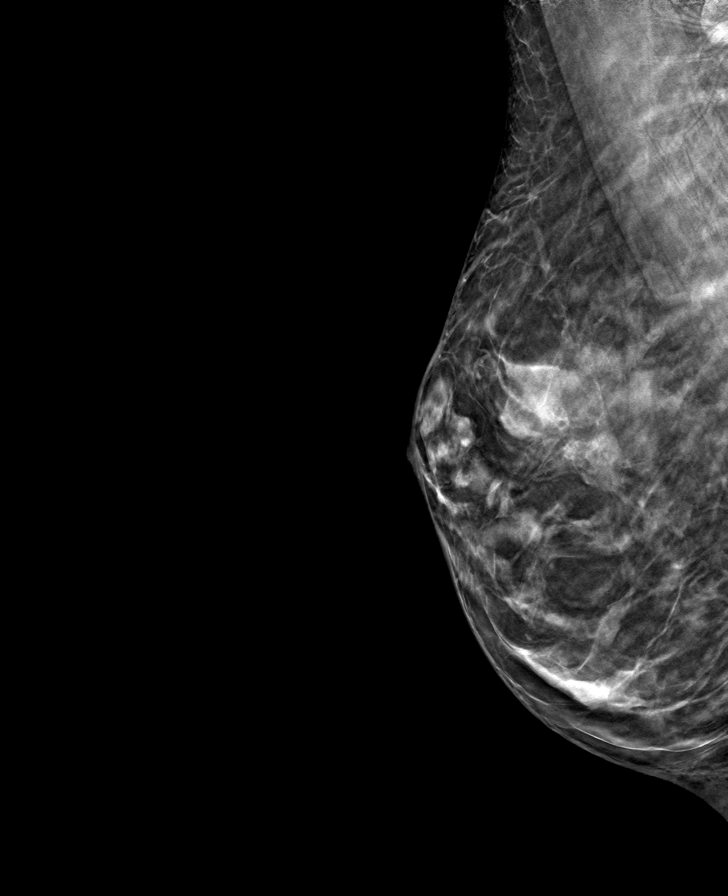

[L CC tomo · tomo slice 27/53.0]
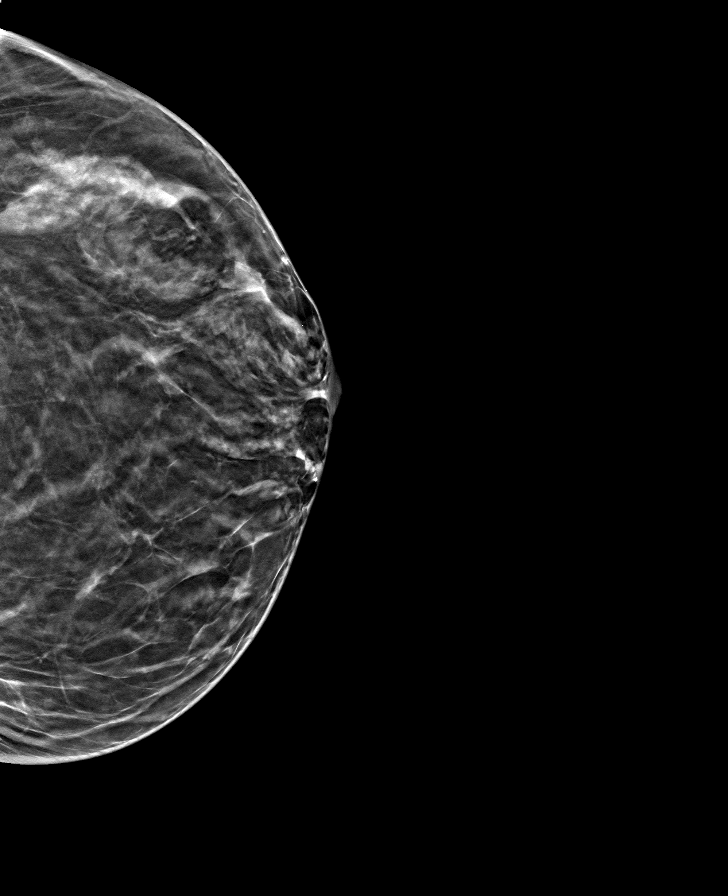

[8 of 24 positions shown; findings below may reference images not displayed]

ACR Breast Density Category c: The breast tissue is heterogeneously
dense, which may obscure small masses.
FINDINGS: There are no findings suspicious for malignancy.
IMPRESSION: No mammographic evidence of malignancy. A result letter of this
screening mammogram will be mailed directly to the patient.

RECOMMENDATION:
Screening mammogram in one year. (Code:[id number]-W-BC3)

BI-RADS CATEGORY  1: Negative.

## 2023-01-11 DIAGNOSIS — F331 Major depressive disorder, recurrent, moderate: Secondary | ICD-10-CM | POA: Diagnosis not present

## 2023-01-11 DIAGNOSIS — F410 Panic disorder [episodic paroxysmal anxiety] without agoraphobia: Secondary | ICD-10-CM | POA: Diagnosis not present

## 2023-01-16 DIAGNOSIS — F410 Panic disorder [episodic paroxysmal anxiety] without agoraphobia: Secondary | ICD-10-CM | POA: Diagnosis not present

## 2023-01-16 DIAGNOSIS — F331 Major depressive disorder, recurrent, moderate: Secondary | ICD-10-CM | POA: Diagnosis not present

## 2023-01-26 ENCOUNTER — Other Ambulatory Visit: Payer: Self-pay

## 2023-01-26 DIAGNOSIS — F331 Major depressive disorder, recurrent, moderate: Secondary | ICD-10-CM | POA: Diagnosis not present

## 2023-01-26 DIAGNOSIS — F410 Panic disorder [episodic paroxysmal anxiety] without agoraphobia: Secondary | ICD-10-CM | POA: Diagnosis not present

## 2023-01-26 DIAGNOSIS — D509 Iron deficiency anemia, unspecified: Secondary | ICD-10-CM

## 2023-01-30 ENCOUNTER — Inpatient Hospital Stay: Payer: BC Managed Care – PPO | Attending: Hematology and Oncology

## 2023-01-30 DIAGNOSIS — D509 Iron deficiency anemia, unspecified: Secondary | ICD-10-CM | POA: Insufficient documentation

## 2023-01-30 LAB — CMP (CANCER CENTER ONLY)
ALT: 10 U/L (ref 0–44)
AST: 16 U/L (ref 15–41)
Albumin: 4.4 g/dL (ref 3.5–5.0)
Alkaline Phosphatase: 57 U/L (ref 38–126)
Anion gap: 8 (ref 5–15)
BUN: 12 mg/dL (ref 6–20)
CO2: 25 mmol/L (ref 22–32)
Calcium: 9.5 mg/dL (ref 8.9–10.3)
Chloride: 104 mmol/L (ref 98–111)
Creatinine: 0.88 mg/dL (ref 0.44–1.00)
GFR, Estimated: >60 mL/min (ref >60)
Glucose, Bld: 89 mg/dL (ref 70–99)
Potassium: 3.8 mmol/L (ref 3.5–5.1)
Sodium: 137 mmol/L (ref 135–145)
Total Bilirubin: 0.6 mg/dL (ref 0.3–1.2)
Total Protein: 7.2 g/dL (ref 6.5–8.1)

## 2023-01-30 LAB — CBC WITH DIFFERENTIAL (CANCER CENTER ONLY)
Abs Immature Granulocytes: 0.02 10*3/uL (ref 0.00–0.07)
Basophils Absolute: 0.1 10*3/uL (ref 0.0–0.1)
Basophils Relative: 1 %
Eosinophils Absolute: 0.4 10*3/uL (ref 0.0–0.5)
Eosinophils Relative: 6 %
HCT: 41 % (ref 36.0–46.0)
Hemoglobin: 14.1 g/dL (ref 12.0–15.0)
Immature Granulocytes: 0 %
Lymphocytes Relative: 32 %
Lymphs Abs: 2.3 10*3/uL (ref 0.7–4.0)
MCH: 30.6 pg (ref 26.0–34.0)
MCHC: 34.4 g/dL (ref 30.0–36.0)
MCV: 88.9 fL (ref 80.0–100.0)
Monocytes Absolute: 0.5 10*3/uL (ref 0.1–1.0)
Monocytes Relative: 7 %
Neutro Abs: 3.9 10*3/uL (ref 1.7–7.7)
Neutrophils Relative %: 54 %
Platelet Count: 287 10*3/uL (ref 150–400)
RBC: 4.61 MIL/uL (ref 3.87–5.11)
RDW: 12.5 % (ref 11.5–15.5)
WBC Count: 7.1 10*3/uL (ref 4.0–10.5)
nRBC: 0 % (ref 0.0–0.2)

## 2023-01-30 LAB — IRON AND IRON BINDING CAPACITY (CC-WL,HP ONLY)
Iron: 79 ug/dL (ref 28–170)
Saturation Ratios: 22 % (ref 10.4–31.8)
TIBC: 367 ug/dL (ref 250–450)
UIBC: 288 ug/dL (ref 148–442)

## 2023-01-31 LAB — FERRITIN: Ferritin: 49 ng/mL (ref 11–307)

## 2023-02-01 ENCOUNTER — Inpatient Hospital Stay (HOSPITAL_BASED_OUTPATIENT_CLINIC_OR_DEPARTMENT_OTHER): Payer: BC Managed Care – PPO | Admitting: Hematology and Oncology

## 2023-02-01 DIAGNOSIS — D509 Iron deficiency anemia, unspecified: Secondary | ICD-10-CM | POA: Diagnosis not present

## 2023-02-01 NOTE — Assessment & Plan Note (Signed)
Lab review: 08/29/2022: Hemoglobin 12.5, MCV 82.9, RDW 17.8, ferritin 6.3, iron saturation 17.9% 01/30/2023: Hemoglobin 14.1, MCV 88.9, iron saturation 22%, ferritin 49  Iron deficiency without any significant anemia (severe fatigue) IV iron: May 2024 No indication for IV iron at this time.  Recheck labs in 6 months and telephone visit after that to discuss results

## 2023-02-01 NOTE — Progress Notes (Signed)
HEMATOLOGY-ONCOLOGY TELEPHONE VISIT PROGRESS NOTE  I connected with our patient on 02/01/23 at  8:15 AM EDT by telephone and verified that I am speaking with the correct person using two identifiers.  I discussed the limitations, risks, security and privacy concerns of performing an evaluation and management service by telephone and the availability of in person appointments.  I also discussed with the patient that there may be a patient responsible charge related to this service. The patient expressed understanding and agreed to proceed.   History of Present Illness: Marked improvement in energy levels after she received IV iron  REVIEW OF SYSTEMS:   Constitutional: Denies fevers, chills or abnormal weight loss All other systems were reviewed with the patient and are negative. Observations/Objective:     Assessment Plan:  IDA (iron deficiency anemia) Lab review: 08/29/2022: Hemoglobin 12.5, MCV 82.9, RDW 17.8, ferritin 6.3, iron saturation 17.9% 01/30/2023: Hemoglobin 14.1, MCV 88.9, iron saturation 22%, ferritin 49  Iron deficiency without any significant anemia (severe fatigue) The fatigue has resolved after the iron infusion.  IV iron: May 2024 No indication for IV iron at this time.  Recheck labs in 3 months and telephone visit after that to discuss results    I discussed the assessment and treatment plan with the patient. The patient was provided an opportunity to ask questions and all were answered. The patient agreed with the plan and demonstrated an understanding of the instructions. The patient was advised to call back or seek an in-person evaluation if the symptoms worsen or if the condition fails to improve as anticipated.   I provided 12 minutes of non-face-to-face time during this encounter.  This includes time for charting and coordination of care   Amber Meek, MD

## 2023-02-02 ENCOUNTER — Ambulatory Visit
Admission: RE | Admit: 2023-02-02 | Discharge: 2023-02-02 | Disposition: A | Discharge status: Home / Self Care | Payer: BC Managed Care – PPO | Source: Ambulatory Visit | Attending: Internal Medicine | Admitting: Internal Medicine

## 2023-02-02 VITALS — BP 116/80 | HR 77 | Temp 98.8°F | Resp 17

## 2023-02-02 DIAGNOSIS — F410 Panic disorder [episodic paroxysmal anxiety] without agoraphobia: Secondary | ICD-10-CM | POA: Diagnosis not present

## 2023-02-02 DIAGNOSIS — J069 Acute upper respiratory infection, unspecified: Secondary | ICD-10-CM

## 2023-02-02 DIAGNOSIS — R0982 Postnasal drip: Secondary | ICD-10-CM | POA: Diagnosis not present

## 2023-02-02 DIAGNOSIS — R070 Pain in throat: Secondary | ICD-10-CM | POA: Diagnosis not present

## 2023-02-02 DIAGNOSIS — F331 Major depressive disorder, recurrent, moderate: Secondary | ICD-10-CM | POA: Diagnosis not present

## 2023-02-02 LAB — POCT RAPID STREP A (OFFICE): Rapid Strep A Screen: NEGATIVE

## 2023-02-02 MED ORDER — PREDNISONE 10 MG PO TABS
30.0000 mg | ORAL_TABLET | Freq: Every day | ORAL | 0 refills | Status: DC
Start: 2023-02-02 — End: 2023-03-03

## 2023-02-02 NOTE — ED Triage Notes (Signed)
Pt presents with c/o sore throat x 1 wk. 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  Home interventions: Tylenol, cough drops and Sudafed

## 2023-02-02 NOTE — ED Provider Notes (Signed)
[facility]  Note:  This document was prepared using Conservation officer, historic buildings and may include unintentional dictation errors.  MRN: [id number] DOB: 09-24-1975  Subjective:   Amber Elliott is a 47 y.o. female presenting for 1 week history of acute onset persistent sinus congestion, post-nasal drainage.  Has a history of significant allergic rhinitis.  Has been using pseudoephedrine.  Also takes hydroxyzine.  No cough, chest pain, shortness of breath or wheezing.  Last sinus infection was severe about a year ago.  Had to undergo antibiotics and steroids in order to clear it.  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  No current facility-administered medications for this encounter.  Current Outpatient Medications:    albuterol (VENTOLIN HFA) 108 (90 Base) MCG/ACT inhaler, INHALE 2 PUFFS BY MOUTH EVERY 6 HOURS AS NEEDED, Disp: 8.5 g, Rfl: 1   estradiol (CLIMARA) 0.0375 mg/24hr patch, Place 1 patch (0.0375 mg total) onto the skin once a week., Disp: 12 patch, Rfl: 0   FLUoxetine (PROZAC) 10 MG capsule, Take 1 capsule (10 mg total) by mouth daily., Disp: 30 capsule, Rfl: 5   hydrOXYzine (ATARAX) 50 MG tablet, Take 1 tablet (50 mg total) by mouth 3 (three) times daily as needed., Disp: 90 tablet, Rfl: 2   LORazepam (ATIVAN) 0.5 MG tablet, Take 1 tablet (0.5 mg total) by mouth 2 (two) times daily as needed for anxiety (do not take for 8 hours after taking. backup if hydroxyzine not effective.). (Patient not taking: Reported on 12/26/2022), Disp: 15 tablet, Rfl: 1   Multiple Vitamin (MULTIVITAMIN) capsule, Take 1 capsule by mouth daily., Disp: , Rfl:    progesterone (PROMETRIUM) 100 MG capsule, Take 1 capsule (100 mg total) by mouth daily., Disp: 90 capsule, Rfl: 0   tiZANidine (ZANAFLEX) 4 MG tablet, , Disp: , Rfl:    No Known Allergies  Past Medical History:  Diagnosis Date   ALLERGIC RHINITIS 02/02/2007   Allergy     Anemia    slight    Anxiety 08/2021   ASTHMA 02/02/2007   Asthma    ATTENTION DEFICIT DISORDER, ADULT 05/14/2007   Depression 08/2021   GERD 05/14/2007   symptoms resolved when became vegetarian   IDA (iron deficiency anemia)    PONV (postoperative nausea and vomiting)    Sessile serrated polyp of colon      Past Surgical History:  Procedure Laterality Date   BREAST REDUCTION SURGERY     age 80- 6 lbs of tissue   EYE SURGERY     esotropia   LEEP     89-no abnormal paps since   REDUCTION MAMMAPLASTY Bilateral    TONSILLECTOMY     1996 or 1997    Family History  Problem Relation Age of Onset   Arthritis Mother    AAA (abdominal aortic aneurysm) Mother        s/p repair in 62s   Other Mother        atherosclerosis   Hearing loss Mother    Stomach cancer Father        102s. led to his death at 55.    Other Father        menetrier disease- massive gastric folds   Alcohol abuse Father    Cancer Father    Diabetes Half-Brother    Other Half-Brother        unknown cause of death   Healthy Half-Brother  Healthy Half-Brother    Drug abuse Half-Sister        long term issues    Heart failure Maternal Grandmother        84 diagnosed with this   Colon polyps Maternal Grandmother    Alcohol abuse Paternal Grandmother    Colon cancer Neg Hx    Esophageal cancer Neg Hx    Rectal cancer Neg Hx     Social History   Tobacco Use   Smoking status: Former    Current packs/day: 0.00    Average packs/day: 0.8 packs/day for 10.0 years (7.5 ttl pk-yrs)    Types: Cigarettes    Start date: 05/16/1989    Quit date: 05/17/1999    Years since quitting: 23.7   Smokeless tobacco: Never  Vaping Use   Vaping status: Never Used  Substance Use Topics   Alcohol use: Yes    Comment: I drink wine at special occassions   Drug use: No    ROS   Objective:   Vitals: BP 116/80 (BP Location: Right Arm)   Pulse 77   Temp 98.8 F (37.1 C) (Oral)   Resp 17   SpO2 95%    Physical Exam Constitutional:      General: She is not in acute distress.    Appearance: Normal appearance. She is well-developed and normal weight. She is not ill-appearing, toxic-appearing or diaphoretic.  HENT:     Head: Normocephalic and atraumatic.     Right Ear: Tympanic membrane, ear canal and external ear normal. No drainage or tenderness. No middle ear effusion. There is no impacted cerumen. Tympanic membrane is not erythematous or bulging.     Left Ear: Tympanic membrane, ear canal and external ear normal. No drainage or tenderness.  No middle ear effusion. There is no impacted cerumen. Tympanic membrane is not erythematous or bulging.     Nose: No congestion or rhinorrhea.     Mouth/Throat:     Mouth: Mucous membranes are moist. No oral lesions.     Pharynx: No pharyngeal swelling, oropharyngeal exudate, posterior oropharyngeal erythema or uvula swelling.     Tonsils: No tonsillar exudate or tonsillar abscesses.     Comments: Thick streaks of postnasal drainage overlying pharynx. Eyes:     General: No scleral icterus.       Right eye: No discharge.        Left eye: No discharge.     Extraocular Movements: Extraocular movements intact.     Right eye: Normal extraocular motion.     Left eye: Normal extraocular motion.     Conjunctiva/sclera: Conjunctivae normal.  Cardiovascular:     Rate and Rhythm: Normal rate.  Pulmonary:     Effort: Pulmonary effort is normal.  Musculoskeletal:     Cervical back: Normal range of motion and neck supple.  Lymphadenopathy:     Cervical: No cervical adenopathy.  Skin:    General: Skin is warm and dry.  Neurological:     General: No focal deficit present.     Mental Status: She is alert and oriented to person, place, and time.  Psychiatric:        Mood and Affect: Mood normal.        Behavior: Behavior normal.     Results for orders placed or performed during the hospital encounter of 02/02/23 (from the past 24 hour(s))  POCT  rapid strep A     Status: None   Collection Time: 02/02/23  5:53 PM  Result Value  Ref Range   Rapid Strep A Screen Negative Negative    Assessment and Plan :   PDMP not reviewed this encounter.  1. Viral upper respiratory infection   2. Throat pain   3. Post-nasal drainage    Strep culture pending.  In the context of her allergic rhinitis and significant sinus symptoms, recommended an oral prednisone course.  Otherwise we will manage with supportive care for a viral upper respiratory infection. Counseled patient on potential for adverse effects with medications prescribed/recommended today, ER and return-to-clinic precautions discussed, patient verbalized understanding.    Wallis Bamberg, PA-C 02/02/23 1758

## 2023-02-02 NOTE — Discharge Instructions (Signed)
We will manage this as a viral respiratory infection. For sore throat or cough try using a honey-based tea. Use 3 teaspoons of honey with juice squeezed from half lemon. Place shaved pieces of ginger into 1/2-1 cup of water and warm over stove top. Then mix the ingredients and repeat every 4 hours as needed. Please take Tylenol 650mg  once every 6 hours for fevers, aches and pains. Hydrate very well with at least 2 liters (64 ounces) of water. Eat light meals such as soups (chicken and noodles, chicken wild rice, vegetable).  Do not eat any foods that you are allergic to.  Start an antihistamine like Zyrtec (10mg  daily) for postnasal drainage, sinus congestion or keep taking the hydroxyzine.  You can take this together with prednisone to help with your sinus inflammation.  When you are done with the prednisone you can go back to using pseudoephedrine as needed.  We will update you on Monday with your strep culture results.  If everything is negative and the prednisone has not helped on Monday, call the clinic and we can try a course of antibiotics for a secondary bacterial sinus infection.

## 2023-02-03 ENCOUNTER — Telehealth: Payer: Self-pay | Admitting: Emergency Medicine

## 2023-02-03 LAB — CULTURE, GROUP A STREP (THRC)

## 2023-02-03 MED ORDER — AMOXICILLIN 500 MG PO CAPS: 500.0000 mg | ORAL_CAPSULE | Freq: Two times a day (BID) | ORAL | 0 refills | Status: AC

## 2023-02-03 NOTE — Telephone Encounter (Signed)
Amoxicillin for positive strep

## 2023-02-09 DIAGNOSIS — F410 Panic disorder [episodic paroxysmal anxiety] without agoraphobia: Secondary | ICD-10-CM | POA: Diagnosis not present

## 2023-02-09 DIAGNOSIS — F331 Major depressive disorder, recurrent, moderate: Secondary | ICD-10-CM | POA: Diagnosis not present

## 2023-02-16 DIAGNOSIS — F410 Panic disorder [episodic paroxysmal anxiety] without agoraphobia: Secondary | ICD-10-CM | POA: Diagnosis not present

## 2023-02-16 DIAGNOSIS — F331 Major depressive disorder, recurrent, moderate: Secondary | ICD-10-CM | POA: Diagnosis not present

## 2023-02-23 DIAGNOSIS — F331 Major depressive disorder, recurrent, moderate: Secondary | ICD-10-CM | POA: Diagnosis not present

## 2023-02-23 DIAGNOSIS — F410 Panic disorder [episodic paroxysmal anxiety] without agoraphobia: Secondary | ICD-10-CM | POA: Diagnosis not present

## 2023-03-02 DIAGNOSIS — F410 Panic disorder [episodic paroxysmal anxiety] without agoraphobia: Secondary | ICD-10-CM | POA: Diagnosis not present

## 2023-03-02 DIAGNOSIS — F331 Major depressive disorder, recurrent, moderate: Secondary | ICD-10-CM | POA: Diagnosis not present

## 2023-03-03 ENCOUNTER — Ambulatory Visit (INDEPENDENT_AMBULATORY_CARE_PROVIDER_SITE_OTHER): Payer: BC Managed Care – PPO | Admitting: Family Medicine

## 2023-03-03 VITALS — BP 148/86 | HR 108 | Temp 98.2°F | Ht 61.0 in | Wt 178.6 lb

## 2023-03-03 DIAGNOSIS — R030 Elevated blood-pressure reading, without diagnosis of hypertension: Secondary | ICD-10-CM

## 2023-03-03 DIAGNOSIS — H6992 Unspecified Eustachian tube disorder, left ear: Secondary | ICD-10-CM

## 2023-03-03 DIAGNOSIS — J301 Allergic rhinitis due to pollen: Secondary | ICD-10-CM

## 2023-03-03 DIAGNOSIS — J029 Acute pharyngitis, unspecified: Secondary | ICD-10-CM | POA: Diagnosis not present

## 2023-03-03 MED ORDER — FLUTICASONE PROPIONATE 50 MCG/ACT NA SUSP: 1.0000 | Freq: Every day | NASAL | 6 refills | Status: DC

## 2023-03-03 NOTE — Progress Notes (Signed)
Subjective  CC:  Chief Complaint  Patient presents with   Sore Throat    Pt stated that it feels like her throat is still sore but she really can not tell if it is her throat or her ear(Left)    Same day acute visit; PCP not available. New pt to me. Chart reviewed.  HPI: Amber Elliott is a 47 y.o. female who presents to the office today to address the problems listed above in the chief complaint. 47 year old female with asthma, allergies and anxiety history presents due to persistent left-sided throat and ear pain.  Reviewed ER notes from September 19.  Lab results below.  Diagnosed with strep throat and treated with amoxicillin for 10 days.  Symptoms definitely improved, however still with persistent mild left throat pain ear pain.  She admits to postnasal drainage and some mild allergy symptoms but no fevers chills significant sore throat cough malaise or shortness of breath.  She does have asthma and did use her inhaler this morning.  She is uncertain why her blood pressure is still high, she does not carry a diagnosis of hypertension.  She denies palpitations or chest pain.  She did drink coffee this morning and used albuterol.  No visits with results within 1 Day(s) from this visit.  Latest known visit with results is:  Admission on 02/02/2023, Discharged on 02/02/2023  Component Date Value Ref Range Status   Rapid Strep A Screen 02/02/2023 Negative  Negative Final   Specimen Description 02/02/2023 THROAT   Final   Special Requests 02/02/2023 NONE   Final   Culture 02/02/2023    Final                   Value:MODERATE GROUP A STREP (S.PYOGENES) ISOLATED Beta hemolytic streptococci are predictably susceptible to penicillin and other beta lactams. Susceptibility testing not routinely performed. Performed at [facility], [address] [place], [place] [address]    Report Status 02/02/2023 02/03/2023 FINAL   Final    Assessment  1. Sore throat   2. Eustachian tube  dysfunction, left   3. Seasonal allergic rhinitis due to pollen   4. Elevated blood pressure reading without diagnosis of hypertension      Plan  Sore throat and eustachian tube dysfunction likely residual from strep throat and/or allergic rhinitis: Exam is normal.  Education given.  Start Flonase and Zyrtec, Advil as needed and follow-up if not improving.  This should improve.  Will check strep culture to ensure clearance Reassured about blood pressure.  This did make her quite anxious.  She will check home blood pressure readings when she is feeling better.  She will follow-up with PCP if remains elevated  Follow up: As needed Visit date not found  Orders Placed This Encounter  Procedures   Culture, Group A Strep   Meds ordered this encounter  Medications   fluticasone (FLONASE) 50 MCG/ACT nasal spray    Sig: Place 1 spray into both nostrils daily.    Dispense:  16 g    Refill:  6      I reviewed the patients updated PMH, FH, and SocHx.    Patient Active Problem List   Diagnosis Date Noted   IDA (iron deficiency anemia) 09/29/2022   GAD (generalized anxiety disorder) 12/27/2021   Panic attacks 12/27/2021   Major depression in full remission (HCC) 11/12/2021   Former smoker 10/06/2014   Attention deficit disorder (ADD) in adult 06/09/2014   Allergic rhinitis  02/02/2007   Asthma 02/02/2007   Current Meds  Medication Sig   albuterol (VENTOLIN HFA) 108 (90 Base) MCG/ACT inhaler INHALE 2 PUFFS BY MOUTH EVERY 6 HOURS AS NEEDED   FLUoxetine (PROZAC) 10 MG capsule Take 1 capsule (10 mg total) by mouth daily.   fluticasone (FLONASE) 50 MCG/ACT nasal spray Place 1 spray into both nostrils daily.   hydrOXYzine (ATARAX) 50 MG tablet Take 1 tablet (50 mg total) by mouth 3 (three) times daily as needed.   LORazepam (ATIVAN) 0.5 MG tablet Take 1 tablet (0.5 mg total) by mouth 2 (two) times daily as needed for anxiety (do not take for 8 hours after taking. backup if hydroxyzine not  effective.).   Multiple Vitamin (MULTIVITAMIN) capsule Take 1 capsule by mouth daily.   tiZANidine (ZANAFLEX) 4 MG tablet     Allergies: Patient has No Known Allergies. Family History: Patient family history includes AAA (abdominal aortic aneurysm) in her mother; Alcohol abuse in her father and paternal grandmother; Arthritis in her mother; Cancer in her father; Colon polyps in her maternal grandmother; Diabetes in her half-brother; Drug abuse in her half-sister; Healthy in her half-brother and half-brother; Hearing loss in her mother; Heart failure in her maternal grandmother; Other in her father, half-brother, and mother; Stomach cancer in her father. Social History:  Patient  reports that she quit smoking about 23 years ago. Her smoking use included cigarettes. She started smoking about 33 years ago. She has a 7.5 pack-year smoking history. She has never used smokeless tobacco. She reports current alcohol use. She reports that she does not use drugs.  Review of Systems: Constitutional: Negative for fever malaise or anorexia Cardiovascular: negative for chest pain Respiratory: negative for SOB or persistent cough Gastrointestinal: negative for abdominal pain  Objective  Vitals: BP (!) 148/86   Pulse (!) 108   Temp 98.2 F (36.8 C)   Ht 5\' 1"  (1.549 m)   Wt 178 lb 9.6 oz (81 kg)   SpO2 97%   BMI 33.75 kg/m  General: no acute distress , A&Ox3 HEENT: PEERL, conjunctiva normal, neck is supple, TMs clear with normal landmarks bilaterally, oropharynx clear, no cervical lymphadenopathy Cardiovascular:  RRR without murmur or gallop.  Respiratory:  Good breath sounds bilaterally, CTAB with normal respiratory effort Skin:  Warm, no rashes  Commons side effects, risks, benefits, and alternatives for medications and treatment plan prescribed today were discussed, and the patient expressed understanding of the given instructions. Patient is instructed to call or message via MyChart if he/she  has any questions or concerns regarding our treatment plan. No barriers to understanding were identified. We discussed Red Flag symptoms and signs in detail. Patient expressed understanding regarding what to do in case of urgent or emergency type symptoms.  Medication list was reconciled, printed and provided to the patient in AVS. Patient instructions and summary information was reviewed with the patient as documented in the AVS. This note was prepared with assistance of Dragon voice recognition software. Occasional wrong-word or sound-a-like substitutions may have occurred due to the inherent limitations of voice recognition software

## 2023-03-05 LAB — CULTURE, GROUP A STREP
Micro Number: 15613828
SPECIMEN QUALITY:: ADEQUATE

## 2023-03-06 NOTE — Progress Notes (Signed)
See mychart note Dear Ms. Manson Passey, Your strep culture returned negative, showing you have cleared your infection. I hope you are feeling better.  Sincerely, Dr. Mardelle Matte

## 2023-03-10 DIAGNOSIS — F410 Panic disorder [episodic paroxysmal anxiety] without agoraphobia: Secondary | ICD-10-CM | POA: Diagnosis not present

## 2023-03-10 DIAGNOSIS — F331 Major depressive disorder, recurrent, moderate: Secondary | ICD-10-CM | POA: Diagnosis not present

## 2023-03-13 DIAGNOSIS — D219 Benign neoplasm of connective and other soft tissue, unspecified: Secondary | ICD-10-CM | POA: Diagnosis not present

## 2023-03-13 DIAGNOSIS — D500 Iron deficiency anemia secondary to blood loss (chronic): Secondary | ICD-10-CM | POA: Diagnosis not present

## 2023-03-13 DIAGNOSIS — Z124 Encounter for screening for malignant neoplasm of cervix: Secondary | ICD-10-CM | POA: Diagnosis not present

## 2023-03-13 DIAGNOSIS — Z3202 Encounter for pregnancy test, result negative: Secondary | ICD-10-CM | POA: Diagnosis not present

## 2023-03-13 DIAGNOSIS — M545 Low back pain, unspecified: Secondary | ICD-10-CM | POA: Diagnosis not present

## 2023-03-13 DIAGNOSIS — N939 Abnormal uterine and vaginal bleeding, unspecified: Secondary | ICD-10-CM | POA: Diagnosis not present

## 2023-03-14 ENCOUNTER — Other Ambulatory Visit: Payer: Self-pay | Admitting: Family Medicine

## 2023-03-16 DIAGNOSIS — F410 Panic disorder [episodic paroxysmal anxiety] without agoraphobia: Secondary | ICD-10-CM | POA: Diagnosis not present

## 2023-03-16 DIAGNOSIS — F331 Major depressive disorder, recurrent, moderate: Secondary | ICD-10-CM | POA: Diagnosis not present

## 2023-04-18 ENCOUNTER — Inpatient Hospital Stay: Payer: BC Managed Care – PPO | Attending: Hematology and Oncology

## 2023-04-18 ENCOUNTER — Encounter: Payer: Self-pay | Admitting: Hematology and Oncology

## 2023-04-18 DIAGNOSIS — D259 Leiomyoma of uterus, unspecified: Secondary | ICD-10-CM | POA: Insufficient documentation

## 2023-04-18 DIAGNOSIS — D509 Iron deficiency anemia, unspecified: Secondary | ICD-10-CM | POA: Diagnosis present

## 2023-04-18 LAB — CBC WITH DIFFERENTIAL (CANCER CENTER ONLY)
Abs Immature Granulocytes: 0.02 10*3/uL (ref 0.00–0.07)
Basophils Absolute: 0.1 10*3/uL (ref 0.0–0.1)
Basophils Relative: 1 %
Eosinophils Absolute: 0.4 10*3/uL (ref 0.0–0.5)
Eosinophils Relative: 7 %
HCT: 39.2 % (ref 36.0–46.0)
Hemoglobin: 13.5 g/dL (ref 12.0–15.0)
Immature Granulocytes: 0 %
Lymphocytes Relative: 32 %
Lymphs Abs: 2 10*3/uL (ref 0.7–4.0)
MCH: 31.3 pg (ref 26.0–34.0)
MCHC: 34.4 g/dL (ref 30.0–36.0)
MCV: 90.7 fL (ref 80.0–100.0)
Monocytes Absolute: 0.4 10*3/uL (ref 0.1–1.0)
Monocytes Relative: 6 %
Neutro Abs: 3.4 10*3/uL (ref 1.7–7.7)
Neutrophils Relative %: 54 %
Platelet Count: 254 10*3/uL (ref 150–400)
RBC: 4.32 MIL/uL (ref 3.87–5.11)
RDW: 12.4 % (ref 11.5–15.5)
WBC Count: 6.3 10*3/uL (ref 4.0–10.5)
nRBC: 0 % (ref 0.0–0.2)

## 2023-04-18 LAB — IRON AND IRON BINDING CAPACITY (CC-WL,HP ONLY)
Iron: 144 ug/dL (ref 28–170)
Saturation Ratios: 39 % — ABNORMAL HIGH (ref 10.4–31.8)
TIBC: 372 ug/dL (ref 250–450)
UIBC: 228 ug/dL (ref 148–442)

## 2023-04-19 LAB — FERRITIN: Ferritin: 20 ng/mL (ref 11–307)

## 2023-04-19 NOTE — Assessment & Plan Note (Addendum)
Lab review: 08/29/2022: Hemoglobin 12.5, MCV 82.9, RDW 17.8, ferritin 6.3, iron saturation 17.9% 01/30/2023: Hemoglobin 14.1, MCV 88.9, iron saturation 22%, ferritin 49 04/18/2023: Hemoglobin 13.5, MCV 90.7, iron saturation 39%, ferritin 20   Iron deficiency without any significant anemia (severe fatigue) The fatigue has resolved after the iron infusion.   IV iron: May 2024 No indication for IV iron at this time. Patient had endoscopies capsule endoscopy and no evidence of bleeding was detected. Gyn evaluation: No active bleeding issues.  Ovarian cyst was detected. Therefore the cause of iron deficiency is felt to be malabsorption.   Recheck labs in 3 months and telephone visit after that to discuss results

## 2023-04-21 ENCOUNTER — Inpatient Hospital Stay (HOSPITAL_BASED_OUTPATIENT_CLINIC_OR_DEPARTMENT_OTHER): Payer: BC Managed Care – PPO | Admitting: Hematology and Oncology

## 2023-04-21 DIAGNOSIS — D509 Iron deficiency anemia, unspecified: Secondary | ICD-10-CM

## 2023-04-21 NOTE — Progress Notes (Signed)
HEMATOLOGY-ONCOLOGY TELEPHONE VISIT PROGRESS NOTE  I connected with our patient on 04/21/23 at  8:45 AM EST by telephone and verified that I am speaking with the correct person using two identifiers.  I discussed the limitations, risks, security and privacy concerns of performing an evaluation and management service by telephone and the availability of in person appointments.  I also discussed with the patient that there may be a patient responsible charge related to this service. The patient expressed understanding and agreed to proceed.   History of Present Illness:  Discussed the use of AI scribe software for clinical note transcription with the patient, who gave verbal consent to proceed.  History of Present Illness   The patient, with a history of iron deficiency, presents for a follow-up after iron infusions. She reports feeling well and denies any significant fatigue. She has been proactive in managing her iron levels, which have improved since the infusions. However, she expresses concern about a slightly elevated iron saturation level and questions if it could be related to a recently discovered cyst and fibroids identified by her OB/GYN. She denies any connection between these findings and her iron levels. She has previously undergone extensive gastrointestinal investigations, including colonoscopy, endoscopy, and capsule endoscopy, which have ruled out any bleeding source. The patient's iron deficiency is suspected to be due to an absorption issue, although her current iron saturation suggests adequate absorption.       REVIEW OF SYSTEMS:   Constitutional: Denies fevers, chills or abnormal weight loss All other systems were reviewed with the patient and are negative. Observations/Objective:     Assessment Plan:  IDA (iron deficiency anemia) Lab review: 08/29/2022: Hemoglobin 12.5, MCV 82.9, RDW 17.8, ferritin 6.3, iron saturation 17.9% 01/30/2023: Hemoglobin 14.1, MCV 88.9, iron  saturation 22%, ferritin 49 04/18/2023: Hemoglobin 13.5, MCV 90.7, iron saturation 39%, ferritin 20   Iron deficiency without any significant anemia (severe fatigue) The fatigue has resolved after the iron infusion.   IV iron: May 2024 No indication for IV iron at this time. Patient had endoscopies capsule endoscopy and no evidence of bleeding was detected. Gyn evaluation: No active bleeding issues.  Ovarian cyst was detected. Therefore the cause of iron deficiency is felt to be malabsorption.   --------------------------------- Assessment and Plan    Iron Deficiency Anemia Hemoglobin and iron saturation are within normal limits. Ferritin levels have decreased from 60 to 20 since May, indicating a downward trend in iron stores. No current need for iron supplementation. Possible absorption issue, but iron saturation of 39% suggests adequate absorption. -Return for follow-up and blood work in three months.  Uterine Fibroids and Cyst Identified on recent OB/GYN ultrasound. No connection to iron deficiency anemia. -No specific plan discussed in this conversation.  General Health Maintenance -Continue monitoring for any changes or symptoms. Contact the office if any concerns arise before the next scheduled appointment.      Recheck labs in 3 months and telephone visit after that to discuss results     I discussed the assessment and treatment plan with the patient. The patient was provided an opportunity to ask questions and all were answered. The patient agreed with the plan and demonstrated an understanding of the instructions. The patient was advised to call back or seek an in-person evaluation if the symptoms worsen or if the condition fails to improve as anticipated.   I provided 12 minutes of non-face-to-face time during this encounter.  This includes time for charting and coordination of care   [facility]  Marcelline Mates, MD

## 2023-04-21 NOTE — Addendum Note (Signed)
Addended by: Serena Croissant on: 04/21/2023 08:50 AM   Modules accepted: Orders

## 2023-04-22 ENCOUNTER — Telehealth: Payer: Self-pay | Admitting: Hematology and Oncology

## 2023-04-22 NOTE — Telephone Encounter (Signed)
Let patient a vm regarding upcoming appointment

## 2023-05-03 ENCOUNTER — Encounter: Payer: Self-pay | Admitting: Hematology and Oncology

## 2023-05-15 ENCOUNTER — Other Ambulatory Visit: Payer: Self-pay | Admitting: Family Medicine

## 2023-05-15 ENCOUNTER — Ambulatory Visit (INDEPENDENT_AMBULATORY_CARE_PROVIDER_SITE_OTHER): Payer: BC Managed Care – PPO | Admitting: Family Medicine

## 2023-05-15 ENCOUNTER — Encounter: Payer: Self-pay | Admitting: Hematology and Oncology

## 2023-05-15 VITALS — BP 126/72 | HR 86 | Temp 97.9°F | Ht 61.0 in | Wt 179.4 lb

## 2023-05-15 DIAGNOSIS — J329 Chronic sinusitis, unspecified: Secondary | ICD-10-CM

## 2023-05-15 DIAGNOSIS — Z1231 Encounter for screening mammogram for malignant neoplasm of breast: Secondary | ICD-10-CM

## 2023-05-15 DIAGNOSIS — B9689 Other specified bacterial agents as the cause of diseases classified elsewhere: Secondary | ICD-10-CM

## 2023-05-15 MED ORDER — AMOXICILLIN-POT CLAVULANATE 875-125 MG PO TABS: 1.0000 | ORAL_TABLET | Freq: Two times a day (BID) | ORAL | 0 refills | Status: AC

## 2023-05-15 NOTE — Patient Instructions (Addendum)
sinus pressure, congestion for almost 3 weeks- we suspect bacterial sinusitis and will cover with Augmentin. She did have a walking pneumonia exposure and we discussed if fails to improve or symptoms worsen to let me know as would add azithromycin.   - with fatigue issues acutely does not make sense to check ferritin levels in light of infection but needs to follow up with Dr. Pamelia Hoit when feeling better  Recommended follow up: Return for as needed for new, worsening, persistent symptoms.

## 2023-05-15 NOTE — Progress Notes (Signed)
Phone [phone] In person visit   Subjective:   Amber Elliott is a 47 y.o. year old very pleasant female patient who presents for/with See problem oriented charting Chief Complaint  Patient presents with   Cough    Yellow mucus/phlegm    Fatigue   Past Medical History-  Patient Active Problem List   Diagnosis Date Noted   IDA (iron deficiency anemia) 09/29/2022    Priority: Medium    GAD (generalized anxiety disorder) 12/27/2021    Priority: Medium    Panic attacks 12/27/2021    Priority: Medium    Major depression in full remission (HCC) 11/12/2021    Priority: Medium    Attention deficit disorder (ADD) in adult 06/09/2014    Priority: Medium    Asthma 02/02/2007    Priority: Medium    Former smoker 10/06/2014    Priority: Low   Allergic rhinitis 02/02/2007    Priority: Low    Medications- reviewed and updated Current Outpatient Medications  Medication Sig Dispense Refill   albuterol (VENTOLIN HFA) 108 (90 Base) MCG/ACT inhaler INHALE 2 PUFFS BY MOUTH EVERY 6 HOURS AS NEEDED 8.5 g 1   amoxicillin-clavulanate (AUGMENTIN) 875-125 MG tablet Take 1 tablet by mouth 2 (two) times daily for 7 days. 14 tablet 0   FLUoxetine (PROZAC) 10 MG capsule Take 1 capsule (10 mg total) by mouth daily. 30 capsule 5   fluticasone (FLONASE) 50 MCG/ACT nasal spray Place 1 spray into both nostrils daily. 16 g 6   hydrOXYzine (ATARAX) 50 MG tablet Take 1 tablet (50 mg total) by mouth 3 (three) times daily as needed. 90 tablet 2   LORazepam (ATIVAN) 0.5 MG tablet TAKE 1 TABLET(0.5 MG) BY MOUTH TWICE DAILY AS NEEDED FOR ANXIETY. DO NOT TAKE FOR 8 HOURS AFTER TAKING. BACKUP IF HYDROXYZINE NOT EFFECTIVE 15 tablet 0   Multiple Vitamin (MULTIVITAMIN) capsule Take 1 capsule by mouth daily.     tiZANidine (ZANAFLEX) 4 MG tablet      No current facility-administered medications for this visit.     Objective:  BP 126/72 (BP Location: Left Arm, Patient Position: Sitting, Cuff Size: Normal)    Pulse 86   Temp 97.9 F (36.6 C) (Temporal)   Ht 5\' 1"  (1.549 m)   Wt 179 lb 6.4 oz (81.4 kg)   SpO2 97%   BMI 33.90 kg/m  Gen: NAD, resting comfortably Right frontal sinus pressure on exam, pharynx with drainage and erythema, tympanic membrane normal, tender right sided lymphadenopathy (she knows to let us know if not improving within a month of treatment) CV: RRR no murmurs rubs or gallops Lungs: CTAB no crackles, wheeze, rhonchi Ext: no edema Skin: warm, dry     Assessment and Plan   # cough/congestion/sinus pressure S:patient hasn't been feeling well for over 3 weeks at this point.  She was seen at [facility] on 05/08/2023-at that time complained of cough, sore throat x 2 days, fever  the night before and fatigue for about 2 weeks already.  She had tried over-the-counter cough and cold medications with minimal relief.  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 Reports productive cough- was green now more yellow.  Some pain in throat with drainage. On Friday noted  temp up to 101.4 but nothing since then. Has been resting a lot but still feels tired. Tried a different cough medicine which didn't sit well- felt more congested and perhaps mildly shortness of breath but better today- no shortness of breath reported.  -did have exposure over Christmas with walking pneumonia  A/P: 47 yo female with sinus pressure, congestion for almost 3 weeks- we suspect bacterial sinusitis and will cover with Augmentin. She did have a walking pneumonia exposure and we discussed if fails to improve or symptoms worsen to let me know as would add azithromycin.   - with fatigue issues acutely does not make sense to check ferritin  levels in light of infection but needs to follow up with Dr. Pamelia Hoit when feeling better  Recommended follow up: Return for as needed for new, worsening, persistent symptoms. Future Appointments  Date Time Provider Department Center  05/29/2023  4:40 PM [facility] 3 [facility]  07/24/2023  2:45 PM [facility] None  07/27/2023  3:15 PM Serena Croissant, MD [facility] None  10/30/2023  9:00 AM Shelva Majestic, MD [facility]   Lab/Order associations:   ICD-10-CM   1. Bacterial sinusitis  J32.9    B96.89       Meds ordered this encounter  Medications   amoxicillin-clavulanate (AUGMENTIN) 875-125 MG tablet    Sig: Take 1 tablet by mouth 2 (two) times daily for 7 days.    Dispense:  14 tablet    Refill:  0    Return precautions advised.  Tana Conch, MD

## 2023-05-26 ENCOUNTER — Encounter: Payer: Self-pay | Admitting: Family Medicine

## 2023-05-29 ENCOUNTER — Ambulatory Visit
Admission: RE | Admit: 2023-05-29 | Discharge: 2023-05-29 | Disposition: A | Discharge status: Home / Self Care | Payer: Medicaid Other | Source: Ambulatory Visit

## 2023-05-29 DIAGNOSIS — Z1231 Encounter for screening mammogram for malignant neoplasm of breast: Secondary | ICD-10-CM

## 2023-05-30 ENCOUNTER — Other Ambulatory Visit: Payer: Self-pay | Admitting: Internal Medicine

## 2023-05-30 DIAGNOSIS — J4541 Moderate persistent asthma with (acute) exacerbation: Secondary | ICD-10-CM

## 2023-05-31 ENCOUNTER — Ambulatory Visit (INDEPENDENT_AMBULATORY_CARE_PROVIDER_SITE_OTHER): Payer: 59 | Admitting: Family Medicine

## 2023-05-31 ENCOUNTER — Encounter: Payer: Self-pay | Admitting: Family Medicine

## 2023-05-31 VITALS — BP 124/79 | HR 80 | Temp 98.0°F | Resp 18 | Ht 61.0 in | Wt 188.5 lb

## 2023-05-31 DIAGNOSIS — J40 Bronchitis, not specified as acute or chronic: Secondary | ICD-10-CM | POA: Diagnosis not present

## 2023-05-31 MED ORDER — PREDNISONE 20 MG PO TABS: 40.0000 mg | ORAL_TABLET | Freq: Every day | ORAL | 0 refills | Status: AC

## 2023-05-31 MED ORDER — AZITHROMYCIN 250 MG PO TABS: ORAL_TABLET | ORAL | 0 refills | Status: AC

## 2023-05-31 NOTE — Progress Notes (Signed)
 Subjective:     Patient ID: Amber Elliott, female    DOB: 02-01-1976, 48 y.o.   MRN: [id number]  Chief Complaint  Patient presents with   Nausea   Fatigue    Tired all day Sx started    Oral Swelling    Throat starting to feel swollen with neck pain   Cough    Sx started Dec 13, noticed blood yesterday and today Feels like fluid in lungs   Laryngitis    Completed Augmentin  about 1 week ago     HPI Discussed the use of AI scribe software for clinical note transcription with the patient, who gave verbal consent to proceed.  History of Present Illness   The patient, with a history of anemia and a recent diagnosis of an ovarian cyst, initially presented with an upper respiratory illness around December 13th. Despite multiple tests for COVID, flu, and strep at an urgent care visit on December 23rd, all results returned negative. The patient then saw Dr. Arlene Ben on December 30th due to the persistence of the illness for three weeks. Augmentin  was prescribed for a week, which provided some relief but did not fully resolve the symptoms..  home covid negative last pm  The patient has been experiencing fatigue, which she attributes to either the ongoing illness or a potential decrease in her ferritin levels, previously managed with iron  infusions. She also noted a painful nodule on the right side of her neck, which she suspects might be due to drainage.  The patient reported feeling better after the Augmentin  course but never returned to her baseline health. She then started feeling sick again, with her throat feeling swollen. She also experienced a cough, which produced a significant amount of phlegm, occasionally tinged with blood. The patient has had to use her inhaler more frequently due to feelings of tightness, although she denies any significant shortness of breath. She has not been running significant fevers.  The patient has been on Advair and albuterol  for her respiratory symptoms  but has not been on any steroids. She has been dealing with laryngitis since Christmas. The patient has not been prescribed any new medications and has no known allergies.       Health Maintenance Due  Topic Date Due   INFLUENZA VACCINE  05/08/2023    Past Medical History:  Diagnosis Date   ALLERGIC RHINITIS 02/02/2007   Allergy    Anemia    slight    Anxiety 08/2021   ASTHMA 02/02/2007   Asthma    ATTENTION DEFICIT DISORDER, ADULT 05/14/2007   Depression 08/2021   GERD 05/14/2007   symptoms resolved when became vegetarian   IDA (iron  deficiency anemia)    PONV (postoperative nausea and vomiting)    Sessile serrated polyp of colon     Past Surgical History:  Procedure Laterality Date   BREAST REDUCTION SURGERY     age 67- 6 lbs of tissue   EYE SURGERY     esotropia   LEEP     89-no abnormal paps since   REDUCTION MAMMAPLASTY Bilateral    @ age 42   TONSILLECTOMY     46 or 78     Current Outpatient Medications:    ADVAIR HFA 115-21 MCG/ACT inhaler, INHALE 2 PUFFS INTO THE LUNGS TWICE DAILY, Disp: 12 g, Rfl: 4   albuterol  (VENTOLIN  HFA) 108 (90 Base) MCG/ACT inhaler, INHALE 2 PUFFS BY MOUTH EVERY 6 HOURS AS NEEDED, Disp: 8.5 g, Rfl: 1  azithromycin  (ZITHROMAX ) 250 MG tablet, Take 2 tablets on day 1, then 1 tablet daily on days 2 through 5, Disp: 6 tablet, Rfl: 0   cyclobenzaprine (FLEXERIL) 5 MG tablet, Take 5-10 mg by mouth at bedtime as needed., Disp: , Rfl:    estradiol  (VIVELLE -DOT) 0.0375 MG/24HR, Place onto the skin., Disp: , Rfl:    FLUoxetine  (PROZAC ) 10 MG capsule, Take 1 capsule (10 mg total) by mouth daily., Disp: 30 capsule, Rfl: 5   fluticasone  (FLONASE ) 50 MCG/ACT nasal spray, Place 1 spray into both nostrils daily., Disp: 16 g, Rfl: 6   hydrOXYzine  (ATARAX ) 50 MG tablet, Take 1 tablet (50 mg total) by mouth 3 (three) times daily as needed., Disp: 90 tablet, Rfl: 2   LORazepam  (ATIVAN ) 0.5 MG tablet, TAKE 1 TABLET(0.5 MG) BY MOUTH TWICE DAILY AS  NEEDED FOR ANXIETY. DO NOT TAKE FOR 8 HOURS AFTER TAKING. BACKUP IF HYDROXYZINE  NOT EFFECTIVE, Disp: 15 tablet, Rfl: 0   Multiple Vitamin (MULTIVITAMIN) capsule, Take 1 capsule by mouth daily., Disp: , Rfl:    predniSONE  (DELTASONE ) 20 MG tablet, Take 2 tablets (40 mg total) by mouth daily with breakfast for 5 days., Disp: 10 tablet, Rfl: 0   tiZANidine (ZANAFLEX) 4 MG tablet, , Disp: , Rfl:   No Known Allergies ROS neg/noncontributory except as noted HPI/below      Objective:     BP 124/79   Pulse 80   Temp 98 F (36.7 C) (Temporal)   Resp 18   Ht 5\' 1"  (1.549 m)   Wt 188 lb 8 oz (85.5 kg)   LMP 01/15/2023   SpO2 97%   BMI 35.62 kg/m  Wt Readings from Last 3 Encounters:  05/31/23 188 lb 8 oz (85.5 kg)  05/15/23 179 lb 6.4 oz (81.4 kg)  03/03/23 178 lb 9.6 oz (81 kg)    Physical Exam   Gen: WDWN NAD HEENT: NCAT, conjunctiva not injected, sclera nonicteric TM WNL B, OP moist, no exudates, but red and some swelling NECK:  supple, no thyromegaly, +shotty nodes.  Fullness on R ant neck-nondiscript CARDIAC: RRR, S1S2+, no murmur.  LUNGS: CTAB. No wheezes EXT:  no edema MSK: no gross abnormalities.  NEURO: A&O x3.  CN II-XII intact.  PSYCH: normal mood. Good eye contact     Assessment & Plan:  Bronchitis  Other orders -     Azithromycin ; Take 2 tablets on day 1, then 1 tablet daily on days 2 through 5  Dispense: 6 tablet; Refill: 0 -     predniSONE ; Take 2 tablets (40 mg total) by mouth daily with breakfast for 5 days.  Dispense: 10 tablet; Refill: 0  Assessment and Plan    Upper Respiratory Infection Persistent symptoms since December 13 include sore throat, congestion, and hoarseness. Tests for COVID, flu, and strep are negative. Augmentin  provided partial relief. Throat examination shows significant redness and swelling. Differential diagnosis includes viral infection, walking pneumonia, and strep throat. Discussed the potential viral etiology, which would not  respond to antibiotics. Informed about the benefits of Z-Pak for covering walking pneumonia and strep, and the use of steroids to reduce inflammation. Risks include medication side effects and the possibility of a viral infection not responding to antibiotics. Prescribe Z-Pak (azithromycin ) and steroids. Advise hydration and rest.  Neck Nodule A palpable nodule on the right side of the neck is likely related to drainage and inflammation from the upper respiratory infection. It is movable and not fixed. The nodule may resolve with antibiotics  but requires monitoring. Monitor for changes and reassess after completing antibiotics.  Asthma Increased use of inhaler due to respiratory symptoms. Currently using Advair and albuterol  as needed. Emphasized the importance of continuing these medications to manage symptoms. Continue Advair and albuterol  as needed. Ensure the prescription for Advair is filled.  Anemia Current fatigue may be related to low ferritin levels. Ferritin levels were not checked today due to potential false elevation from the current illness. Checking ferritin levels now could give misleading results due to inflammation. Monitor ferritin levels after the resolution of the current illness.  Ovarian Cyst A recent discovery of an ovarian cyst was noted. No further details provided in this visit. Monitor symptoms and follow up as needed.  General Health Maintenance Advise staying hydrated and recommend rest and self-care.  Follow-up Follow up if symptoms persist or worsen. Monitor ferritin levels after illness resolves. Reassess neck nodule after antibiotics.        Return if symptoms worsen or fail to improve.  Ellsworth Haas, MD

## 2023-05-31 NOTE — Patient Instructions (Signed)
 Sent meds  Worse, no change let us  know

## 2023-06-14 ENCOUNTER — Encounter: Payer: Self-pay | Admitting: Family Medicine

## 2023-06-15 MED ORDER — FLUTICASONE FUROATE-VILANTEROL 200-25 MCG/ACT IN AEPB: 1.0000 | INHALATION_SPRAY | Freq: Every day | RESPIRATORY_TRACT | 11 refills | Status: DC

## 2023-06-17 ENCOUNTER — Other Ambulatory Visit: Payer: Self-pay | Admitting: Family Medicine

## 2023-07-05 ENCOUNTER — Encounter: Payer: Self-pay | Admitting: Hematology and Oncology

## 2023-07-06 ENCOUNTER — Ambulatory Visit: Payer: 59 | Admitting: Family Medicine

## 2023-07-06 ENCOUNTER — Encounter: Payer: Self-pay | Admitting: Family Medicine

## 2023-07-06 VITALS — BP 102/71 | HR 79 | Temp 97.3°F | Ht 61.0 in | Wt 193.6 lb

## 2023-07-06 DIAGNOSIS — M542 Cervicalgia: Secondary | ICD-10-CM | POA: Diagnosis not present

## 2023-07-06 NOTE — Progress Notes (Signed)
Phone [phone] In person visit   Subjective:   Amber Elliott is a 48 y.o. year old very pleasant female patient who presents for/with See problem oriented charting Chief Complaint  Patient presents with   Neck Pain    Soreness no changes since last visit    Past Medical History-  Patient Active Problem List   Diagnosis Date Noted   IDA (iron deficiency anemia) 09/29/2022    Priority: Medium    GAD (generalized anxiety disorder) 12/27/2021    Priority: Medium    Panic attacks 12/27/2021    Priority: Medium    Major depression in full remission (HCC) 11/12/2021    Priority: Medium    Attention deficit disorder (ADD) in adult 06/09/2014    Priority: Medium    Asthma 02/02/2007    Priority: Medium    Former smoker 10/06/2014    Priority: Low   Allergic rhinitis 02/02/2007    Priority: Low    Medications- reviewed and updated Current Outpatient Medications  Medication Sig Dispense Refill   albuterol (VENTOLIN HFA) 108 (90 Base) MCG/ACT inhaler INHALE 2 PUFFS BY MOUTH EVERY 6 HOURS AS NEEDED 8.5 g 1   cyclobenzaprine (FLEXERIL) 5 MG tablet Take 5-10 mg by mouth at bedtime as needed.     estradiol (VIVELLE-DOT) 0.0375 MG/24HR Place onto the skin.     fluticasone furoate-vilanterol (BREO ELLIPTA) 200-25 MCG/ACT AEPB Inhale 1 puff into the lungs daily. 1 each 11   hydrOXYzine (ATARAX) 50 MG tablet Take 1 tablet (50 mg total) by mouth 3 (three) times daily as needed. 90 tablet 2   LORazepam (ATIVAN) 0.5 MG tablet TAKE 1 TABLET(0.5 MG) BY MOUTH TWICE DAILY AS NEEDED FOR ANXIETY. DO NOT TAKE FOR 8 HOURS AFTER TAKING. BACKUP IF HYDROXYZINE NOT EFFECTIVE 15 tablet 0   Multiple Vitamin (MULTIVITAMIN) capsule Take 1 capsule by mouth daily.     tiZANidine (ZANAFLEX) 4 MG tablet      No current facility-administered medications for this visit.     Objective:  BP 102/71   Pulse 79   Temp (!) 97.3 F (36.3 C) (Temporal)   Ht 5\' 1"  (1.549 m)   Wt 193 lb 9.6 oz (87.8 kg)    LMP  (LMP Unknown)   SpO2 98%   BMI 36.58 kg/m  Gen: NAD, resting comfortably CV: RRR no murmurs rubs or gallops Neck musculature rather tight on right posterior.  Very mild tenderness without lymphadenopathy just behind angle of jaw Lungs: CTAB no crackles, wheeze, rhonchi Abdomen: soft/nontender/nondistended/normal bowel sounds. No rebound or guarding.  Ext: no edema Skin: warm, dry    Assessment and Plan   #ovarian cyst found in October 4.3 cm and now up 5.7 cm- monitoring right now considering removal. Ca 125 not elevated thankfully.   # neck discomfort S: Patient with illness dating back to mid December including sore throat, congestion, hoarseness.  We saw her in late December and treated with Augmentin with only partial improvement-later saw Dr. Ruthine Dose in mid January when January 15 and azithromycin was given to cover walking pneumonia as well as strep as well as prednisone to reduce inflammation -At visit on January 15 she had a palpable cervical lymph node-I had also noted this on the right side and December 30 visit  Despite feeling better from illness has had ongoing tenderness in the neck- along posterior neck musculature she feels tight but no discrete nodules- also has some similar discomfort at angle of jaw- occasionally there feels like may feel  nodule but not consistent.   A/P: on exam her posterior neck is very tight- suspect she may benefit from massage in that area. For the angle of the jaw- no cervical lymphadenopathy which was the main thing we wanted to rule out today- had warned her about persistence. She will let us know if either area doesn't improve in next month or so- for the posterior neck might consider sports medicine, jaw possibly ENT vs reevaluation.    #Weight management-patient found out that Ozempic might be covered with prior authorization but we discussed this would likely be for diabetes indication only.  We discussed benefits of medicines as well as  alternates like Zepbound or Wegovy which might be covered-she does not really needs her covered but will let me know if obtained more information  Recommended follow up: Return for next already scheduled visit or sooner if needed. Future Appointments  Date Time Provider Department Center  07/24/2023  2:45 PM [facility] None  07/27/2023  3:15 PM Serena Croissant, MD [facility] None  10/30/2023  9:00 AM Durene Cal Aldine Contes, MD [facility]    Lab/Order associations:   ICD-10-CM   1. Neck pain on right side  M54.2       Return precautions advised.  Tana Conch, MD

## 2023-07-06 NOTE — Patient Instructions (Addendum)
on exam her posterior neck is very tight- suspect she may benefit from massage in that area. For the angle of the jaw- no cervical lymphadenopathy which was the main thing we wanted to rule out today- had warned her about persistence. She will let us know if either area doesn't improve in next month or so- for the posterior neck might consider sports medicine, jaw possibly ENT vs reevaluation.   Recommended follow up: Return for next already scheduled visit or sooner if needed.

## 2023-07-17 ENCOUNTER — Encounter: Payer: Self-pay | Admitting: Family Medicine

## 2023-07-17 MED ORDER — OSELTAMIVIR PHOSPHATE 75 MG PO CAPS
75.0000 mg | ORAL_CAPSULE | Freq: Two times a day (BID) | ORAL | 0 refills | Status: DC
Start: 2023-07-17 — End: 2023-09-11

## 2023-07-17 NOTE — Telephone Encounter (Unsigned)
 Copied from CRM [id number]. Topic: Clinical - Medication Question >> Jul 17, 2023  2:45 PM Prudencio Pair wrote: Reason for CRM: Patient wants to know if Dr. Durene Cal can send in a prescription for TamiFlu? She states she tested positive for the flu today at [facility] and they were suppose to send over the results. Patient states she uploaded the results via MyChart. Please give her a call to advise if prescription can be sent.

## 2023-07-17 NOTE — Telephone Encounter (Signed)
 See below

## 2023-07-24 ENCOUNTER — Inpatient Hospital Stay: Payer: BC Managed Care – PPO | Attending: Hematology and Oncology

## 2023-07-24 DIAGNOSIS — D509 Iron deficiency anemia, unspecified: Secondary | ICD-10-CM | POA: Diagnosis present

## 2023-07-24 LAB — CBC WITH DIFFERENTIAL (CANCER CENTER ONLY)
Abs Immature Granulocytes: 0.03 10*3/uL (ref 0.00–0.07)
Basophils Absolute: 0 10*3/uL (ref 0.0–0.1)
Basophils Relative: 1 %
Eosinophils Absolute: 0.2 10*3/uL (ref 0.0–0.5)
Eosinophils Relative: 4 %
HCT: 40.4 % (ref 36.0–46.0)
Hemoglobin: 13.5 g/dL (ref 12.0–15.0)
Immature Granulocytes: 1 %
Lymphocytes Relative: 37 %
Lymphs Abs: 2.1 10*3/uL (ref 0.7–4.0)
MCH: 29.4 pg (ref 26.0–34.0)
MCHC: 33.4 g/dL (ref 30.0–36.0)
MCV: 88 fL (ref 80.0–100.0)
Monocytes Absolute: 0.3 10*3/uL (ref 0.1–1.0)
Monocytes Relative: 5 %
Neutro Abs: 3 10*3/uL (ref 1.7–7.7)
Neutrophils Relative %: 52 %
Platelet Count: 249 10*3/uL (ref 150–400)
RBC: 4.59 MIL/uL (ref 3.87–5.11)
RDW: 12.1 % (ref 11.5–15.5)
WBC Count: 5.6 10*3/uL (ref 4.0–10.5)
nRBC: 0 % (ref 0.0–0.2)

## 2023-07-24 LAB — IRON AND IRON BINDING CAPACITY (CC-WL,HP ONLY)
Iron: 75 ug/dL (ref 28–170)
Saturation Ratios: 19 % (ref 10.4–31.8)
TIBC: 389 ug/dL (ref 250–450)
UIBC: 314 ug/dL (ref 148–442)

## 2023-07-24 LAB — FERRITIN: Ferritin: 44 ng/mL (ref 11–307)

## 2023-07-24 LAB — VITAMIN B12: Vitamin B-12: 498 pg/mL (ref 180–914)

## 2023-07-26 NOTE — Assessment & Plan Note (Signed)
 Lab review: 08/29/2022: Hemoglobin 12.5, MCV 82.9, RDW 17.8, ferritin 6.3, iron saturation 17.9% 01/30/2023: Hemoglobin 14.1, MCV 88.9, iron saturation 22%, ferritin 49 04/18/2023: Hemoglobin 13.5, MCV 90.7, iron saturation 39%, ferritin 20 07/24/2023: Hemoglobin 13.5, MCV 88, iron saturation 19%, ferritin 44, B12 498   Iron deficiency without any significant anemia (severe fatigue) The fatigue has resolved after the iron infusion.   IV iron: May 2024 No indication for IV iron at this time. Patient had endoscopies capsule endoscopy and no evidence of bleeding was detected. Gyn evaluation: No active bleeding issues.  Ovarian cyst was detected. Therefore the cause of iron deficiency is felt to be malabsorption.  Recheck labs in 6 months and telephone visit after

## 2023-07-27 ENCOUNTER — Inpatient Hospital Stay (HOSPITAL_BASED_OUTPATIENT_CLINIC_OR_DEPARTMENT_OTHER): Payer: BC Managed Care – PPO | Admitting: Hematology and Oncology

## 2023-07-27 DIAGNOSIS — D509 Iron deficiency anemia, unspecified: Secondary | ICD-10-CM | POA: Diagnosis not present

## 2023-07-27 NOTE — Progress Notes (Signed)
 HEMATOLOGY-ONCOLOGY TELEPHONE VISIT PROGRESS NOTE  I connected with our patient on 07/27/23 at  3:15 PM EDT by telephone and verified that I am speaking with the correct person using two identifiers.  I discussed the limitations, risks, security and privacy concerns of performing an evaluation and management service by telephone and the availability of in person appointments.  I also discussed with the patient that there may be a patient responsible charge related to this service. The patient expressed understanding and agreed to proceed.   History of Present Illness: Follow-up of iron deficiency anemia  History of Present Illness The patient, with a history of anemia, presents for a follow-up visit after noticing significant improvement in her blood work from March tenth. She reports no issues around bleeding. She had a temporary absorption issue that seems to have resolved. Her last iron infusion was almost a year ago.     REVIEW OF SYSTEMS:   Constitutional: Denies fevers, chills or abnormal weight loss All other systems were reviewed with the patient and are negative. Observations/Objective:     Assessment Plan:  IDA (iron deficiency anemia) Lab review: 08/29/2022: Hemoglobin 12.5, MCV 82.9, RDW 17.8, ferritin 6.3, iron saturation 17.9% 01/30/2023: Hemoglobin 14.1, MCV 88.9, iron saturation 22%, ferritin 49 04/18/2023: Hemoglobin 13.5, MCV 90.7, iron saturation 39%, ferritin 20 07/24/2023: Hemoglobin 13.5, MCV 88, iron saturation 19%, ferritin 44, B12 498   Iron deficiency without any significant anemia (severe fatigue) The fatigue has resolved after the iron infusion.   IV iron: May 2024 No indication for IV iron at this time. Patient had endoscopies capsule endoscopy and no evidence of bleeding was detected. Gyn evaluation: No active bleeding issues.  Ovarian cyst was detected. Therefore the cause of iron deficiency is felt to be malabsorption.  However she has not required any  additional IV iron infusions since last year.  This seems to suggest that this may have been a transient event.  Return to clinic on an as-needed basis. Assessment & Plan Iron Deficiency Anemia Blood work indicates resolution of iron deficiency anemia. No signs of bleeding or anemia. Temporary absorption issue resolved. - Monitor hemoglobin and iron levels with primary care. - Contact hematology if anemia recurs.      I discussed the assessment and treatment plan with the patient. The patient was provided an opportunity to ask questions and all were answered. The patient agreed with the plan and demonstrated an understanding of the instructions. The patient was advised to call back or seek an in-person evaluation if the symptoms worsen or if the condition fails to improve as anticipated.   I provided 20 minutes of non-face-to-face time during this encounter.  This includes time for charting and coordination of care   Tamsen Meek, MD

## 2023-07-31 ENCOUNTER — Encounter: Payer: Self-pay | Admitting: Hematology and Oncology

## 2023-08-03 ENCOUNTER — Other Ambulatory Visit: Payer: Self-pay | Admitting: Family

## 2023-08-04 ENCOUNTER — Other Ambulatory Visit: Payer: Self-pay | Admitting: Family Medicine

## 2023-08-06 ENCOUNTER — Other Ambulatory Visit: Payer: Self-pay

## 2023-08-06 ENCOUNTER — Emergency Department (HOSPITAL_COMMUNITY)

## 2023-08-06 ENCOUNTER — Encounter (HOSPITAL_COMMUNITY): Payer: Self-pay | Admitting: Emergency Medicine

## 2023-08-06 ENCOUNTER — Emergency Department (HOSPITAL_COMMUNITY): Admission: EM | Admit: 2023-08-06 | Discharge: 2023-08-06 | Disposition: A | Discharge status: Home / Self Care

## 2023-08-06 DIAGNOSIS — N83201 Unspecified ovarian cyst, right side: Secondary | ICD-10-CM | POA: Insufficient documentation

## 2023-08-06 DIAGNOSIS — M545 Low back pain, unspecified: Secondary | ICD-10-CM | POA: Diagnosis present

## 2023-08-06 LAB — CBC
HCT: 44.4 % (ref 36.0–46.0)
Hemoglobin: 14.8 g/dL (ref 12.0–15.0)
MCH: 29.7 pg (ref 26.0–34.0)
MCHC: 33.3 g/dL (ref 30.0–36.0)
MCV: 89 fL (ref 80.0–100.0)
Platelets: 318 10*3/uL (ref 150–400)
RBC: 4.99 MIL/uL (ref 3.87–5.11)
RDW: 12.9 % (ref 11.5–15.5)
WBC: 7.2 10*3/uL (ref 4.0–10.5)
nRBC: 0 % (ref 0.0–0.2)

## 2023-08-06 LAB — COMPREHENSIVE METABOLIC PANEL
ALT: 15 U/L (ref 0–44)
AST: 25 U/L (ref 15–41)
Albumin: 4 g/dL (ref 3.5–5.0)
Alkaline Phosphatase: 51 U/L (ref 38–126)
Anion gap: 13 (ref 5–15)
BUN: 11 mg/dL (ref 6–20)
CO2: 21 mmol/L — ABNORMAL LOW (ref 22–32)
Calcium: 9.8 mg/dL (ref 8.9–10.3)
Chloride: 104 mmol/L (ref 98–111)
Creatinine, Ser: 0.86 mg/dL (ref 0.44–1.00)
GFR, Estimated: >60 mL/min (ref >60)
Glucose, Bld: 101 mg/dL — ABNORMAL HIGH (ref 70–99)
Potassium: 4.8 mmol/L (ref 3.5–5.1)
Sodium: 138 mmol/L (ref 135–145)
Total Bilirubin: 0.9 mg/dL (ref 0.0–1.2)
Total Protein: 7 g/dL (ref 6.5–8.1)

## 2023-08-06 LAB — HCG, SERUM, QUALITATIVE: Preg, Serum: NEGATIVE

## 2023-08-06 MED ORDER — NAPROXEN 375 MG PO TABS: 375.0000 mg | ORAL_TABLET | Freq: Two times a day (BID) | ORAL | 0 refills | Status: DC

## 2023-08-06 MED ORDER — IOHEXOL 350 MG/ML SOLN
75.0000 mL | Freq: Once | INTRAVENOUS | Status: AC | PRN
  Administered 2023-08-06: 75 mL via INTRAVENOUS

## 2023-08-06 MED ORDER — OXYCODONE-ACETAMINOPHEN 5-325 MG PO TABS: 1.0000 | ORAL_TABLET | Freq: Four times a day (QID) | ORAL | 0 refills | Status: DC | PRN

## 2023-08-06 MED ORDER — OXYCODONE-ACETAMINOPHEN 5-325 MG PO TABS
1.0000 | ORAL_TABLET | Freq: Once | ORAL | Status: AC
  Administered 2023-08-06: 1 via ORAL
  Filled 2023-08-06: qty 1

## 2023-08-06 MED ORDER — KETOROLAC TROMETHAMINE 15 MG/ML IJ SOLN
15.0000 mg | Freq: Once | INTRAMUSCULAR | Status: AC
Start: 2023-08-06 — End: 2023-08-06
  Administered 2023-08-06: 15 mg via INTRAVENOUS
  Filled 2023-08-06: qty 1

## 2023-08-06 NOTE — ED Notes (Signed)
 Patient transported to CT

## 2023-08-06 NOTE — ED Provider Notes (Signed)
 Patient was seen by Dr. Karie Schwalbe.  Plan was to follow-up in the patient's ultrasound.  Patient's ultrasound does not show any evidence of torsion.  There are 2 simple appearing cysts in the right ovary.  Patient does have a GYN doctor.  Recommend outpatient follow-up.  Discussed the incidental finding of the hiatal hernia which would not be related to her symptoms    Linwood Dibbles, MD 08/06/23 [phone]

## 2023-08-06 NOTE — ED Triage Notes (Signed)
 Pt came in for back pain.  Pt has hx of 5.7 cm ovarian cyst.  Nurse line suggested possible rupture. Sudden onset of 7/10 back pain at 3am.  Pt has had vaginal bleeding since yesterday AM, changing pads every 3-4 hrs.  Pt has hx of back problems but no obvious event suggesting injury this AM.

## 2023-08-06 NOTE — ED Provider Notes (Signed)
 [place] EMERGENCY DEPARTMENT AT [facility] Provider Note   CSN: [id number] Arrival date & time: 08/06/23  4098     History {Add pertinent medical, surgical, social history, OB history to HPI:1} Chief Complaint  Patient presents with   Back Pain    Amber Elliott is a 48 y.o. female.  48 year old female with past medical history of asthma and anemia in the past presenting to the emergency department today with right-sided back pain.  The patient states that she has had issues with her back in the past and this does feel somewhat similar.  She states that she is perimenopausal and has been on some hormone replacement therapy and has not had any periods now for the past few months.  She states that starting yesterday she had some cramping consistent with previous menstrual bleeding.  States that she is going through a pad every 3-4 hours which is not abnormal for her.  She states that she started with some right-sided flank pain last night.  She called the nurse line this morning and was told to come to the ER for further evaluation.  The patient denies any lightheadedness.  She reports normal bowel movements.  Denies any associated urinary symptoms.  She does have a history of an ovarian cyst and was told that the symptoms could be consistent with this.  The patient denies any bowel or bladder dysfunction and has not had any saddle anesthesia.   Back Pain      Home Medications Prior to Admission medications   Medication Sig Start Date End Date Taking? Authorizing Provider  albuterol (VENTOLIN HFA) 108 (90 Base) MCG/ACT inhaler INHALE 2 PUFFS BY MOUTH EVERY 6 HOURS AS NEEDED 06/19/23   Shelva Majestic, MD  cyclobenzaprine (FLEXERIL) 5 MG tablet Take 5-10 mg by mouth at bedtime as needed. 12/30/22   [provider]  estradiol (VIVELLE-DOT) 0.0375 MG/24HR Place onto the skin. 05/30/23   [provider]  fluticasone furoate-vilanterol (BREO ELLIPTA) 200-25  MCG/ACT AEPB Inhale 1 puff into the lungs daily. 06/15/23   Shelva Majestic, MD  hydrOXYzine (ATARAX) 50 MG tablet Take 1 tablet (50 mg total) by mouth 3 (three) times daily as needed. 11/14/22   Shelva Majestic, MD  LORazepam (ATIVAN) 0.5 MG tablet TAKE 1 TABLET(0.5 MG) BY MOUTH TWICE DAILY AS NEEDED FOR ANXIETY. DO NOT TAKE FOR 8 HOURS AFTER TAKING. BACKUP IF HYDROXYZINE NOT EFFECTIVE 03/15/23   Worthy Rancher B, FNP  Multiple Vitamin (MULTIVITAMIN) capsule Take 1 capsule by mouth daily.    [provider]  oseltamivir (TAMIFLU) 75 MG capsule Take 1 capsule (75 mg total) by mouth 2 (two) times daily. 07/17/23   Shelva Majestic, MD  tiZANidine (ZANAFLEX) 4 MG tablet     [provider]      Allergies    Patient has no known allergies.    Review of Systems   Review of Systems  Musculoskeletal:  Positive for back pain.  All other systems reviewed and are negative.   Physical Exam Updated Vital Signs BP 110/74   Pulse 94   Temp 97.6 F (36.4 C) (Oral)   Resp 16   Ht 5\' 1"  (1.549 m)   Wt 88 kg   SpO2 96%   BMI 36.66 kg/m  Physical Exam Vitals and nursing note reviewed.   Gen: NAD Eyes: PERRL, EOMI HEENT: no oropharyngeal swelling Neck: trachea midline Resp: clear to auscultation bilaterally Card: RRR, no murmurs, rubs, or gallops  Abd: R sided periumbilical tenderness noted without guarding or rebound MSK: R lower lumbar paraspinal tenderness noted Extremities: no calf tenderness, no edema Vascular: 2+ radial pulses bilaterally, 2+ DP pulses bilaterally Neuro: Equal strength and sensation throughout the bilateral lower extremities Skin: no rashes Psyc: acting appropriately   ED Results / Procedures / Treatments   Labs (all labs ordered are listed, but only abnormal results are displayed) Labs Reviewed  HCG, SERUM, QUALITATIVE  CBC    EKG None  Radiology No results found.  Procedures Procedures  {Document cardiac monitor, telemetry  assessment procedure when appropriate:1}  Medications Ordered in ED Medications - No data to display  ED Course/ Medical Decision Making/ A&P   {   Click here for ABCD2, HEART and other calculatorsREFRESH Note before signing :1}                              Medical Decision Making 48 year old female with past medical history of asthma and anorexia who is also perimenopausal presenting to the emergency department today with vaginal bleeding as well as some right-sided flank pain.  I will further evaluate the patient here with basic labs Wels a CMP for further evaluation for anemia or electrolyte abnormalities.  Will obtain CT scan to evaluate for ruptured ovarian cyst, ureterolithiasis, appendicitis, diverticulitis, or other intra-abdominal pathology.  Pelvic exam was discussed with the patient but since this is consistent with her infrequent menses she is deferring at this time.  The patient's labs are reassuring.  Her CT scan did show possible fibroids and a cyst and ultrasound is recommended.  A transvaginal percent was ordered.  The patient not had any further bleeding while here.  Plan is for reevaluation after her ultrasound for ultimate disposition.  Amount and/or Complexity of Data Reviewed Labs: ordered. Radiology: ordered.  Risk Prescription drug management.   ***  {Document critical care time when appropriate:1} {Document review of labs and clinical decision tools ie heart score, Chads2Vasc2 etc:1}  {Document your independent review of radiology images, and any outside records:1} {Document your discussion with family members, caretakers, and with consultants:1} {Document social determinants of health affecting pt's care:1} {Document your decision making why or why not admission, treatments were needed:1} Final Clinical Impression(s) / ED Diagnoses Final diagnoses:  None    Rx / DC Orders ED Discharge Orders     None

## 2023-08-06 NOTE — Discharge Instructions (Addendum)
 Take the medications as needed for pain and discomfort.  Follow-up with your OB/GYN doctor for further evaluation of the ovarian cysts.

## 2023-08-06 NOTE — ED Notes (Signed)
 Pt ambulated to restroom.

## 2023-08-07 MED ORDER — LORAZEPAM 0.5 MG PO TABS: ORAL_TABLET | ORAL | 1 refills | Status: AC

## 2023-08-18 ENCOUNTER — Telehealth: Payer: Self-pay | Admitting: *Deleted

## 2023-08-18 NOTE — Telephone Encounter (Signed)
 Spoke with the patient regarding the referral to GYN oncology. Patient scheduled as new patient with Dr Alvester Morin on 4/28 at 9 am. Patient given an arrival time of 8:30 am.  Explained to the patient the the doctor will perform a pelvic exam at this visit. Patient given the policy that only one visitor allowed and that visitor must be over 16 yrs are allowed in the Cancer Center. Patient given the address/phone number for the clinic and that the center offers free valet service. Patient aware that masks required.

## 2023-08-30 ENCOUNTER — Telehealth: Payer: Self-pay

## 2023-08-30 ENCOUNTER — Other Ambulatory Visit: Payer: Self-pay | Admitting: Family Medicine

## 2023-08-30 NOTE — Telephone Encounter (Signed)
 Copied from CRM [id number]. Topic: Clinical - Prescription Issue >> Aug 30, 2023 10:05 AM Star [place] wrote: Reason for CRM: FLUoxetine (PROZAC) 10 MG capsule- request for refill was denied- please call patient [phone]

## 2023-09-01 ENCOUNTER — Other Ambulatory Visit: Payer: Self-pay | Admitting: Family Medicine

## 2023-09-11 ENCOUNTER — Inpatient Hospital Stay: Attending: Hematology and Oncology | Admitting: Psychiatry

## 2023-09-11 ENCOUNTER — Encounter: Payer: Self-pay | Admitting: Psychiatry

## 2023-09-11 VITALS — BP 128/73 | HR 88 | Temp 97.9°F | Resp 17 | Ht 61.0 in | Wt 200.6 lb

## 2023-09-11 DIAGNOSIS — Z800 Family history of malignant neoplasm of digestive organs: Secondary | ICD-10-CM | POA: Insufficient documentation

## 2023-09-11 DIAGNOSIS — D259 Leiomyoma of uterus, unspecified: Secondary | ICD-10-CM | POA: Insufficient documentation

## 2023-09-11 DIAGNOSIS — N939 Abnormal uterine and vaginal bleeding, unspecified: Secondary | ICD-10-CM | POA: Insufficient documentation

## 2023-09-11 DIAGNOSIS — F419 Anxiety disorder, unspecified: Secondary | ICD-10-CM | POA: Diagnosis not present

## 2023-09-11 DIAGNOSIS — Z6837 Body mass index (BMI) 37.0-37.9, adult: Secondary | ICD-10-CM | POA: Diagnosis not present

## 2023-09-11 DIAGNOSIS — F32A Depression, unspecified: Secondary | ICD-10-CM | POA: Diagnosis not present

## 2023-09-11 DIAGNOSIS — K219 Gastro-esophageal reflux disease without esophagitis: Secondary | ICD-10-CM | POA: Insufficient documentation

## 2023-09-11 DIAGNOSIS — Z87891 Personal history of nicotine dependence: Secondary | ICD-10-CM | POA: Insufficient documentation

## 2023-09-11 DIAGNOSIS — Z8049 Family history of malignant neoplasm of other genital organs: Secondary | ICD-10-CM | POA: Insufficient documentation

## 2023-09-11 DIAGNOSIS — N83201 Unspecified ovarian cyst, right side: Secondary | ICD-10-CM | POA: Diagnosis present

## 2023-09-11 DIAGNOSIS — J45909 Unspecified asthma, uncomplicated: Secondary | ICD-10-CM | POA: Insufficient documentation

## 2023-09-11 NOTE — Patient Instructions (Signed)
 It was a pleasure to see you in clinic today. - We discussed that your workup has been reassuring. Low concern for a cancer or pre-cancer process - Discussed that it would be reasonable to consider removing the ovary or cyst given size and RLQ intermittent pain. Could consider hysterectomy given bleeding, fibroids, but not imperative pending wishes.  - Will send not to your gynecologist. Reach out if any other questions or concerns.   Thank you very much for allowing me to provide care for you today.  I appreciate your confidence in choosing our Gynecologic Oncology team at [facility].  If you have any questions about your visit today please call our office or send us  a MyChart message and we will get back to you as soon as possible.

## 2023-09-11 NOTE — Progress Notes (Signed)
 GYNECOLOGIC ONCOLOGY NEW PATIENT CONSULTATION  Date of Service: 09/11/2023 Referring Provider: Meldon Sport, DO   ASSESSMENT AND PLAN: Amber Elliott is a 48 y.o. woman with a 5.5 cm right ovarian cystic mass with thin septation (versus 2 adjacent simple appearing cysts) with normal tumor markers and occasional right lower quadrant pain.  Perimenopausal with irregular bleeding with benign endometrial biopsy and ultrasound with a few small uterine fibroids.  Reviewed workup to date. On review of ultrasound, cystic area overall appears benign.  Also reassuring given normal CA125 and premenopausal Roma low risk. Additionally, has had a benign endometrial biopsy.  Bleeding pattern is likely in the setting of perimenopausal changes and may be impacted by her small uterine fibroids.  Given the size of the cystic lesion and her intermittent right lower quadrant pains, discussed that it be reasonable to consider surgical removal.  This could be with either a cystectomy or oophorectomy given age.  Given overall benign appearance and reassuring workup, this could be considered with her gynecologist.  Additionally, discussed pros and cons of concurrent hysterectomy.  Do not feel that this is required but would be reasonable in the setting of her AUB and fibroids and prior need for IV iron  infusions for iron  deficiency anemia in the setting of heavy menstrual bleeding.  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  Most recent Pap smear with ASCUS but HPV high-risk negative which is reassuring.  Will recommend that patient follow-up with her OB/GYN discuss further at this time.  Pending that discussion, happy to see her back if her provider has any additional questions or concerns or would feel more comfortable with us  performing her surgical intervention.  However, feel that patient would be appropriate for benign gynecology at this time.  A copy of this note  was sent to the patient's referring provider.  Derrel Flies, MD Gynecologic Oncology   Medical Decision Making I personally spent  TOTAL 57 minutes face-to-face and non-face-to-face in the care of this patient, which includes all pre, intra, and post visit time on the date of service.   ------------  CC: Ovarian cyst  HISTORY OF PRESENT ILLNESS:  Amber Elliott is a 48 y.o. woman who is seen in consultation at the request of Meldon Sport, DO for evaluation of ovarian cyst.  Patient was seen by OB/GYN on 03/13/2023 for pelvic and back pain.  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  She reported a history of longstanding heavy menses and iron  deficiency anemia for which she follows with hematology at [facility].  Most recently her hemoglobin was 14 after iron  infusion.  An endometrial biopsy was collected for the evaluation of AUB and returned with atrophic endometrium.  She underwent a pelvic ultrasound on 04/11/2023 which showed small uterine fibroids measuring 2.5 cm and 0.9 cm and a complex right ovarian cyst with multiple thin septations measuring 4.3 x 3.9 x 2.8 cm.  She underwent blood work within normal CA125 of 7.1 and a low risk premenopausal Roma of 1.09.  Subsequent ultrasound on 06/12/2023 showed slight enlargement of the complex cyst measuring 5.7 x 4.6 x 3.3 cm.  Another ultrasound was completed on 07/25/2023 with overall stable size.  She then presented to the emergency room on 08/06/2023 for right sided back pain.  A CT abdomen/pelvis was completed which showed a septated right adnexal cystic mass measuring 5.5 x 3.3 cm.  No lymphadenopathy and trace  pelvic free fluid.  An ultrasound at that time showed 2 adjacent simple appearing right ovarian cyst measuring up to 3.1 cm likely physiologic.  Pt requested surgical consult.  Today patient reports that she has been experiencing random right lower quadrant pains since probably around September.  The pain comes  and goes and may last for few seconds or minutes.  She may have a cluster of episodes on a day and then could go 2 weeks without an episode.  The pain is as she describes "super random" and its occurrences.  She otherwise notes that she has been experiencing back pain with degenerative disc since 2021 or 2022.  In terms of her menstrual cycles, she had a cycle in September and then in March.  The one in March lasted 4 to 5 days.  She wears pads and had to change them every 3 hours.  Prior to September, her periods were becoming more spaced out, sometimes every other month.  She otherwise denies abdominal bloating, early satiety, significant weight loss, change in bowel or bladder habits.    PAST MEDICAL HISTORY: Past Medical History:  Diagnosis Date   ALLERGIC RHINITIS 02/02/2007   Allergy    Anemia    slight    Anxiety 08/2021   ASTHMA 02/02/2007   Asthma    ATTENTION DEFICIT DISORDER, ADULT 05/14/2007   Depression 08/2021   GERD 05/14/2007   symptoms resolved when became vegetarian   IDA (iron  deficiency anemia)    PONV (postoperative nausea and vomiting)    Sessile serrated polyp of colon     PAST SURGICAL HISTORY: Past Surgical History:  Procedure Laterality Date   BREAST REDUCTION SURGERY     age 6- 6 lbs of tissue   EYE SURGERY     esotropia   LEEP     89-no abnormal paps since   REDUCTION MAMMAPLASTY Bilateral    @ age 46   TONSILLECTOMY     49 or 56    OB/GYN HISTORY: OB History  Gravida Para Term Preterm AB Living  1 0 0 0 1 0  SAB IAB Ectopic Multiple Live Births  0 0 0 0 0    # Outcome Date GA Lbr Len/2nd Weight Sex Type Anes PTL Lv  1 AB               Age at menarche: 30 Age at menopause: n/a Hx of HRT: estradiol , progesterone  (perimenopausal since summer 2024) Hx of STI: no Last pap: 03/13/23 ASCUS HPV HR neg History of abnormal pap smears: h/o CIN3, s/p LEEP 1997, normal after that  SCREENING STUDIES:  Last mammogram: 05/2023 Last  colonoscopy: 2024  MEDICATIONS:  Current Outpatient Medications:    albuterol  (VENTOLIN  HFA) 108 (90 Base) MCG/ACT inhaler, INHALE 2 PUFFS BY MOUTH EVERY 6 HOURS AS NEEDED, Disp: 8.5 g, Rfl: 1   cyclobenzaprine (FLEXERIL) 5 MG tablet, Take 5-10 mg by mouth at bedtime as needed., Disp: , Rfl:    estradiol  (VIVELLE -DOT) 0.0375 MG/24HR, Place onto the skin., Disp: , Rfl:    fluticasone  furoate-vilanterol (BREO ELLIPTA ) 200-25 MCG/ACT AEPB, Inhale 1 puff into the lungs daily., Disp: 1 each, Rfl: 11   hydrOXYzine  (ATARAX ) 50 MG tablet, Take 1 tablet (50 mg total) by mouth 3 (three) times daily as needed., Disp: 90 tablet, Rfl: 2   LORazepam  (ATIVAN ) 0.5 MG tablet, TAKE 1 TABLET(0.5 MG) BY MOUTH TWICE DAILY AS NEEDED FOR ANXIETY. DO NOT drive FOR 8 HOURS AFTER TAKING. BACKUP IF HYDROXYZINE  NOT  EFFECTIVE, Disp: 15 tablet, Rfl: 1   progesterone  (PROMETRIUM ) 100 MG capsule, Take 100 mg by mouth at bedtime., Disp: , Rfl:    tiZANidine (ZANAFLEX) 4 MG tablet, , Disp: , Rfl:   ALLERGIES: No Known Allergies  FAMILY HISTORY: Family History  Problem Relation Age of Onset   Arthritis Mother    AAA (abdominal aortic aneurysm) Mother        s/p repair in 35s   Other Mother        atherosclerosis   Hearing loss Mother    Stomach cancer Father        73s. led to his death at 87.    Other Father        menetrier disease- massive gastric folds   Alcohol abuse Father    Heart failure Maternal Grandmother        26 diagnosed with this   Colon polyps Maternal Grandmother    Alcohol abuse Paternal Grandmother    Diabetes Half-Brother    Other Half-Brother        unknown cause of death   Healthy Half-Brother    Healthy Half-Brother    Drug abuse Half-Sister        long term issues    Endometrial cancer Maternal Aunt    Colon cancer Neg Hx    Esophageal cancer Neg Hx    Rectal cancer Neg Hx    BRCA 1/2 Neg Hx    Breast cancer Neg Hx    Ovarian cancer Neg Hx     SOCIAL HISTORY: Social History    Socioeconomic History   Marital status: Single    Spouse name: Not on file   Number of children: Not on file   Years of education: Not on file   Highest education level: Master's degree (e.g., MA, MS, MEng, MEd, MSW, MBA)  Occupational History   Not on file  Tobacco Use   Smoking status: Former    Current packs/day: 0.00    Average packs/day: 0.8 packs/day for 10.0 years (7.5 ttl pk-yrs)    Types: Cigarettes    Start date: 05/16/1989    Quit date: 05/17/1999    Years since quitting: 24.3   Smokeless tobacco: Never  Vaping Use   Vaping status: Never Used  Substance and Sexual Activity   Alcohol use: Yes    Comment: I drink wine at special occassions   Drug use: No   Sexual activity: Not Currently    Birth control/protection: Condom  Other Topics Concern   Not on file  Social History Narrative   Single- lives alone but close to mom. No children.       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       Hobbies: reading, walks   Social Drivers of Corporate investment banker Strain: Low Risk  (05/15/2023)   Overall Financial Resource Strain (CARDIA)    Difficulty of Paying Living Expenses: Not hard at all  Food Insecurity: No Food Insecurity (05/15/2023)   Hunger Vital Sign    Worried About Running Out of Food in the Last Year: Never true    Ran Out of Food in the Last Year: Never true  Transportation Needs: No Transportation Needs (05/15/2023)   PRAPARE - Transportation  Lack of Transportation (Medical): No    Lack of Transportation (Non-Medical): No  Physical Activity: Insufficiently Active (05/15/2023)   Exercise Vital Sign    Days of Exercise per Week: 2 days    Minutes of Exercise per Session: 30 min  Stress: No Stress Concern  Present (05/15/2023)   [employer] of Occupational Health - Occupational Stress Questionnaire    Feeling of Stress : Only a little  Recent Concern: Stress - Stress Concern Present (03/03/2023)   [employer] of Occupational Health - Occupational Stress Questionnaire    Feeling of Stress : To some extent  Social Connections: Socially Isolated (05/15/2023)   Social Connection and Isolation Panel [NHANES]    Frequency of Communication with Friends and Family: More than three times a week    Frequency of Social Gatherings with Friends and Family: Twice a week    Attends Religious Services: Never    Database administrator or Organizations: No    Attends Engineer, structural: Not on file    Marital Status: Never married  Intimate Partner Violence: Not on file    REVIEW OF SYSTEMS: New patient intake form was reviewed.  Complete 10-system review is negative except for the following: Menstrual problems, pelvic pain, back pain  PHYSICAL EXAM: BP 128/73 (BP Location: Left Arm, Patient Position: Sitting)   Pulse 88   Temp 97.9 F (36.6 C) (Oral)   Resp 17   Ht 5\' 1"  (1.549 m)   Wt 200 lb 9.6 oz (91 kg)   SpO2 99%   BMI 37.90 kg/m  Constitutional: No acute distress. Neuro/Psych: Alert, oriented.  Head and Neck: Normocephalic, atraumatic. Neck symmetric without masses. Sclera anicteric.  Respiratory: Normal work of breathing. Clear to auscultation bilaterally. Cardiovascular: Regular rate and rhythm, no murmurs, rubs, or gallops. Abdomen: Normoactive bowel sounds.Soft, non-distended, non-tender to palpation. No masses appreciated.  Extremities: Grossly normal range of motion. Warm, well perfused. No edema bilaterally. Skin: No rashes or lesions. Lymphatic: No cervical, supraclavicular, or inguinal adenopathy. Genitourinary: External genitalia without lesions. Urethral meatus without lesions or prolapse. On speculum exam, vagina and cervix without lesions. Bimanual exam  reveals normal cervix and uterus, mobile.  No left adnexal mass.  Right ovarian cyst mobile, evaluation limited by body habitus. Exam chaperoned by Vira Grieves, NP   LABORATORY AND RADIOLOGIC DATA: Outside medical records were reviewed to synthesize the above history, along with the history and physical obtained during the visit.  Outside laboratory, pathology, and imaging reports were reviewed, with pertinent results below.  I personally reviewed the outside images.  WBC  Date Value Ref Range Status  08/06/2023 7.2 4.0 - 10.5 K/uL Final   Hemoglobin  Date Value Ref Range Status  08/06/2023 14.8 12.0 - 15.0 g/dL Final  45/40/9811 91.4 12.0 - 15.0 g/dL Final   HCT  Date Value Ref Range Status  08/06/2023 44.4 36.0 - 46.0 % Final   Platelets  Date Value Ref Range Status  08/06/2023 318 150 - 400 K/uL Final   Platelet Count  Date Value Ref Range Status  07/24/2023 249 150 - 400 K/uL Final   Creatinine  Date Value Ref Range Status  01/30/2023 0.88 0.44 - 1.00 mg/dL Final   Creatinine, Ser  Date Value Ref Range Status  08/06/2023 0.86 0.44 - 1.00 mg/dL Final   AST  Date Value Ref Range Status  08/06/2023 25 15 - 41 U/L Final    Comment:    HEMOLYSIS AT THIS LEVEL MAY AFFECT RESULT  01/30/2023 16 15 - 41 U/L Final   ALT  Date Value Ref Range Status  08/06/2023 15 0 - 44 U/L Final    Comment:    HEMOLYSIS AT THIS LEVEL MAY AFFECT RESULT  01/30/2023 10 0 - 44 U/L Final   Labs (04/11/23): CA125: 7.1 HE4: 60.7 Premenopausal ROMA: low risk  Surgical pathology (03/13/23): Endometrial biopsy: atrophic endometrium  US  PELVIC COMPLETE W TRANSVAGINAL AND TORSION R/O 08/06/2023  Narrative CLINICAL DATA:  Right adnexal cyst  EXAM: TRANSABDOMINAL AND TRANSVAGINAL ULTRASOUND OF PELVIS  DOPPLER ULTRASOUND OF OVARIES  TECHNIQUE: Both transabdominal and transvaginal ultrasound examinations of the pelvis were performed. Transabdominal technique was performed for global  imaging of the pelvis including uterus, ovaries, adnexal regions, and pelvic cul-de-sac.  It was necessary to proceed with endovaginal exam following the transabdominal exam to visualize the endometrium and right ovary. Color and duplex Doppler ultrasound was utilized to evaluate blood flow to the ovaries.  COMPARISON:  Earlier same day CT abdomen and pelvis  FINDINGS: Uterus  Measurements: 7.6 cm in sagittal dimension. Heterogeneously hypoechoic submucosal mass within the right uterine body measures 2.7 x 2.4 x 2.4 cm and demonstrates proximally 25% submucosal component.  Endometrium  Thickness: 7 mm.  No focal abnormality visualized.  Right ovary  Measurements: 5.6 x 3.5 x 2.8 cm = volume: 28.3 mL. Contains 2 adjacent simple-appearing cysts measuring up to 3.1 x 3.1 x 2.8 cm.  Left ovary  Not seen.  Pulsed Doppler evaluation of the right ovary demonstrates normal low-resistance arterial and venous waveforms.  Other findings  Small volume pelvic free fluid adjacent to the right adnexa.  IMPRESSION: 1. No sonographic finding of right ovarian torsion. Two adjacent simple-appearing right ovarian cysts measuring up to 3.1 cm, likely physiologic, however their combined size may result in lead point in torsion. Recommend evaluation with Doppler ultrasound examination if there is acute onset of pelvic pain. 2. Left ovary is not seen. 3. Heterogeneously hypoechoic submucosal mass within the right uterine body measures 2.7 cm and demonstrates proximally 25% submucosal component, likely a leiomyoma. 4. Small volume pelvic free fluid adjacent to the right adnexa, likely physiologic.   Electronically Signed By: Limin  Xu M.D. On: 08/06/2023 15:32   CT ABDOMEN PELVIS W CONTRAST 08/06/2023  Narrative CLINICAL DATA:  History of ovarian cyst with acute onset back pain  EXAM: CT ABDOMEN AND PELVIS WITH CONTRAST  TECHNIQUE: Multidetector CT imaging of the abdomen and  pelvis was performed using the standard protocol following bolus administration of intravenous contrast.  RADIATION DOSE REDUCTION: This exam was performed according to the departmental dose-optimization program which includes automated exposure control, adjustment of the mA and/or kV according to patient size and/or use of iterative reconstruction technique.  CONTRAST:  75mL OMNIPAQUE  IOHEXOL  350 MG/ML SOLN  COMPARISON:  None Available.  FINDINGS: Lower chest: No focal consolidation or pulmonary nodule in the lung bases. No pleural effusion or pneumothorax demonstrated. Partially imaged heart size is normal.  Hepatobiliary: Scattered subcentimeter hypodensities, too small to characterize, likely cysts. No intra or extrahepatic biliary ductal dilation. Normal gallbladder.  Pancreas: No focal lesions or main ductal dilation.  Spleen: Normal in size without focal abnormality.  Adrenals/Urinary Tract: No adrenal nodules. Duplex left kidney. No suspicious renal mass, calculi or hydronephrosis. No focal bladder wall thickening.  Stomach/Bowel: Small hiatal hernia. Normal appearance of the stomach. No evidence of bowel wall thickening, distention, or inflammatory changes. Normal appendix.  Vascular/Lymphatic: No significant vascular findings are present. No enlarged abdominal  or pelvic lymph nodes.  Reproductive: Leftward deviation of the endometrium suggest underlying mass in the right uterine fundus, likely leiomyoma. No left adnexal mass. Septated/cluster of right adnexal cysts measuring up to 5.5 x 3.3 cm (3:60).  Other: Trace pelvic free fluid.  No free air or fluid collection.  Musculoskeletal: No acute or abnormal lytic or blastic osseous lesions. Multilevel degenerative changes of the partially imaged thoracic and lumbar spine. Limbus vertebrae with minimal focal kyphosis at the level of L2-3.  IMPRESSION: 1. Septated/cluster of right adnexal cysts measuring up to  5.5 x 3.3 cm, incompletely characterized. Recommend further evaluation with pelvic ultrasound examination. 2. Leftward deviation of the endometrium suggest underlying mass in the right uterine fundus, likely leiomyoma. This finding can be evaluated at the same time on above recommended pelvic ultrasound examination. 3. Small hiatal hernia.   Electronically Signed By: Limin  Xu M.D. On: 08/06/2023 13:40

## 2023-09-12 ENCOUNTER — Other Ambulatory Visit: Payer: Self-pay

## 2023-09-12 MED ORDER — FLUOXETINE HCL 10 MG PO CAPS: 10.0000 mg | ORAL_CAPSULE | Freq: Every day | ORAL | 1 refills | Status: DC

## 2023-09-14 ENCOUNTER — Encounter: Payer: Self-pay | Admitting: Obstetrics and Gynecology

## 2023-10-26 ENCOUNTER — Ambulatory Visit: Payer: Self-pay

## 2023-10-26 ENCOUNTER — Telehealth: Payer: Self-pay

## 2023-10-26 NOTE — Telephone Encounter (Signed)
 See below.   Copied from CRM [id number]. Topic: General - Other >> Oct 26, 2023 11:31 AM Aisha D wrote: Reason for CRM: Pt stated that her resting heart rate was in between 120-130 which caused her to have a panic attach. Pt stated that she took some LORazepam  and her heart rate went down to the 100's and now is back elevated at 115.Pt wants to know if she needs to make an appt to come into the office or can she wait until her physical appt on Monday 6/16.

## 2023-10-26 NOTE — Telephone Encounter (Signed)
 Would recommend ASAP evaluation-to get EKG make sure there is not a new significant underlying rhythm.  If chest pain or shortness of breath or dizziness seek care immediately in the emergency department

## 2023-10-26 NOTE — Telephone Encounter (Signed)
 Copied from CRM [id number]. Topic: Clinical - Red Word Triage >> Oct 26, 2023  3:05 PM Martinique E wrote: [facility] that prompted transfer to Nurse Triage: Increased heart rate. Patient stated her heart rate today has been between 120-130, which lead to some panic. Patient also experiencing a little SOB, could be related to cold symptoms. Reason for Disposition  [1] Palpitations AND [2] no improvement after using Care Advice  Answer Assessment - Initial Assessment Questions 1. DESCRIPTION: Please describe your heart rate or heartbeat that you are having (e.g., fast/slow, regular/irregular, skipped or extra beats, palpitations)     Beating regular but is fast. Ranges from 120-130 2. ONSET: When did it start? (Minutes, hours or days)      This morning  3. DURATION: How long does it last (e.g., seconds, minutes, hours)     All day  4. PATTERN Does it come and go, or has it been constant since it started?  Does it get worse with exertion?   Are you feeling it now?     Constant  5. TAP: Using your hand, can you tap out what you are feeling on a chair or table in front of you, so that I can hear? (Note: not all patients can do this)       Regular  6. HEART RATE: Can you tell me your heart rate? How many beats in 15 seconds?  (Note: not all patients can do this)       127 7. RECURRENT SYMPTOM: Have you ever had this before? If Yes, ask: When was the last time? and What happened that time?      Na  8. CAUSE: What do you think is causing the palpitations?     Not sure  9. CARDIAC HISTORY: Do you have any history of heart disease? (e.g., heart attack, angina, bypass surgery, angioplasty, arrhythmia)      Denis  10. OTHER SYMPTOMS: Do you have any other symptoms? (e.g., dizziness, chest pain, sweating, difficulty breathing)       Cough     Pt been experiencing cold/cough. Not much sleep due to being sick. Pt does have anxiety and made it worse. Pt took ativan . HR  dropped to 100. Anxiety is better. BP is 135/80 this afternoon. Chest feels like asthma attack-tightness feeling. Denies SOB or weakness.  BP checked again 124/97; HR 117. RN advised pt to go to UC today. Pt has physical with PCP on 6/16. PT stated she was going to UC.  Protocols used: Heart Rate and Heartbeat Questions-A-AH

## 2023-10-26 NOTE — Telephone Encounter (Signed)
 See below, Ppease schedule asap with available provider.

## 2023-10-26 NOTE — Telephone Encounter (Signed)
LVM informing pt of PCP message below.

## 2023-10-30 ENCOUNTER — Encounter: Payer: Self-pay | Admitting: Family Medicine

## 2023-10-30 ENCOUNTER — Ambulatory Visit (INDEPENDENT_AMBULATORY_CARE_PROVIDER_SITE_OTHER): Payer: BC Managed Care – PPO | Admitting: Family Medicine

## 2023-10-30 VITALS — BP 110/82 | HR 80 | Temp 97.3°F | Ht 61.0 in | Wt 201.4 lb

## 2023-10-30 DIAGNOSIS — Z87891 Personal history of nicotine dependence: Secondary | ICD-10-CM

## 2023-10-30 DIAGNOSIS — Z23 Encounter for immunization: Secondary | ICD-10-CM

## 2023-10-30 DIAGNOSIS — F411 Generalized anxiety disorder: Secondary | ICD-10-CM

## 2023-10-30 DIAGNOSIS — Z0000 Encounter for general adult medical examination without abnormal findings: Secondary | ICD-10-CM

## 2023-10-30 DIAGNOSIS — D509 Iron deficiency anemia, unspecified: Secondary | ICD-10-CM | POA: Diagnosis not present

## 2023-10-30 DIAGNOSIS — E785 Hyperlipidemia, unspecified: Secondary | ICD-10-CM

## 2023-10-30 DIAGNOSIS — Z131 Encounter for screening for diabetes mellitus: Secondary | ICD-10-CM

## 2023-10-30 DIAGNOSIS — E669 Obesity, unspecified: Secondary | ICD-10-CM

## 2023-10-30 MED ORDER — BREO ELLIPTA 200-25 MCG/ACT IN AEPB: 1.0000 | INHALATION_SPRAY | Freq: Every day | RESPIRATORY_TRACT | 11 refills | Status: AC

## 2023-10-30 NOTE — Patient Instructions (Addendum)
 Please stop by lab before you go If you have mychart- we will send your results within 3 business days of us  receiving them.  If you do not have mychart- we will call you about results within 5 business days of us  receiving them.  *please also note that you will see labs on mychart as soon as they post. I will later go in and write notes on them- will say notes from Dr. Gracia Lauth of luck with your surgery!   Recommended follow up: Return in about 6 months (around 04/30/2024) for followup or sooner if needed.Schedule b4 you leave.

## 2023-10-30 NOTE — Progress Notes (Signed)
 Phone [phone]   Subjective:  Patient presents today for their annual physical. Chief complaint-noted.   See problem oriented charting- ROS- full  review of systems was completed and negative Per full ROS sheet completed by patient except for topics noted under acute/chronic concerns   The following were reviewed and entered/updated in epic: Past Medical History:  Diagnosis Date   ALLERGIC RHINITIS 02/02/2007   Allergy    Since childhood   Anemia 08/2021   slight    Anxiety 08/2021   ASTHMA 02/02/2007   Asthma    Since childhood   ATTENTION DEFICIT DISORDER, ADULT 05/14/2007   Depression 08/2021   GERD 05/14/2007   symptoms resolved when became vegetarian   IDA (iron  deficiency anemia)    PONV (postoperative nausea and vomiting)    Sessile serrated polyp of colon    Patient Active Problem List   Diagnosis Date Noted   IDA (iron  deficiency anemia) 09/29/2022    Priority: Medium    GAD (generalized anxiety disorder) 12/27/2021    Priority: Medium    Panic attacks 12/27/2021    Priority: Medium    Major depression in full remission (HCC) 11/12/2021    Priority: Medium    Attention deficit disorder (ADD) in adult 06/09/2014    Priority: Medium    Asthma 02/02/2007    Priority: Medium    Former smoker 10/06/2014    Priority: Low   Allergic rhinitis 02/02/2007    Priority: Low   Past Surgical History:  Procedure Laterality Date   BREAST REDUCTION SURGERY     age 89- 6 lbs of tissue   EYE SURGERY     esotropia   LEEP     89-no abnormal paps since   REDUCTION MAMMAPLASTY Bilateral    @ age 42   TONSILLECTOMY     24 or 55    Family History  Problem Relation Age of Onset   Arthritis Mother    AAA (abdominal aortic aneurysm) Mother        s/p repair in 54s   Other Mother        atherosclerosis   Hearing loss Mother    Stomach cancer Father        21s. led to his death at 23.    Other Father        menetrier disease- massive gastric folds    Alcohol abuse Father    Cancer Father    Heart failure Maternal Grandmother        63 diagnosed with this   Colon polyps Maternal Grandmother    Alcohol abuse Paternal Grandmother    Diabetes Half-Brother    Other Half-Brother        unknown cause of death   Healthy Half-Brother    Healthy Half-Brother    Drug abuse Half-Sister        long term issues    Endometrial cancer Maternal Aunt    Colon cancer Neg Hx    Esophageal cancer Neg Hx    Rectal cancer Neg Hx    BRCA 1/2 Neg Hx    Breast cancer Neg Hx    Ovarian cancer Neg Hx     Medications- reviewed and updated Current Outpatient Medications  Medication Sig Dispense Refill   estradiol  (VIVELLE -DOT) 0.0375 MG/24HR Place onto the skin.     hydrOXYzine  (ATARAX ) 50 MG tablet Take 1 tablet (50 mg total) by mouth 3 (three) times daily as needed. 90 tablet 2   LORazepam  (ATIVAN ) 0.5 MG tablet  TAKE 1 TABLET(0.5 MG) BY MOUTH TWICE DAILY AS NEEDED FOR ANXIETY. DO NOT drive FOR 8 HOURS AFTER TAKING. BACKUP IF HYDROXYZINE  NOT EFFECTIVE 15 tablet 1   progesterone  (PROMETRIUM ) 100 MG capsule Take 100 mg by mouth at bedtime.     albuterol  (VENTOLIN  HFA) 108 (90 Base) MCG/ACT inhaler INHALE 2 PUFFS BY MOUTH EVERY 6 HOURS AS NEEDED 8.5 g 1   BREO ELLIPTA  200-25 MCG/ACT AEPB Inhale 1 puff into the lungs daily. 1 each 11   No current facility-administered medications for this visit.    Allergies-reviewed and updated No Known Allergies  Social History   Social History Narrative   Single- lives alone but close to mom. No children.       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       Hobbies: reading, walks   Objective  Objective:  BP 110/82   Pulse 80   Temp (!) 97.3 F (36.3 C)   Ht 5' 1 (1.549  m)   Wt 201 lb 6.4 oz (91.4 kg)   SpO2 96%   BMI 38.05 kg/m  Gen: NAD, resting comfortably HEENT: Mucous membranes are moist. Oropharynx normal Neck: no thyromegaly CV: RRR no murmurs rubs or gallops Lungs: CTAB no crackles, wheeze, rhonchi Abdomen: soft/nontender/nondistended/normal bowel sounds. No rebound or guarding.  Ext: no edema Skin: warm, dry Neuro: grossly normal, moves all extremities, PERRLA   Assessment and Plan   48 y.o. female presenting for annual physical.  Health Maintenance counseling: 1. Anticipatory guidance: Patient counseled regarding regular dental exams -q6 months, eye exams - yearly,  avoiding smoking and second hand smoke , limiting alcohol to 1 beverage per day- rare social alcohol , no illicit drugs .   2. Risk factor reduction:  Advised patient of need for regular exercise and diet rich and fruits and vegetables to reduce risk of heart attack and stroke.  Exercise- not as well lately as last year- had been doing 2 miles a day most days- wants to restart this. 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  Wt Readings from Last 3 Encounters:  10/30/23 201 lb 6.4 oz (91.4 kg)  09/11/23 200 lb 9.6 oz (91 kg)  08/06/23 194 lb (88 kg)  3. Immunizations/screenings/ancillary studies- up to date - Prevnar 20 today Immunization History  Administered Date(s) Administered   Fluzone Influenza virus vaccine,trivalent (IIV3), split virus 02/20/2009   Influenza Split 02/17/2012   Influenza Whole 03/12/2007, 02/14/2008   Influenza,inj,Quad PF,6+ Mos 05/13/2013, 05/05/2014, 02/27/2015, 05/07/2022   Influenza-Unspecified 02/28/2020, 03/01/2021, 05/07/2022   Novel Infuenza-h1n1-09 03/22/2008   PFIZER(Purple  Top)SARS-COV-2 Vaccination 06/21/2019, 07/12/2019, 02/07/2020   PNEUMOCOCCAL CONJUGATE-20 10/30/2023   PPD Test 08/03/2019   Pfizer Covid-19 Vaccine Bivalent Booster 77yrs & up 04/03/2023   Pfizer(Comirnaty)Fall Seasonal Vaccine 12 years and older 03/18/2023   Pneumococcal Polysaccharide-23 02/06/2008   Td 06/04/2010   Tdap 05/16/2009, 09/02/2021   Unspecified SARS-COV-2 Vaccination 06/21/2019, 07/12/2019, 02/07/2020, 10/20/2020, 03/01/2021, 11/04/2021   Varicella 12/21/2012, 01/23/2013   Varicella Zoster Immune Globulin 05/16/2012   4. Cervical  cancer screening- pap smear 12/12/19 with 3-5 year repeat- human papilloma virus was negative- August was plan per last note but that could change if cervix removed and she believes they are  5. Breast cancer screening-  breast exam with gynecology  and mammogram 05/29/23 6. Colon cancer screening - 06/27/22 with 5 year repeat planned 7. Skin cancer screening- had to cancel appointment but plans to reschedule. advised regular sunscreen use. Denies worrisome, changing, or new skin lesions.  8. Birth control/STD check- still not dating. Plus will have hysterectomy 9. Osteoporosis screening at 81- will plan on this- former smoking slightly increases risk 10. Smoking associated screening - former smoker- 7.5 pack years but quit in distant past- will check urinalysis   Status of chronic or acute concerns   # upcoming hysterectomy- can complete 4 mets of activity without chest pain or shortness of breath such as going up 2 flights of stairs and carry on conversation- would not need further cardiac evaluation as medically maximized and would be low risk- tachycardia was related to illness and poor sleepand has subsequently resolved -will still be recovering at beginning of next school year -cyst removal included  # Tachycardia S: Recently noted heart rate up to 120-130 almost all day long-she called our office and was advised to be evaluated-she was seen in  urgent care 10/26/2023.  She had had a cold the prior 2 days and may have flared her asthma as well as disrupted her sleep As she had been using decongestants and was still drinking coffee-thought likely combination of hypovolemia, anxiety, stress, anemia but other discussions were had.  They consider chest x-ray and labs but she preferred conservative care and follow-up with with us  if symptoms fail to improve and thankfully heart rate much improved A/P: thankfully has resolved- continue to monitor    # Depression and history anxiety and panic attacks S: Medication:hydroxyzine  50 mg mainly at night with lorazepam  as backup for more intense anxiety, Prozac  10 mg started 11/14/22- later up to 20 mg through DrAaron Aas Deborra Falter -Dr. Deborra Falter saw showr term and triealed vyvanse  -prior severe situational stress related to work related to prior school- resolved at new school. Prior Wellbutrin  in 2023 -stopped therapy and later restarted- has good fit with new therapist. Also seeing nutritionist out of [place] and covered by covered by insurance- body and mind nutrition A/P: depression and history and panic attacks overall well controlled with therapy and as needd hydroxyzine  or if more intense ativan - usuing about 15 over 3 months. Continue therapy and current meds  # ADD S:medication(s): none -prior Vyvanse  in past but was causing insomnia. Mild improvement on wellbutrin   A/P: less htan ideal control but medicine trials have not worked well so remain off   # Iron  deficiency anemia-most recent visit 07/27/2023 with Dr. Lee Public with resolution of iron  deficiency anemia.  May have been temporary absorption issue.  Was released from hematology clinic - prior infusions  #asthma- well controlled on Breo ellipta  200-25 mcg daily (prior advair not covered) with sparing albuterol    #hormone replacement therapy - on estradiol  plus progesterone  through gynecology  -Planned hysterectomy 12/03/2023  #MUSCULOSKELETAL- has used  flexeril and tizanidine for muscle spasms in the past  such as with neck tension- much better lately  #hyperlipidemia S: Medication:none  Lab Results  Component Value Date   CHOL 186 11/14/2022   HDL 63.80 11/14/2022   LDLCALC 108 (H) 11/14/2022   TRIG 71.0 11/14/2022   CHOLHDL 3 11/14/2022   A/P: minimal  elevations in past in lipids- will update labs today nonfasting- triglyceride(s) have been under 100 so should be fine  Recommended follow up: Return in about 6 months (around 04/30/2024) for followup or sooner if needed.Schedule b4 you leave.   Lab/Order associations: NOT fasting- 2 eggs and banana   ICD-10-CM   1. Preventative health care  Z00.00     2. Need for pneumococcal 20-valent conjugate vaccination  Z23 Pneumococcal conjugate vaccine 20-valent (Prevnar 20)    3. Iron  deficiency anemia, unspecified iron  deficiency anemia type  D50.9 Iron , TIBC and Ferritin Panel    4. GAD (generalized anxiety disorder)  F41.1     5. Mild hyperlipidemia  E78.5 Comprehensive metabolic panel with GFR    CBC with Differential/Platelet    Lipid panel    6. Screening for diabetes mellitus  Z13.1 Hemoglobin A1c    7. Obesity (BMI 30-39.9)  E66.9 Hemoglobin A1c    8. Former smoker  Z87.891 Urinalysis, Routine w reflex microscopic      Meds ordered this encounter  Medications   BREO ELLIPTA  200-25 MCG/ACT AEPB    Sig: Inhale 1 puff into the lungs daily.    Dispense:  1 each    Refill:  11    Return precautions advised.  Clarisa Crooked, MD

## 2023-10-31 ENCOUNTER — Ambulatory Visit: Payer: Self-pay | Admitting: Family Medicine

## 2023-10-31 LAB — CBC WITH DIFFERENTIAL/PLATELET
Absolute Lymphocytes: 2014 {cells}/uL (ref 850–3900)
Absolute Monocytes: 289 {cells}/uL (ref 200–950)
Basophils Absolute: 29 {cells}/uL (ref 0–200)
Basophils Relative: 0.6 %
Eosinophils Absolute: 338 {cells}/uL (ref 15–500)
Eosinophils Relative: 6.9 %
HCT: 45.3 % — ABNORMAL HIGH (ref 35.0–45.0)
Hemoglobin: 14.3 g/dL (ref 11.7–15.5)
MCH: 29.1 pg (ref 27.0–33.0)
MCHC: 31.6 g/dL — ABNORMAL LOW (ref 32.0–36.0)
MCV: 92.1 fL (ref 80.0–100.0)
MPV: 9.4 fL (ref 7.5–12.5)
Monocytes Relative: 5.9 %
Neutro Abs: 2230 {cells}/uL (ref 1500–7800)
Neutrophils Relative %: 45.5 %
Platelets: 273 10*3/uL (ref 140–400)
RBC: 4.92 10*6/uL (ref 3.80–5.10)
RDW: 12.8 % (ref 11.0–15.0)
Total Lymphocyte: 41.1 %
WBC: 4.9 10*3/uL (ref 3.8–10.8)

## 2023-10-31 LAB — IRON,TIBC AND FERRITIN PANEL
%SAT: 20 % (ref 16–45)
Ferritin: 19 ng/mL (ref 16–232)
Iron: 74 ug/dL (ref 40–190)
TIBC: 375 ug/dL (ref 250–450)

## 2023-10-31 LAB — COMPREHENSIVE METABOLIC PANEL WITH GFR
AG Ratio: 1.9 (calc) (ref 1.0–2.5)
ALT: 12 U/L (ref 6–29)
AST: 16 U/L (ref 10–35)
Albumin: 4.4 g/dL (ref 3.6–5.1)
Alkaline phosphatase (APISO): 59 U/L (ref 31–125)
BUN: 14 mg/dL (ref 7–25)
CO2: 22 mmol/L (ref 20–32)
Calcium: 9.4 mg/dL (ref 8.6–10.2)
Chloride: 105 mmol/L (ref 98–110)
Creat: 0.76 mg/dL (ref 0.50–0.99)
Globulin: 2.3 g/dL (ref 1.9–3.7)
Glucose, Bld: 92 mg/dL (ref 65–99)
Potassium: 4.2 mmol/L (ref 3.5–5.3)
Sodium: 139 mmol/L (ref 135–146)
Total Bilirubin: 0.5 mg/dL (ref 0.2–1.2)
Total Protein: 6.7 g/dL (ref 6.1–8.1)
eGFR: 96 mL/min/{1.73_m2} (ref >=60)

## 2023-10-31 LAB — URINALYSIS, ROUTINE W REFLEX MICROSCOPIC
Bilirubin Urine: NEGATIVE
Glucose, UA: NEGATIVE
Hgb urine dipstick: NEGATIVE
Ketones, ur: NEGATIVE
Leukocytes,Ua: NEGATIVE
Nitrite: NEGATIVE
Protein, ur: NEGATIVE
Specific Gravity, Urine: 1.008 (ref 1.001–1.035)
pH: 7.5 (ref 5.0–8.0)

## 2023-10-31 LAB — LIPID PANEL
Cholesterol: 176 mg/dL (ref <200)
HDL: 62 mg/dL (ref >=50)
LDL Cholesterol (Calc): 93 mg/dL
Non-HDL Cholesterol (Calc): 114 mg/dL (ref <130)
Total CHOL/HDL Ratio: 2.8 (calc) (ref <5.0)
Triglycerides: 116 mg/dL (ref <150)

## 2023-10-31 LAB — HEMOGLOBIN A1C
Hgb A1c MFr Bld: 5.4 % (ref <5.7)
Mean Plasma Glucose: 108 mg/dL
eAG (mmol/L): 6 mmol/L

## 2023-11-02 ENCOUNTER — Other Ambulatory Visit: Payer: Self-pay | Admitting: Family Medicine

## 2023-11-06 NOTE — Telephone Encounter (Signed)
 Team can you clarify-does she need this from us ?  Please call her also does she want the 25 or 50 mg dose

## 2023-11-08 ENCOUNTER — Telehealth: Payer: Self-pay

## 2023-11-08 MED ORDER — HYDROXYZINE HCL 25 MG PO TABS: 25.0000 mg | ORAL_TABLET | Freq: Three times a day (TID) | ORAL | 3 refills | Status: AC | PRN

## 2023-11-08 NOTE — Telephone Encounter (Signed)
 New rx sent to pharmacy.  Copied from CRM [id number]. Topic: Clinical - Medication Question >> Nov 08, 2023 12:24 PM Burnard DEL wrote: Reason for CRM: Patient called I regarding medication  Hydroxyzine  25 mg. She stated that she is prescribed the 50 mg dosage but she said that she like to have the 25 mg on hand in case her anxiety isn't as bad on a given day,then she will just take 25 mg instead of 50 mg.

## 2023-12-05 ENCOUNTER — Encounter (HOSPITAL_COMMUNITY): Payer: Self-pay | Admitting: Obstetrics and Gynecology

## 2023-12-05 NOTE — Progress Notes (Signed)
 Spoke w/ via phone for pre-op interview--- pt Lab needs dos----       upt  Lab results------ lab appt 12-06-2023 2 0930 getting CBC/ T&S COVID test -----patient states asymptomatic no test needed Arrive at ------- 0945 on 12-11-2023  NPO after MN NO Solid Food.  Clear liquids from MN until--- 0845 Pre-Surgery Ensure or G2: n/a  Med rec completed Medications to take morning of surgery ----- breo inhaler/ if needed ativan  or hydroxyzine  as directed Diabetic medication ----- n/a  GLP1 agonist last dose: n/a GLP1 instructions:  Patient instructed no nail polish to be worn day of surgery Patient instructed to bring photo id and insurance card day of surgery Patient aware to have Driver (ride ) / caregiver    for 24 hours after surgery - brother, gregory Werst Patient Special Instructions ----- will pick up bag w/ soap and written instructions at lab appt Pre-Op special Instructions ----- n/a  Patient verbalized understanding of instructions that were given at this phone interview. Patient denies chest pain, sob, fever, cough at the interview.

## 2023-12-05 NOTE — Pre-Procedure Instructions (Signed)
 Surgical Instructions   Your procedure is scheduled on :  Monday,  12-11-2023. Report to [facility] Main Entrance A at 9:45  A.M., then check in with the Admitting office. Any questions or running late day of surgery: call [phone]  Questions prior to your surgery date: call [phone], Monday-Friday, 8am-4pm. If you experience any cold or flu symptoms such as cough, fever, chills, shortness of breath, etc. between now and your scheduled surgery, please notify your surgeon office.    Remember:  Do not eat any food after midnight the night before your surgery.  You may have clear liquids from midnight night before surgery until 8:45 AM.     Clear liquids allowed are:  Water Carbonated Beverages Clear Tea (no milk, honey, etc.) Black Coffee Only (NO MILK, CREAM OR POWDERED CREAMER of any kind) Sport drinks, like Gatorade  NO clear liquids after 8:45 AM day of surgery.  This includes no water candy,  gum,  and  mints.    Take these medicines the morning of surgery with A SIPS OF WATER : Breo Ellipta  inhaler   May take these medicines IF NEEDED: Lorazepam  (ativan ) or Hydroxyzine  (atarax ) as directed Albuterol  (ventolin ) inhaler ~Please bring your rescue inhaler with you day of surgery    One week prior to surgery, STOP taking any Aspirin (unless otherwise instructed by your surgeon) Aleve , Naproxen , Ibuprofen, Motrin, Advil, Goody's, BC's, all herbal medications, fish oil, and non-prescription vitamins.                     Do NOT Smoke (Tobacco/Vaping) and Do Not drink alcohol for 24 hours prior to your procedure.  If you use a CPAP at night, you may bring your mask/headgear for your overnight stay.   You will be asked to remove any contacts, glasses, piercing's, hearing aid's, dentures/partials prior to surgery. Please bring cases for these items if needed.    Patients discharged the day of surgery will not be allowed to drive home, and someone needs to stay with  them for 24 hours.  SURGICAL WAITING ROOM VISITATION Patients may have no more than 2 support people in the waiting area - these visitors may rotate.   Pre-op nurse will coordinate an appropriate time for 1 ADULT support person, who may not rotate, to accompany patient in pre-op.  Children under the age of 107 must have an adult with them who is not the patient and must remain in the main waiting area with an adult.  If the patient needs to stay at the hospital during part of their recovery, the visitor guidelines for inpatient rooms apply.  Please refer to the [facility] website for the visitor guidelines for any additional information.   If you received a COVID test during your pre-op visit  it is requested that you wear a mask when out in public, stay away from anyone that may not be feeling well and notify your surgeon if you develop symptoms. If you have been in contact with anyone that has tested positive in the last 10 days please notify you surgeon.      Pre-operative CHG Bathing Instructions   You can play a key role in reducing the risk of infection after surgery. Your skin needs to be as free of germs as possible. You can reduce the number of germs on your skin by washing with CHG (chlorhexidine gluconate) soap before surgery. CHG is an antiseptic soap that kills germs and continues to kill germs even  after washing.   DO NOT use if you have an allergy to chlorhexidine/CHG or antibacterial soaps. If your skin becomes reddened or irritated, stop using the CHG and notify Pre-op nurse day of surgery.             TAKE A SHOWER THE NIGHT BEFORE SURGERY AND THE DAY OF SURGERY    Please keep in mind the following:  DO NOT shave, including legs and underarms, 48 hours prior to surgery.   You may shave your face before/day of surgery.  Place clean sheets on your bed the night before surgery Use a clean washcloth (not used since being washed) for each shower. DO NOT sleep with pet's  night before surgery.  CHG Shower Instructions:  Wash your face and private area with normal soap. If you choose to wash your hair, wash first with your normal shampoo.  After you use shampoo/soap, rinse your hair and body thoroughly to remove shampoo/soap residue.  Turn the water OFF and apply half the bottle of CHG soap to a CLEAN washcloth.  Apply CHG soap ONLY FROM YOUR NECK DOWN TO YOUR TOES (washing for 3-5 minutes)  DO NOT use CHG soap on face, private areas, open wounds, or sores.  Pay special attention to the area where your surgery is being performed.  If you are having back surgery, having someone wash your back for you may be helpful. Wait 2 minutes after CHG soap is applied, then you may rinse off the CHG soap.  Pat dry with a clean towel  Put on clean pajamas    Additional instructions for the day of surgery: DO NOT APPLY any lotions,  powder,  oils,  deodorants (may use underarm deodorant) , cologne/  perfumes  or makeup Do not wear jewelry / piercing's/  metal/  permanent jewelry must be removed prior to arrival day of surgery.  (No plastic piercing) Do not wear nail polish, gel polish, artificial nails, or any other type of covering on natural finger nails (toe nails are okay) Do not bring valuables to the hospital. [facility] is not responsible for valuables/personal belongings. Put on clean/comfortable clothes.  Please brush your teeth.  Ask your nurse before applying any prescription medications to the skin.

## 2023-12-06 ENCOUNTER — Encounter (HOSPITAL_COMMUNITY)
Admission: RE | Admit: 2023-12-06 | Discharge: 2023-12-06 | Disposition: A | Discharge status: Home / Self Care | Source: Ambulatory Visit | Attending: Obstetrics and Gynecology | Admitting: Obstetrics and Gynecology

## 2023-12-06 DIAGNOSIS — Z01818 Encounter for other preprocedural examination: Secondary | ICD-10-CM | POA: Diagnosis present

## 2023-12-06 DIAGNOSIS — Z01812 Encounter for preprocedural laboratory examination: Secondary | ICD-10-CM | POA: Diagnosis not present

## 2023-12-06 LAB — CBC
HCT: 40.5 % (ref 36.0–46.0)
Hemoglobin: 13.5 g/dL (ref 12.0–15.0)
MCH: 29.9 pg (ref 26.0–34.0)
MCHC: 33.3 g/dL (ref 30.0–36.0)
MCV: 89.6 fL (ref 80.0–100.0)
Platelets: 266 K/uL (ref 150–400)
RBC: 4.52 MIL/uL (ref 3.87–5.11)
RDW: 12.6 % (ref 11.5–15.5)
WBC: 6.6 K/uL (ref 4.0–10.5)
nRBC: 0 % (ref 0.0–0.2)

## 2023-12-06 LAB — TYPE AND SCREEN
ABO/RH(D): A POS
Antibody Screen: NEGATIVE

## 2023-12-06 NOTE — H&P (Signed)
 pre op, 12-05-2023 surgery 12/11/23 Performed by RUBIE HUSKY, MD, OB/GYN, [phone]  History of Present Illness Pt. here for pre op visit; RA TLH/BSO scheduled 07/28; on Estradiol  patch/Progesterone /tb  Pelvic pain/AUB/pelvic mass: Seen by prior gyn on 03/13/23 for pelvic and back pain.   In terms of pelvic pain, has been having intermittent right lower quadrant pain since 01/2023. Sometimes the pain comes in a cluster, sometimes goes for weeks without an episode. She reports that she has episodes of severe back pain and her back going out only in the setting of her menstrual cycle. She was seen in the emergency room on 08/06/2023 for this right sided back pain.   In terms of AUB, long history of heavy menses and IDA requiring iron  infusion (follows with heme at [facility]). Had cycle in Sept., March and May and then had spotting last week. She typically wears pads and has to change them every 3 hours. No period since May. Going in for bloodwork tomorrow. Most recent Hgb 14.   In terms of pelvic mass: Pt. was seen by gyn onc (Dr. Eldonna) as a surgical consult and ultimately Dr. Eldonna determined that the right ovarian cyst has a very low likelihood of malignancy and recommend surgery be performed by benign gynecology.  Allergies NKDA  PMH:  -She is on estrogen and progesterone  for HRT. -asthma: Breo Ellipta  and albuterol   -anxiety -anemia -back pain: Flexeril and tizanidine  PSH: -breast -eye -LEEP in 1997 -tonsils  Review of Systems ROS as noted in the HPI   Physical Exam Annual Gyn  Chaperone Chaperone: present  Constitutional General Appearance: healthy-appearing, well-nourished, well-developed (BMI 38)  Psychiatric Orientation: to time, to place, to person Mood and Affect: active and alert, normal mood, normal affect  Skin Appearance: no rashes, no lesions  Lungs Respiratory Effort: no intercostal retractions, no accessory muscle  usage  Cardiovascular Peripheral Vascular: (well perfused)  Female Genitalia Vulva: no masses, no atrophy, no lesions Vagina: no tenderness, no erythema, no abnormal vaginal discharge, no vesicle(s) or ulcers, no cystocele, no rectocele Cervix: grossly normal, no discharge, no cervical motion tenderness Uterus: normal size, normal shape, midline, no uterine prolapse, mobile, non-tender Bladder/Urethra: normal meatus, no urethral discharge, no urethral mass, bladder non distended, Urethra well supported Adnexa/Parametria: no parametrial tenderness, no parametrial mass, adnexal tenderness, ovarian mass (right sided ovarian mass palpated, moderately tender to palpation.) Exam performed by me on 10/16/2023   Imaging: 04/11/23: pelvic ultrasound small uterine fibroids measuring 2.5 cm and 0.9 cm and a complex right ovarian cyst with multiple thin septations measuring 4.3 x 3.9 x 2.8 cm.    06/12/2023: pelvic US . slight enlargement of the complex right ovarian cyst measuring 5.7 x 4.6 x 3.3 cm. Low risk premenopausal Roma of 1.09.   07/25/2023: pelvic ultrasound was completed with overall stable size of cyst.  08/06/2023: CT abdomen/pelvis showed a septated right adnexal cystic mass measuring 5.5 x 3.3 cm. Likely submucosal fibroid measuring 2.7cm.  US  PELVIC COMPLETE W TRANSVAGINAL AND TORSION R/O 08/06/2023   FINDINGS:  Uterus  Measurements: 7.6 cm in sagittal dimension. Heterogeneously  hypoechoic submucosal mass within the right uterine body measures  2.7 x 2.4 x 2.4 cm and demonstrates proximally 25% submucosal  component.    Endometrium  Thickness: 7 mm. No focal abnormality visualized.    Right ovary  Measurements: 5.6 x 3.5 x 2.8 cm = volume: 28.3 mL. Contains 2  adjacent simple-appearing cysts measuring up to 3.1 x 3.1 x 2.8 cm.  Left ovary  Not seen.    Pulsed Doppler evaluation of the right ovary demonstrates normal  low-resistance arterial and venous waveforms.     Other findings    Small volume pelvic free fluid adjacent to the right adnexa.    IMPRESSION:  1. No sonographic finding of right ovarian torsion. Two adjacent  simple-appearing right ovarian cysts measuring up to 3.1 cm, likely  physiologic, however their combined size may result in lead point in  torsion. Recommend evaluation with Doppler ultrasound examination if  there is acute onset of pelvic pain.  2. Left ovary is not seen.  3. Heterogeneously hypoechoic submucosal mass within the right  uterine body measures 2.7 cm and demonstrates proximally 25%  submucosal component, likely a leiomyoma.  4. Small volume pelvic free fluid adjacent to the right adnexa,  likely physiologic.    Labs:  -CA125 of 7.1, HE4: 60.7, premenopausal ROMA: low risk -Surgical pathology (03/13/23): Endometrial biopsy: atrophic endometrium -h/o CIN3, had LEEP in 1997 with normal paps since then. Most recent pap ASCUS/HPV neg 03/13/2023  A/P: Today we discussed the risks, benefits, and alternatives of robot assisted total laparoscopic hysterectomy, bilateral salpingectomy, unilateral oophorectomy, cystoscopy, and other indicated procedures. Specific procedure risks discussed included risk of bleeding, infection (wound infection or pelvic infection) and damage to other structures. Specifically discussed risk of damage to blood vessels, nerves, bowel, bladder, and ureters. She understands that any of the above could necessitate further laparoscopy or laparotomy intraop, as well as additional treatment or surgeries (either on the day of surgery or possibly in the future). In terms of risk of bleeding, pt accepts a blood transfusion if necessary and understands the risks involved in a blood transfusion. We also discussed the risks and benefits of oophorectomy. Pt would like us  to remove one of her ovaries if it appears one is contributing to her pelvic pain. She understands that removing both ovaries would place her in  surgical menopause and declines removal of both ovaries. After discussion of the above, pt wishes to proceed with the scheduled procedure.

## 2023-12-11 ENCOUNTER — Ambulatory Visit (HOSPITAL_COMMUNITY)
Admission: RE | Admit: 2023-12-11 | Discharge: 2023-12-11 | Disposition: A | Discharge status: Home / Self Care | Attending: Obstetrics and Gynecology | Admitting: Obstetrics and Gynecology

## 2023-12-11 ENCOUNTER — Other Ambulatory Visit: Payer: Self-pay

## 2023-12-11 ENCOUNTER — Encounter (HOSPITAL_COMMUNITY)
Admission: RE | Disposition: A | Discharge status: Home / Self Care | Payer: Self-pay | Source: Home / Self Care | Attending: Obstetrics and Gynecology

## 2023-12-11 ENCOUNTER — Ambulatory Visit (HOSPITAL_COMMUNITY): Admitting: Anesthesiology

## 2023-12-11 ENCOUNTER — Encounter (HOSPITAL_COMMUNITY): Payer: Self-pay | Admitting: Obstetrics and Gynecology

## 2023-12-11 DIAGNOSIS — D250 Submucous leiomyoma of uterus: Secondary | ICD-10-CM | POA: Diagnosis present

## 2023-12-11 DIAGNOSIS — Z9889 Other specified postprocedural states: Secondary | ICD-10-CM

## 2023-12-11 DIAGNOSIS — J45909 Unspecified asthma, uncomplicated: Secondary | ICD-10-CM | POA: Diagnosis not present

## 2023-12-11 DIAGNOSIS — Z01818 Encounter for other preprocedural examination: Secondary | ICD-10-CM

## 2023-12-11 DIAGNOSIS — D270 Benign neoplasm of right ovary: Secondary | ICD-10-CM | POA: Insufficient documentation

## 2023-12-11 DIAGNOSIS — D251 Intramural leiomyoma of uterus: Secondary | ICD-10-CM | POA: Insufficient documentation

## 2023-12-11 DIAGNOSIS — R102 Pelvic and perineal pain: Secondary | ICD-10-CM | POA: Diagnosis present

## 2023-12-11 DIAGNOSIS — N72 Inflammatory disease of cervix uteri: Secondary | ICD-10-CM | POA: Insufficient documentation

## 2023-12-11 HISTORY — DX: Diaphragmatic hernia without obstruction or gangrene: K44.9

## 2023-12-11 HISTORY — DX: Personal history of other mental and behavioral disorders: Z86.59

## 2023-12-11 HISTORY — DX: Presence of spectacles and contact lenses: Z97.3

## 2023-12-11 HISTORY — PX: CYSTOSCOPY: SHX5120

## 2023-12-11 HISTORY — DX: Abnormal uterine and vaginal bleeding, unspecified: N93.9

## 2023-12-11 HISTORY — PX: ROBOTIC ASSISTED TOTAL HYSTERECTOMY WITH BILATERAL SALPINGO OOPHERECTOMY: SHX6086

## 2023-12-11 HISTORY — DX: Major depressive disorder, single episode, unspecified: F32.9

## 2023-12-11 HISTORY — DX: Mild persistent asthma, uncomplicated: J45.30

## 2023-12-11 HISTORY — DX: Unspecified ovarian cyst, right side: N83.201

## 2023-12-11 HISTORY — DX: Unspecified osteoarthritis, unspecified site: M19.90

## 2023-12-11 HISTORY — DX: Hyperlipidemia, unspecified: E78.5

## 2023-12-11 HISTORY — DX: Leiomyoma of uterus, unspecified: D25.9

## 2023-12-11 HISTORY — DX: Other specified behavioral and emotional disorders with onset usually occurring in childhood and adolescence: F98.8

## 2023-12-11 HISTORY — DX: Generalized anxiety disorder: F41.1

## 2023-12-11 HISTORY — DX: Pelvic and perineal pain: R10.2

## 2023-12-11 HISTORY — DX: Pelvic and perineal pain unspecified side: R10.20

## 2023-12-11 HISTORY — DX: Intra-abdominal and pelvic swelling, mass and lump, unspecified site: R19.00

## 2023-12-11 HISTORY — DX: Allergic rhinitis, unspecified: J30.9

## 2023-12-11 LAB — ABO/RH: ABO/RH(D): A POS

## 2023-12-11 LAB — POCT PREGNANCY, URINE: Preg Test, Ur: NEGATIVE

## 2023-12-11 SURGERY — HYSTERECTOMY, TOTAL, ROBOT-ASSISTED, LAPAROSCOPIC, WITH BILATERAL SALPINGO-OOPHORECTOMY
Anesthesia: General | Site: Bladder

## 2023-12-11 MED ORDER — LACTATED RINGERS IV SOLN: INTRAVENOUS | Status: DC

## 2023-12-11 MED ORDER — AMISULPRIDE (ANTIEMETIC) 5 MG/2ML IV SOLN
10.0000 mg | Freq: Once | INTRAVENOUS | Status: AC
  Administered 2023-12-11: 10 mg via INTRAVENOUS

## 2023-12-11 MED ORDER — MENTHOL 3 MG MT LOZG: 1.0000 | LOZENGE | OROMUCOSAL | Status: DC | PRN

## 2023-12-11 MED ORDER — ONDANSETRON HCL 4 MG/2ML IJ SOLN
4.0000 mg | Freq: Four times a day (QID) | INTRAMUSCULAR | Status: DC | PRN
  Administered 2023-12-11: 4 mg via INTRAVENOUS
  Filled 2023-12-11: qty 2

## 2023-12-11 MED ORDER — CHLORHEXIDINE GLUCONATE 0.12 % MT SOLN
15.0000 mL | Freq: Once | OROMUCOSAL | Status: AC
  Administered 2023-12-11: 15 mL via OROMUCOSAL

## 2023-12-11 MED ORDER — METRONIDAZOLE 500 MG/100ML IV SOLN
500.0000 mg | Freq: Once | INTRAVENOUS | Status: AC
  Administered 2023-12-11: 500 mg via INTRAVENOUS

## 2023-12-11 MED ORDER — AMISULPRIDE (ANTIEMETIC) 5 MG/2ML IV SOLN
INTRAVENOUS | Status: AC
Start: 2023-12-11 — End: 2023-12-11
  Filled 2023-12-11: qty 4

## 2023-12-11 MED ORDER — ORAL CARE MOUTH RINSE: 15.0000 mL | Freq: Once | OROMUCOSAL | Status: AC

## 2023-12-11 MED ORDER — DEXAMETHASONE SODIUM PHOSPHATE 10 MG/ML IJ SOLN
INTRAMUSCULAR | Status: AC
  Filled 2023-12-11: qty 1

## 2023-12-11 MED ORDER — ACETAMINOPHEN 500 MG PO TABS
1000.0000 mg | ORAL_TABLET | ORAL | Status: AC
  Administered 2023-12-11: 1000 mg via ORAL

## 2023-12-11 MED ORDER — SCOPOLAMINE 1 MG/3DAYS TD PT72
1.0000 | MEDICATED_PATCH | TRANSDERMAL | Status: DC
  Administered 2023-12-11: 1.5 mg via TRANSDERMAL

## 2023-12-11 MED ORDER — MIDAZOLAM HCL 2 MG/2ML IJ SOLN
INTRAMUSCULAR | Status: AC
  Filled 2023-12-11: qty 2

## 2023-12-11 MED ORDER — ROPIVACAINE HCL 5 MG/ML IJ SOLN
INTRAMUSCULAR | Status: AC
  Filled 2023-12-11: qty 30

## 2023-12-11 MED ORDER — ONDANSETRON HCL 4 MG PO TABS
4.0000 mg | ORAL_TABLET | Freq: Four times a day (QID) | ORAL | Status: DC | PRN
  Administered 2023-12-11: 4 mg via ORAL
  Filled 2023-12-11 (×2): qty 1

## 2023-12-11 MED ORDER — PROPOFOL 10 MG/ML IV BOLUS
INTRAVENOUS | Status: AC
  Filled 2023-12-11: qty 20

## 2023-12-11 MED ORDER — SCOPOLAMINE 1 MG/3DAYS TD PT72
MEDICATED_PATCH | TRANSDERMAL | Status: AC
  Filled 2023-12-11: qty 1

## 2023-12-11 MED ORDER — GABAPENTIN 100 MG PO CAPS
100.0000 mg | ORAL_CAPSULE | Freq: Three times a day (TID) | ORAL | Status: DC
  Administered 2023-12-11 (×2): 100 mg via ORAL
  Filled 2023-12-11 (×2): qty 1

## 2023-12-11 MED ORDER — DEXAMETHASONE SODIUM PHOSPHATE 10 MG/ML IJ SOLN
INTRAMUSCULAR | Status: DC | PRN
  Administered 2023-12-11: 10 mg via INTRAVENOUS

## 2023-12-11 MED ORDER — FLUORESCEIN SODIUM 10 % IV SOLN
INTRAVENOUS | Status: AC
  Filled 2023-12-11: qty 5

## 2023-12-11 MED ORDER — CELECOXIB 200 MG PO CAPS
ORAL_CAPSULE | ORAL | Status: AC
  Filled 2023-12-11: qty 2

## 2023-12-11 MED ORDER — ACETAMINOPHEN 500 MG PO TABS
ORAL_TABLET | ORAL | Status: AC
  Filled 2023-12-11: qty 2

## 2023-12-11 MED ORDER — METRONIDAZOLE 500 MG/100ML IV SOLN
INTRAVENOUS | Status: AC
  Filled 2023-12-11: qty 100

## 2023-12-11 MED ORDER — SUGAMMADEX SODIUM 200 MG/2ML IV SOLN
INTRAVENOUS | Status: DC | PRN
  Administered 2023-12-11: 200 mg via INTRAVENOUS

## 2023-12-11 MED ORDER — CEFAZOLIN SODIUM-DEXTROSE 2-4 GM/100ML-% IV SOLN
2.0000 g | INTRAVENOUS | Status: AC
Start: 2023-12-11 — End: 2023-12-11
  Administered 2023-12-11: 2 g via INTRAVENOUS

## 2023-12-11 MED ORDER — FLUORESCEIN SODIUM 10 % IV SOLN
INTRAVENOUS | Status: DC | PRN
  Administered 2023-12-11: .25 mL via INTRAVENOUS

## 2023-12-11 MED ORDER — OXYCODONE HCL 5 MG PO TABS: 5.0000 mg | ORAL_TABLET | ORAL | Status: DC | PRN

## 2023-12-11 MED ORDER — ROCURONIUM BROMIDE 10 MG/ML (PF) SYRINGE
PREFILLED_SYRINGE | INTRAVENOUS | Status: DC | PRN
  Administered 2023-12-11: 20 mg via INTRAVENOUS
  Administered 2023-12-11: 50 mg via INTRAVENOUS
  Administered 2023-12-11: 20 mg via INTRAVENOUS

## 2023-12-11 MED ORDER — LIDOCAINE 2% (20 MG/ML) 5 ML SYRINGE
INTRAMUSCULAR | Status: AC
  Filled 2023-12-11: qty 5

## 2023-12-11 MED ORDER — ONDANSETRON HCL 4 MG/2ML IJ SOLN
INTRAMUSCULAR | Status: DC | PRN
  Administered 2023-12-11: 4 mg via INTRAVENOUS

## 2023-12-11 MED ORDER — SUGAMMADEX SODIUM 200 MG/2ML IV SOLN
INTRAVENOUS | Status: AC
  Filled 2023-12-11: qty 2

## 2023-12-11 MED ORDER — FENTANYL CITRATE (PF) 250 MCG/5ML IJ SOLN
INTRAMUSCULAR | Status: DC | PRN
  Administered 2023-12-11: 50 ug via INTRAVENOUS
  Administered 2023-12-11: 100 ug via INTRAVENOUS
  Administered 2023-12-11: 50 ug via INTRAVENOUS

## 2023-12-11 MED ORDER — OXYCODONE HCL 5 MG PO TABS
5.0000 mg | ORAL_TABLET | ORAL | 0 refills | Status: DC | PRN
Start: 2023-12-11 — End: 2024-04-09

## 2023-12-11 MED ORDER — LIDOCAINE 2% (20 MG/ML) 5 ML SYRINGE
INTRAMUSCULAR | Status: DC | PRN
  Administered 2023-12-11: 60 mg via INTRAVENOUS

## 2023-12-11 MED ORDER — POVIDONE-IODINE 10 % EX SWAB: 2.0000 | Freq: Once | CUTANEOUS | Status: DC

## 2023-12-11 MED ORDER — ACETAMINOPHEN 500 MG PO TABS
1000.0000 mg | ORAL_TABLET | Freq: Four times a day (QID) | ORAL | Status: DC
  Administered 2023-12-11 (×2): 1000 mg via ORAL
  Filled 2023-12-11 (×2): qty 2

## 2023-12-11 MED ORDER — FENTANYL CITRATE (PF) 250 MCG/5ML IJ SOLN
INTRAMUSCULAR | Status: AC
  Filled 2023-12-11: qty 5

## 2023-12-11 MED ORDER — SOD CITRATE-CITRIC ACID 500-334 MG/5ML PO SOLN
30.0000 mL | ORAL | Status: DC
Start: 2023-12-11 — End: 2023-12-11

## 2023-12-11 MED ORDER — ROCURONIUM BROMIDE 10 MG/ML (PF) SYRINGE
PREFILLED_SYRINGE | INTRAVENOUS | Status: AC
  Filled 2023-12-11: qty 10

## 2023-12-11 MED ORDER — CEFAZOLIN SODIUM-DEXTROSE 2-4 GM/100ML-% IV SOLN
INTRAVENOUS | Status: AC
  Filled 2023-12-11: qty 100

## 2023-12-11 MED ORDER — SODIUM CHLORIDE 0.9 % IR SOLN
Status: DC | PRN
  Administered 2023-12-11: 1000 mL

## 2023-12-11 MED ORDER — SODIUM CHLORIDE 0.9 % IV SOLN: INTRAVENOUS | Status: DC | PRN

## 2023-12-11 MED ORDER — CHLORHEXIDINE GLUCONATE 0.12 % MT SOLN
OROMUCOSAL | Status: AC
  Filled 2023-12-11: qty 15

## 2023-12-11 MED ORDER — CELECOXIB 200 MG PO CAPS
400.0000 mg | ORAL_CAPSULE | ORAL | Status: AC
  Administered 2023-12-11: 400 mg via ORAL

## 2023-12-11 MED ORDER — PROPOFOL 10 MG/ML IV BOLUS
INTRAVENOUS | Status: DC | PRN
  Administered 2023-12-11: 80 ug/kg/min via INTRAVENOUS
  Administered 2023-12-11: 200 mg via INTRAVENOUS

## 2023-12-11 MED ORDER — SODIUM CHLORIDE 0.9 % IV SOLN
INTRAVENOUS | Status: DC | PRN
  Administered 2023-12-11: 60 mL

## 2023-12-11 MED ORDER — BUPIVACAINE HCL (PF) 0.25 % IJ SOLN
INTRAMUSCULAR | Status: AC
  Filled 2023-12-11: qty 30

## 2023-12-11 MED ORDER — MIDAZOLAM HCL 2 MG/2ML IJ SOLN
INTRAMUSCULAR | Status: DC | PRN
  Administered 2023-12-11: 2 mg via INTRAVENOUS

## 2023-12-11 MED ORDER — SODIUM CHLORIDE (PF) 0.9 % IJ SOLN
INTRAMUSCULAR | Status: AC
  Filled 2023-12-11: qty 50

## 2023-12-11 MED ORDER — BUPIVACAINE HCL (PF) 0.25 % IJ SOLN
INTRAMUSCULAR | Status: DC | PRN
  Administered 2023-12-11: 10 mL

## 2023-12-11 MED ORDER — ONDANSETRON HCL 4 MG/2ML IJ SOLN
INTRAMUSCULAR | Status: AC
Start: 2023-12-11 — End: 2023-12-11
  Filled 2023-12-11: qty 2

## 2023-12-11 MED ORDER — IBUPROFEN 200 MG PO TABS: 600.0000 mg | ORAL_TABLET | Freq: Four times a day (QID) | ORAL | 0 refills | Status: DC | PRN

## 2023-12-11 SURGICAL SUPPLY — 49 items
APPLICATOR ARISTA FLEXITIP XL (MISCELLANEOUS) IMPLANT
BARRIER ADHS 3X4 INTERCEED (GAUZE/BANDAGES/DRESSINGS) IMPLANT
COVER BACK TABLE 60X90IN (DRAPES) ×2 IMPLANT
COVER TIP SHEARS 8 DVNC (MISCELLANEOUS) ×2 IMPLANT
DEFOGGER SCOPE WARM SEASHARP (MISCELLANEOUS) ×2 IMPLANT
DERMABOND ADVANCED .7 DNX12 (GAUZE/BANDAGES/DRESSINGS) ×2 IMPLANT
DRAPE ARM DVNC X/XI (DISPOSABLE) ×8 IMPLANT
DRAPE COLUMN DVNC XI (DISPOSABLE) ×2 IMPLANT
DRAPE UTILITY XL STRL (DRAPES) ×2 IMPLANT
DRIVER NDL MEGA SUTCUT DVNCXI (INSTRUMENTS) ×2 IMPLANT
DRIVER NDLE MEGA SUTCUT DVNCXI (INSTRUMENTS) ×2 IMPLANT
DURAPREP 26ML APPLICATOR (WOUND CARE) ×2 IMPLANT
ELECTRODE REM PT RTRN 9FT ADLT (ELECTROSURGICAL) ×2 IMPLANT
FORCEPS BPLR FENES DVNC XI (FORCEP) ×2 IMPLANT
FORCEPS PROGRASP DVNC XI (FORCEP) IMPLANT
FORCEPS TENACULUM DVNC XI (FORCEP) IMPLANT
GAUZE 4X4 16PLY ~~LOC~~+RFID DBL (SPONGE) IMPLANT
GLOVE BIO SURGEON STRL SZ 6.5 (GLOVE) ×6 IMPLANT
GLOVE BIOGEL PI IND STRL 7.0 (GLOVE) ×6 IMPLANT
HEMOSTAT ARISTA ABSORB 3G PWDR (HEMOSTASIS) IMPLANT
HOLDER FOLEY CATH W/STRAP (MISCELLANEOUS) IMPLANT
IRRIGATION SUCT STRKRFLW 2 WTP (MISCELLANEOUS) ×2 IMPLANT
KIT PINK PAD W/HEAD ARM REST (MISCELLANEOUS) ×2 IMPLANT
KIT TURNOVER KIT B (KITS) ×2 IMPLANT
LEGGING LITHOTOMY PAIR STRL (DRAPES) ×2 IMPLANT
MANIPULATOR ADVINCU DEL 2.5 PL (MISCELLANEOUS) IMPLANT
MANIPULATOR ADVINCU DEL 3.0 PL (MISCELLANEOUS) IMPLANT
MANIPULATOR ADVINCU DEL 3.5 PL (MISCELLANEOUS) IMPLANT
MANIPULATOR ADVINCU DEL 4.0 PL (MISCELLANEOUS) IMPLANT
NDL INSUFFLATION 14GA 120MM (NEEDLE) ×2 IMPLANT
NEEDLE INSUFFLATION 14GA 120MM (NEEDLE) ×2 IMPLANT
OBTURATOR OPTICALSTD 8 DVNC (TROCAR) ×2 IMPLANT
PACK ROBOT WH (CUSTOM PROCEDURE TRAY) ×2 IMPLANT
PACK ROBOTIC GOWN (GOWN DISPOSABLE) ×2 IMPLANT
PAD OB MATERNITY 11 LF (PERSONAL CARE ITEMS) ×2 IMPLANT
PAD PREP 24X48 CUFFED NSTRL (MISCELLANEOUS) ×2 IMPLANT
PROTECTOR NERVE ULNAR (MISCELLANEOUS) ×2 IMPLANT
SCISSORS MNPLR CVD DVNC XI (INSTRUMENTS) ×2 IMPLANT
SEAL UNIV 5-12 XI (MISCELLANEOUS) ×6 IMPLANT
SET IRRIG Y TYPE TUR BLADDER L (SET/KITS/TRAYS/PACK) ×2 IMPLANT
SET TRI-LUMEN FLTR TB AIRSEAL (TUBING) ×2 IMPLANT
SLEEVE SCD COMPRESS KNEE MED (STOCKING) ×2 IMPLANT
SPIKE FLUID TRANSFER (MISCELLANEOUS) ×2 IMPLANT
SUT MNCRL AB 4-0 PS2 18 (SUTURE) ×4 IMPLANT
SUT VIC AB 0 CT1 36 (SUTURE) ×2 IMPLANT
SUT VLOC 180 0 9IN GS21 (SUTURE) ×2 IMPLANT
TOWEL GREEN STERILE (TOWEL DISPOSABLE) ×2 IMPLANT
TRAY FOLEY W/BAG SLVR 14FR (SET/KITS/TRAYS/PACK) ×2 IMPLANT
TROCAR PORT AIRSEAL 5X120 (TROCAR) ×2 IMPLANT

## 2023-12-11 NOTE — Anesthesia Procedure Notes (Signed)
 Procedure Name: Intubation Date/Time: 12/11/2023 12:23 PM  Performed by: [place] Duquette C, CRNAPre-anesthesia Checklist: Patient identified, Emergency Drugs available, Suction available and Patient being monitored Patient Re-evaluated:Patient Re-evaluated prior to induction Oxygen Delivery Method: Circle system utilized Preoxygenation: Pre-oxygenation with 100% oxygen Induction Type: IV induction Ventilation: Mask ventilation without difficulty Laryngoscope Size: Mac and 3 Grade View: Grade I Tube type: Oral Tube size: 7.0 mm Number of attempts: 1 Airway Equipment and Method: Stylet and Oral airway Placement Confirmation: ETT inserted through vocal cords under direct vision, positive ETCO2 and breath sounds checked- equal and bilateral Secured at: 21 cm Tube secured with: Tape Dental Injury: Teeth and Oropharynx as per pre-operative assessment

## 2023-12-11 NOTE — Progress Notes (Signed)
 Updated Pre-Op H&P  The patient was seen and examined by me. She is feeling well today. She denies any changes to her health history or medications. We briefly went over risks of the procedure again. All questions were answered.  Vitals:   12/11/23 1008  BP: (!) 144/90  Pulse: (!) 106  Resp: 18  Temp: 98.4 F (36.9 C)  SpO2: 95%     Exam: Gen- NAD, A+Ox3 Cardiac- well perfused Resp- normal WOB on room air Extr- no trauma Psych- normal mood and affect  Hgb 13.5 Platelets 266 UPT neg   A/P Move forward with surgery as planned. Mother Buel and brother Cathlyn are support people at the bedside. Pt has ibuprofen  and Tylenol  at home, will send oxycodone  post-op.

## 2023-12-11 NOTE — Transfer of Care (Signed)
 Immediate Anesthesia Transfer of Care Note  Patient: Amber Elliott  Procedure(s) Performed: HYSTERECTOMY, TOTAL, ROBOT-ASSISTED, LAPAROSCOPIC, WITH BILATERAL SALPINGECTOMY, RIGHT OOPHORECTOMY (Abdomen) CYSTOSCOPY (Bladder)  Patient Location: PACU  Anesthesia Type:General  Level of Consciousness: awake, alert , and sedated  Airway & Oxygen Therapy: Patient Spontanous Breathing and Patient connected to face mask oxygen  Post-op Assessment: Report given to RN and Post -op Vital signs reviewed and stable  Post vital signs: Reviewed and stable  Last Vitals:  Vitals Value Taken Time  BP 106/67 12/11/23 14:30  Temp 36.6 C 12/11/23 14:30  Pulse 99 12/11/23 14:34  Resp 18 12/11/23 14:34  SpO2 97 % 12/11/23 14:34  Vitals shown include unfiled device data.  Last Pain:  Vitals:   12/11/23 1430  TempSrc:   PainSc: Asleep      Patients Stated Pain Goal: 6 (12/11/23 1008)  Complications: No notable events documented.

## 2023-12-11 NOTE — Discharge Instructions (Signed)
 Nothing in the vagina for 6 weeks. No bath- shower ok. Increase fiber and hydration. Ibuprofen  600mg  and Tylenol  500mg  scheduled together every 6 hours for pain.Oxycodone  every four hours as needed for breakthrough pain. Colace or Miralax as needed for stool softner. Gas-ex for gas or right shoulder pain.

## 2023-12-11 NOTE — Op Note (Signed)
 12/11/2023  2:31 PM  PATIENT:  Amber Elliott  48 y.o. female  PRE-OPERATIVE DIAGNOSIS:  PELVIC PAIN, abnormal uterine bleeding, pelvic mass  POST-OPERATIVE DIAGNOSIS: same; post-op  PROCEDURE:  Procedure(s): HYSTERECTOMY, TOTAL, ROBOT-ASSISTED, LAPAROSCOPIC, WITH BILATERAL SALPINGECTOMY, RIGHT OOPHORECTOMY, CYSTOSCOPY  SURGEON:  Surgeons and Role:    * Claire Rubie LABOR, MD - Primary    * Sarrah Browning, MD - Assisting  ANESTHESIA:   local and general  EBL:  50cc   BLOOD ADMINISTERED:none  DRAINS: none   LOCAL MEDICATIONS USED:  MARCAINE     and ropivacaine   SPECIMEN:  uterus, cervix, bilateral fallopian tubes, right ovary  DISPOSITION OF SPECIMEN:  PATHOLOGY  COUNTS:  correct x2  Findings:   Pelvic/Bimanual: Normal appearing external female genitalia. Normal sized, mobile uterus. Normal appearing vagina and cervix.   Laparoscopic: Mildly enlarged fibroid uterus. Normal appearing left ovary with small functional appearing cyst. Right ovary moderately enlarged with multiple simple appearing cysts. Normal bilateral fallopian tubes. Small, normal appearing paratubal cyst on left tube. The liver edge, diaphragm, and stomach were all normal appearing. Small part of the appendix was visualized and normal appearing with filmy adhesions around it.  After adequate anesthesia was achieved the patient was positioned, prepped and draped in usual sterile fashion.  A routine surgical time out was performed. The bladder catheterized with a foley. A speculum was placed in the vagina and the cervix dilated with pratt dilators. The 3.5 cm Koh ring Advincula was assembled and placed in proper fashion. The speculum was removed.    Attention was turned to the abdomen where a 0.5cm incision was made at the superior aspect of the umbilicus.  The veress needle was introduced without aspiration of bowel contents or blood. The abdomen was insufflated with CO2 gas. The 8.72mm Robotic trocar was  placed at the umbilicus under direct visualization. The patient was placed in Trendelenburg position. Two 8.5 mm trocars were placed on either side of the camera port and a 5 mm assistant port was placed 3 cm above the line of the other trocars.  All trocars were inserted under direct visualization of the camera.  The  Robot docked.  The fenestrated bipolar were placed on arm 2 and the monopolar scissors on arm 4 and introduced under direct visualization of the camera.   I then broke scrub and sat down at the console.  The above findings were noted and the ureters identified well out of the field of dissection.  The left fallopian tube was cauterized then ligated from the mesosalpinx and cornua and removed from the body. The left utero-ovarian ligament was cauterized then ligated. The left broad ligament was cauterized then ligated to the left round ligament, which was cauterized then ligated. A bladder flap was created and the left uterine artery and vein were cauterized. The right round ligament was then cauterized and ligated. The anterior right broad ligament was divided to create a window around the right IP ligament. The right IP ligament was cauterized and ligated. The remainder of the right broad ligament was dissected down to the level uterine artery. The remainder of the bladder flap was completed using sharp and blunt dissection. The right broad and cardinal ligaments were then cauterized against the cervix to the level of the Koh ring, securing the uterine artery. The ureters were identified well out of the field of dissection.  Each pedicle was then incised with the shears.  The monopolar scissors then circumferentially incised the vagina at the level of  the reflection on the [facility] ring.  After the uterus and cervix were amputated, the entire specimen was removed from the body through the vagina. Cautery was used to insure hemostasis of the cuff.  Once hemostasis was achieved, the scissors were  changed to the mega suture cut needle driver and the cuff was closed with a running 0-vicryl V loc suture.  Cautery was used to ensure hemostasis of the pedicles very superficially. The ureters were peristalsing bilaterally well and very lateral to the areas of operation.     The Robot was then undocked and I scrubbed back in.  50cc of Ropivacaine  was introduced into the pelvis. The skin incisions were infused with Marcaine  and closed with subcuticular stitches of 3-0 Monocryl and Dermabond.  All instruments were removed from the vagina and the vaginal cuff was noted to be intact on palpation. The foley was removed and cystoscopy performed, revealing an intact bladder and vigourous spill of urine from each ureteral orifice. The cystoscope was removed.  The patient was doing well when I left the OR.    Rubie Husky, MD

## 2023-12-11 NOTE — Discharge Summary (Signed)
 Physician Discharge Summary  Patient ID: Amber Elliott MRN: [id number] DOB/AGE: 49-Apr-1977 49 y.o.  Admit date: 12/11/2023 Discharge date: 12/11/2023  Admission Diagnoses: pelvic pain, abnormal uterine bleeding, pelvic mass  Discharge Diagnoses: same, post-op Principal Problem:   Post-operative state   Discharged Condition: good  Hospital Course: uncomplicated robot-assisted total laparoscopic hysterectomy, bilateral salpingectomy, right oophorectomy, cystoscopy  Discharge Exam: Blood pressure 122/82, pulse 96, temperature 98.2 F (36.8 C), temperature source Oral, resp. rate 18, height 5' 1 (1.549 m), weight 90.7 kg, last menstrual period 10/01/2023, SpO2 96%.   Disposition: Discharge disposition: 01-Home or Self Care       Discharge Instructions     Call MD for:  difficulty breathing, headache or visual disturbances   Complete by: As directed    Call MD for:  extreme fatigue   Complete by: As directed    Call MD for:  hives   Complete by: As directed    Call MD for:  persistant dizziness or light-headedness   Complete by: As directed    Call MD for:  persistant nausea and vomiting   Complete by: As directed    Call MD for:  redness, tenderness, or signs of infection (pain, swelling, redness, odor or green/yellow discharge around incision site)   Complete by: As directed    Call MD for:  severe uncontrolled pain   Complete by: As directed    Call MD for:  temperature >100.4   Complete by: As directed    Diet - low sodium heart healthy   Complete by: As directed    Discharge instructions   Complete by: As directed    No driving on narcotics, no sexual activity for 2 weeks.   Increase activity slowly   Complete by: As directed    May shower / Bathe   Complete by: As directed    Shower, no bath for 2 weeks.   Remove dressing in 24 hours   Complete by: As directed    Sexual Activity Restrictions   Complete by: As directed    No sexual activity for 2 weeks.       Allergies as of 12/11/2023   No Known Allergies      Medication List     STOP taking these medications    progesterone  100 MG capsule Commonly known as: PROMETRIUM        TAKE these medications    acetaminophen  500 MG tablet Commonly known as: TYLENOL  Take 500-1,000 mg by mouth every 6 (six) hours as needed.   albuterol  108 (90 Base) MCG/ACT inhaler Commonly known as: VENTOLIN  HFA INHALE 2 PUFFS BY MOUTH EVERY 6 HOURS AS NEEDED What changed: reasons to take this   Breo Ellipta  200-25 MCG/ACT Aepb Generic drug: fluticasone  furoate-vilanterol Inhale 1 puff into the lungs daily.   calcium carbonate 500 MG chewable tablet Commonly known as: TUMS - dosed in mg elemental calcium Chew 1 tablet by mouth as needed for indigestion or heartburn.   cyclobenzaprine 5 MG tablet Commonly known as: FLEXERIL Take 1-2 tablets by mouth at bedtime as needed for muscle spasms.   estradiol  0.0375 MG/24HR Commonly known as: VIVELLE -DOT Place 1 patch onto the skin 2 (two) times a week.   hydrOXYzine  25 MG tablet Commonly known as: ATARAX  Take 1 tablet (25 mg total) by mouth 3 (three) times daily as needed. What changed: reasons to take this   ibuprofen  200 MG tablet Commonly known as: ADVIL  Take 3 tablets (600 mg total) by mouth every 6 (  six) hours as needed. What changed: how much to take   LORazepam  0.5 MG tablet Commonly known as: ATIVAN  TAKE 1 TABLET(0.5 MG) BY MOUTH TWICE DAILY AS NEEDED FOR ANXIETY. DO NOT drive FOR 8 HOURS AFTER TAKING. BACKUP IF HYDROXYZINE  NOT EFFECTIVE What changed:  how much to take how to take this when to take this reasons to take this   Lumify 0.025 % Soln Generic drug: Brimonidine Tartrate Apply to eye as needed.   oxyCODONE  5 MG immediate release tablet Commonly known as: Roxicodone  Take 1 tablet (5 mg total) by mouth every 4 (four) hours as needed for severe pain (pain score 7-10).   tiZANidine 4 MG capsule Commonly known as:  ZANAFLEX Take 4 mg by mouth every 6 (six) hours.        Follow-up Information     Claire Rubie LABOR, MD. Go in 2 week(s).   Specialty: Obstetrics and Gynecology Contact information: [address] [place] [address] [phone]                 Signed: Rubie LABOR Claire 12/12/2023, 11:37 AM

## 2023-12-11 NOTE — Anesthesia Preprocedure Evaluation (Signed)
 Anesthesia Evaluation  Patient identified by MRN, date of birth, ID band Patient awake    Reviewed: Allergy & Precautions, NPO status , Patient's Chart, lab work & pertinent test results  History of Anesthesia Complications (+) PONV and history of anesthetic complications  Airway Mallampati: II  TM Distance: >3 FB Neck ROM: Full    Dental  (+) Dental Advisory Given   Pulmonary asthma , former smoker   breath sounds clear to auscultation       Cardiovascular negative cardio ROS  Rhythm:Regular Rate:Normal     Neuro/Psych negative neurological ROS     GI/Hepatic Neg liver ROS, hiatal hernia,GERD  ,,  Endo/Other  negative endocrine ROS    Renal/GU negative Renal ROS     Musculoskeletal  (+) Arthritis ,    Abdominal   Peds  Hematology negative hematology ROS (+)   Anesthesia Other Findings   Reproductive/Obstetrics                              Anesthesia Physical Anesthesia Plan  ASA: 2  Anesthesia Plan: General   Post-op Pain Management: Tylenol  PO (pre-op)* and Celebrex  PO (pre-op)*   Induction: Intravenous  PONV Risk Score and Plan: 4 or greater and Scopolamine  patch - Pre-op, Midazolam , Propofol  infusion, Dexamethasone , Ondansetron  and Treatment may vary due to age or medical condition  Airway Management Planned: Oral ETT  Additional Equipment:   Intra-op Plan:   Post-operative Plan: Extubation in OR  Informed Consent: I have reviewed the patients History and Physical, chart, labs and discussed the procedure including the risks, benefits and alternatives for the proposed anesthesia with the patient or authorized representative who has indicated his/her understanding and acceptance.     Dental advisory given  Plan Discussed with: CRNA  Anesthesia Plan Comments:         Anesthesia Quick Evaluation

## 2023-12-12 ENCOUNTER — Encounter (HOSPITAL_COMMUNITY): Payer: Self-pay | Admitting: Obstetrics and Gynecology

## 2023-12-12 NOTE — Anesthesia Postprocedure Evaluation (Signed)
 Anesthesia Post Note  Patient: Amber Elliott  Procedure(s) Performed: HYSTERECTOMY, TOTAL, ROBOT-ASSISTED, LAPAROSCOPIC, WITH BILATERAL SALPINGECTOMY, RIGHT OOPHORECTOMY (Abdomen) CYSTOSCOPY (Bladder)     Patient location during evaluation: PACU Anesthesia Type: General Level of consciousness: awake and alert Pain management: pain level controlled Vital Signs Assessment: post-procedure vital signs reviewed and stable Respiratory status: spontaneous breathing, nonlabored ventilation and respiratory function stable Cardiovascular status: blood pressure returned to baseline and stable Postop Assessment: no apparent nausea or vomiting Anesthetic complications: no   No notable events documented.                Eleisha Branscomb

## 2023-12-13 LAB — SURGICAL PATHOLOGY

## 2024-03-16 ENCOUNTER — Other Ambulatory Visit: Payer: Self-pay

## 2024-03-16 ENCOUNTER — Emergency Department (HOSPITAL_COMMUNITY)

## 2024-03-16 ENCOUNTER — Encounter (HOSPITAL_COMMUNITY): Payer: Self-pay

## 2024-03-16 ENCOUNTER — Emergency Department (HOSPITAL_COMMUNITY)
Admission: EM | Admit: 2024-03-16 | Discharge: 2024-03-16 | Disposition: A | Discharge status: Home / Self Care | Source: Ambulatory Visit | Attending: Emergency Medicine | Admitting: Emergency Medicine

## 2024-03-16 DIAGNOSIS — R509 Fever, unspecified: Secondary | ICD-10-CM | POA: Diagnosis present

## 2024-03-16 DIAGNOSIS — J45909 Unspecified asthma, uncomplicated: Secondary | ICD-10-CM | POA: Insufficient documentation

## 2024-03-16 DIAGNOSIS — R1032 Left lower quadrant pain: Secondary | ICD-10-CM | POA: Diagnosis not present

## 2024-03-16 DIAGNOSIS — Z7951 Long term (current) use of inhaled steroids: Secondary | ICD-10-CM | POA: Insufficient documentation

## 2024-03-16 DIAGNOSIS — R10814 Left lower quadrant abdominal tenderness: Secondary | ICD-10-CM

## 2024-03-16 LAB — COMPREHENSIVE METABOLIC PANEL WITH GFR
ALT: 27 U/L (ref 0–44)
AST: 30 U/L (ref 15–41)
Albumin: 3.5 g/dL (ref 3.5–5.0)
Alkaline Phosphatase: 58 U/L (ref 38–126)
Anion gap: 12 (ref 5–15)
BUN: 12 mg/dL (ref 6–20)
CO2: 24 mmol/L (ref 22–32)
Calcium: 9 mg/dL (ref 8.9–10.3)
Chloride: 101 mmol/L (ref 98–111)
Creatinine, Ser: 0.77 mg/dL (ref 0.44–1.00)
GFR, Estimated: >60 mL/min (ref >60)
Glucose, Bld: 125 mg/dL — ABNORMAL HIGH (ref 70–99)
Potassium: 3.8 mmol/L (ref 3.5–5.1)
Sodium: 137 mmol/L (ref 135–145)
Total Bilirubin: 0.6 mg/dL (ref 0.0–1.2)
Total Protein: 6.7 g/dL (ref 6.5–8.1)

## 2024-03-16 LAB — URINALYSIS, W/ REFLEX TO CULTURE (INFECTION SUSPECTED)
Bilirubin Urine: NEGATIVE
Glucose, UA: NEGATIVE mg/dL
Hgb urine dipstick: NEGATIVE
Ketones, ur: NEGATIVE mg/dL
Leukocytes,Ua: NEGATIVE
Nitrite: NEGATIVE
Protein, ur: NEGATIVE mg/dL
Specific Gravity, Urine: 1.006 (ref 1.005–1.030)
pH: 5 (ref 5.0–8.0)

## 2024-03-16 LAB — I-STAT CG4 LACTIC ACID, ED: Lactic Acid, Venous: 1.4 mmol/L (ref 0.5–1.9)

## 2024-03-16 LAB — CBC WITH DIFFERENTIAL/PLATELET
Abs Immature Granulocytes: 0.02 K/uL (ref 0.00–0.07)
Basophils Absolute: 0.1 K/uL (ref 0.0–0.1)
Basophils Relative: 1 %
Eosinophils Absolute: 0.3 K/uL (ref 0.0–0.5)
Eosinophils Relative: 5 %
HCT: 43.4 % (ref 36.0–46.0)
Hemoglobin: 14.3 g/dL (ref 12.0–15.0)
Immature Granulocytes: 0 %
Lymphocytes Relative: 34 %
Lymphs Abs: 1.8 K/uL (ref 0.7–4.0)
MCH: 29.5 pg (ref 26.0–34.0)
MCHC: 32.9 g/dL (ref 30.0–36.0)
MCV: 89.5 fL (ref 80.0–100.0)
Monocytes Absolute: 0.4 K/uL (ref 0.1–1.0)
Monocytes Relative: 8 %
Neutro Abs: 2.6 K/uL (ref 1.7–7.7)
Neutrophils Relative %: 52 %
Platelets: 276 K/uL (ref 150–400)
RBC: 4.85 MIL/uL (ref 3.87–5.11)
RDW: 13.1 % (ref 11.5–15.5)
WBC: 5.2 K/uL (ref 4.0–10.5)
nRBC: 0 % (ref 0.0–0.2)

## 2024-03-16 MED ORDER — IOHEXOL 350 MG/ML SOLN
75.0000 mL | Freq: Once | INTRAVENOUS | Status: AC | PRN
  Administered 2024-03-16: 75 mL via INTRAVENOUS

## 2024-03-16 NOTE — ED Triage Notes (Signed)
 Pt came in via POV d/t her PCP recommending that she come in to r/o pelvic abscess. Pt reports she has been having some dull/aching pain in her pelvic area & some pain behind her belly button. Reports se had a hysterectomy December 11, 2023. Has since been treated for Covid & vaginal cellulitis & since she is still spotting & having pain she is here for eval. Has been able to keep her fever down with Tylenol .

## 2024-03-16 NOTE — ED Notes (Signed)
 Pt verbalized understanding of discharge instructions. Opportunity for questions provided.

## 2024-03-16 NOTE — ED Provider Notes (Signed)
 [place] EMERGENCY DEPARTMENT AT [facility] Provider Note   CSN: [id number] Arrival date & time: 03/16/24  1109     Patient presents with: Possible Pelvic Abcess   Amber Elliott is a 48 y.o. female.  {Add pertinent medical, surgical, social history, OB history to HPI:32947} HPI       48 year old female with a history of hyperlipidemia, MDD, history of pelvic pain/abnormal uterine bleeding/pelvic mass status post total hysterectomy with bilateral salpingectomy, right oophorectomy and cystoscopy performed by Dr. Hassan Marin December 11, 2023 who presents with concern for lower abdominal pain.  Past Medical History:  Diagnosis Date   Abnormal uterine bleeding (AUB)    ADD (attention deficit disorder)    Allergic rhinitis    Arthritis    low back   GAD (generalized anxiety disorder)    GERD 05/14/2007   symptoms resolved when became vegetarian   Hiatal hernia    History of gastritis 06/27/2022   and peptic duodenitis   History of panic attacks    Hyperlipidemia    IDA (iron  deficiency anemia)    Leiomyoma of uterus    MDD (major depressive disorder)    Mild persistent asthma without complication    followed by pcp  (12-05-2023  pt stated last exacerbation > 73yr)   Ovarian cyst, right    Pelvic mass    Pelvic pain in female    PONV (postoperative nausea and vomiting)    Wears glasses      Prior to Admission medications   Medication Sig Start Date End Date Taking? Authorizing Provider  acetaminophen  (TYLENOL ) 500 MG tablet Take 500-1,000 mg by mouth every 6 (six) hours as needed.    [provider]  albuterol  (VENTOLIN  HFA) 108 (90 Base) MCG/ACT inhaler INHALE 2 PUFFS BY MOUTH EVERY 6 HOURS AS NEEDED Patient taking differently: Inhale 2 puffs into the lungs every 6 (six) hours as needed for wheezing or shortness of breath. 06/19/23   Katrinka Garnette KIDD, MD  BREO ELLIPTA  200-25 MCG/ACT AEPB Inhale 1 puff into the lungs daily. Patient taking differently:  Inhale 1 puff into the lungs daily. 10/30/23   Katrinka Garnette KIDD, MD  Brimonidine Tartrate (LUMIFY) 0.025 % SOLN Apply to eye as needed.    [provider]  calcium carbonate (TUMS - DOSED IN MG ELEMENTAL CALCIUM) 500 MG chewable tablet Chew 1 tablet by mouth as needed for indigestion or heartburn.    [provider]  cyclobenzaprine (FLEXERIL) 5 MG tablet Take 1-2 tablets by mouth at bedtime as needed for muscle spasms.    [provider]  estradiol  (VIVELLE -DOT) 0.0375 MG/24HR Place 1 patch onto the skin 2 (two) times a week. 05/30/23   [provider]  hydrOXYzine  (ATARAX ) 25 MG tablet Take 1 tablet (25 mg total) by mouth 3 (three) times daily as needed. Patient taking differently: Take 25 mg by mouth 3 (three) times daily as needed for anxiety. 11/08/23   Katrinka Garnette KIDD, MD  ibuprofen  (ADVIL ) 200 MG tablet Take 3 tablets (600 mg total) by mouth every 6 (six) hours as needed. 12/11/23   Claire Raman A, MD  LORazepam  (ATIVAN ) 0.5 MG tablet TAKE 1 TABLET(0.5 MG) BY MOUTH TWICE DAILY AS NEEDED FOR ANXIETY. DO NOT drive FOR 8 HOURS AFTER TAKING. BACKUP IF HYDROXYZINE  NOT EFFECTIVE Patient taking differently: Take 0.5 mg by mouth 2 (two) times daily as needed for anxiety. TAKE 1 TABLET(0.5 MG) BY MOUTH TWICE DAILY AS NEEDED FOR ANXIETY. DO NOT drive  FOR 8 HOURS AFTER TAKING. BACKUP IF HYDROXYZINE  NOT EFFECTIVE 08/07/23   Katrinka Garnette KIDD, MD  oxyCODONE  (ROXICODONE ) 5 MG immediate release tablet Take 1 tablet (5 mg total) by mouth every 4 (four) hours as needed for severe pain (pain score 7-10). 12/11/23   Claire Raman A, MD  tiZANidine (ZANAFLEX) 4 MG capsule Take 4 mg by mouth every 6 (six) hours.    [provider]    Allergies: Patient has no known allergies.    Review of Systems  Updated Vital Signs BP 124/89   Pulse 99   Temp 97.9 F (36.6 C) (Oral)   Resp 18   Ht 5' 1 (1.549 m)   Wt 93 kg   SpO2 96%   BMI 38.73 kg/m   Physical  Exam  (all labs ordered are listed, but only abnormal results are displayed) Labs Reviewed  CBC WITH DIFFERENTIAL/PLATELET  COMPREHENSIVE METABOLIC PANEL WITH GFR  URINALYSIS, W/ REFLEX TO CULTURE (INFECTION SUSPECTED)  I-STAT CG4 LACTIC ACID, ED    EKG: None  Radiology: No results found.  {Document cardiac monitor, telemetry assessment procedure when appropriate:32947} Procedures   Medications Ordered in the ED - No data to display    {Click here for ABCD2, HEART and other calculators REFRESH Note before signing:1}                              Medical Decision Making Amount and/or Complexity of Data Reviewed Labs: ordered. Radiology: ordered.   ***  {Document critical care time when appropriate  Document review of labs and clinical decision tools ie CHADS2VASC2, etc  Document your independent review of radiology images and any outside records  Document your discussion with family members, caretakers and with consultants  Document social determinants of health affecting pt's care  Document your decision making why or why not admission, treatments were needed:32947:::1}   Final diagnoses:  None    ED Discharge Orders     None

## 2024-03-21 LAB — CULTURE, BLOOD (ROUTINE X 2)
Culture: NO GROWTH
Culture: NO GROWTH
Special Requests: ADEQUATE
Special Requests: ADEQUATE

## 2024-04-09 ENCOUNTER — Encounter: Payer: Self-pay | Admitting: Family Medicine

## 2024-04-09 ENCOUNTER — Ambulatory Visit: Admitting: Family Medicine

## 2024-04-09 VITALS — BP 136/72 | HR 79 | Temp 98.1°F | Ht 61.0 in | Wt 202.5 lb

## 2024-04-09 DIAGNOSIS — M51369 Other intervertebral disc degeneration, lumbar region without mention of lumbar back pain or lower extremity pain: Secondary | ICD-10-CM | POA: Insufficient documentation

## 2024-04-09 DIAGNOSIS — M545 Low back pain, unspecified: Secondary | ICD-10-CM

## 2024-04-09 DIAGNOSIS — M5136 Other intervertebral disc degeneration, lumbar region with discogenic back pain only: Secondary | ICD-10-CM | POA: Diagnosis not present

## 2024-04-09 MED ORDER — CYCLOBENZAPRINE HCL 5 MG PO TABS: 5.0000 mg | ORAL_TABLET | Freq: Three times a day (TID) | ORAL | 1 refills | Status: AC | PRN

## 2024-04-09 MED ORDER — KETOROLAC TROMETHAMINE 60 MG/2ML IM SOLN
30.0000 mg | Freq: Once | INTRAMUSCULAR | Status: AC
  Administered 2024-04-09: 30 mg via INTRAMUSCULAR

## 2024-04-09 NOTE — Patient Instructions (Addendum)
 Toradol  30 mg injection- can take ibuprofen  after 24 hours but can take tylenol  today  Can go ahead and schedule physical therapy at the desk   If you are not making significant progress within 2-3 weeks lets refer to sports medicine or get you back into orthopedic or I can image as well  Recommended follow up: Return for as needed for new, worsening, persistent symptoms.

## 2024-04-09 NOTE — Progress Notes (Signed)
 Phone [phone] In person visit   Subjective:   Amber Elliott is a 48 y.o. year old very pleasant female patient who presents for/with See problem oriented charting Chief Complaint  Patient presents with   Back Pain    Pt c/o lower back pain, present for a couple of weeks and worsening. Has tried Naproxen , tizanidine, cyclobenzaprine  and tylenol .    Past Medical History-  Patient Active Problem List   Diagnosis Date Noted   DDD (degenerative disc disease), lumbar 04/09/2024    Priority: Medium    IDA (iron  deficiency anemia) 09/29/2022    Priority: Medium    GAD (generalized anxiety disorder) 12/27/2021    Priority: Medium    Panic attacks 12/27/2021    Priority: Medium    Major depression in full remission 11/12/2021    Priority: Medium    Attention deficit disorder (ADD) in adult 06/09/2014    Priority: Medium    Asthma 02/02/2007    Priority: Medium    Former smoker 10/06/2014    Priority: Low   Allergic rhinitis 02/02/2007    Priority: Low   Post-operative state 12/11/2023    Medications- reviewed and updated Current Outpatient Medications  Medication Sig Dispense Refill   albuterol  (VENTOLIN  HFA) 108 (90 Base) MCG/ACT inhaler INHALE 2 PUFFS BY MOUTH EVERY 6 HOURS AS NEEDED (Patient taking differently: Inhale 2 puffs into the lungs every 6 (six) hours as needed for wheezing or shortness of breath.) 8.5 g 1   BREO ELLIPTA  200-25 MCG/ACT AEPB Inhale 1 puff into the lungs daily. (Patient taking differently: Inhale 1 puff into the lungs daily.) 1 each 11   Brimonidine Tartrate (LUMIFY) 0.025 % SOLN Apply to eye as needed.     calcium carbonate (TUMS - DOSED IN MG ELEMENTAL CALCIUM) 500 MG chewable tablet Chew 1 tablet by mouth as needed for indigestion or heartburn.     cyclobenzaprine  (FLEXERIL ) 5 MG tablet Take 1-2 tablets (5-10 mg total) by mouth 3 (three) times daily as needed for muscle spasms (dont drive for 8 hours after taking). 30 tablet 1   estradiol   (VIVELLE -DOT) 0.0375 MG/24HR Place 1 patch onto the skin 2 (two) times a week.     hydrOXYzine  (ATARAX ) 25 MG tablet Take 1 tablet (25 mg total) by mouth 3 (three) times daily as needed. (Patient taking differently: Take 25 mg by mouth 3 (three) times daily as needed for anxiety.) 90 tablet 3   LORazepam  (ATIVAN ) 0.5 MG tablet TAKE 1 TABLET(0.5 MG) BY MOUTH TWICE DAILY AS NEEDED FOR ANXIETY. DO NOT drive FOR 8 HOURS AFTER TAKING. BACKUP IF HYDROXYZINE  NOT EFFECTIVE (Patient taking differently: Take 0.5 mg by mouth 2 (two) times daily as needed for anxiety. TAKE 1 TABLET(0.5 MG) BY MOUTH TWICE DAILY AS NEEDED FOR ANXIETY. DO NOT drive FOR 8 HOURS AFTER TAKING. BACKUP IF HYDROXYZINE  NOT EFFECTIVE) 15 tablet 1   tiZANidine (ZANAFLEX) 4 MG capsule Take 4 mg by mouth every 6 (six) hours.     No current facility-administered medications for this visit.     Objective:  BP 136/72 (BP Location: Left Arm, Patient Position: Sitting, Cuff Size: Large)   Pulse 79   Temp 98.1 F (36.7 C) (Temporal)   Ht 5' 1 (1.549 m)   Wt 202 lb 8 oz (91.9 kg)   LMP 10/01/2023 (Approximate)   SpO2 98%   BMI 38.26 kg/m  Gen: standing uncomfortably throughout the visit CV: RRR no murmurs rubs or gallops Lungs: CTAB no crackles, wheeze, rhonchi  Ext: no edema Skin: warm, dry Back - Spine with normal alignment and no deformity.  No tenderness to vertebral process palpation.  Paraspinous muscles are not tender and without spasm.   Range of motion is limited by pain.  Negative Straight leg raise.  Neuro- no saddle anesthesia, 5/5 strength lower extremities     Assessment and Plan   #degenerative disk disease with bilateral low Back Pain flare S:a few weeks of symptom(s) . Constant through the day. Moderate to severe pain-pain can wake her from sleep- but rare  Pain rating, quality, Location-today 7/10 and that's the peak it has been. Locatoin is bilateral low back - not midline Started, duration-Worse with  sitting-better with getting down on floor on her back Relieved by-tizanidine and cyclobenzaprine - neither really helps . TENS unit. Tylenol  and advil - mild shor tterm relief with each Previous imaging-was told degenerative disk disease by orthopedic a few years ago- Amber Elliott. Prior episode similar to this was more severe in march and did ultimately have hysterectomy. At times in past has been immobile but at least able to get around now.   ROS-No saddle anesthesia, bladder incontinence, fecal incontinence, weakness in extremity, numbness or tingling in extremity. History negative for trauma, history of cancer, fever, chills, unintentional weight loss, recent bacterial infection- over 2 weeks ago cellulitis, recent IV drug use, HIV, pain worse at night or while supine.  no blood in urine. No urinary symptoms  A/P: This appears to be a degenerative disc disease flare also with significant pain in paraspinous muscles.  Bilateral low back pain for several weeks with no red flags other than occasionally waking from sleep but possible this is from position change and flare- otherwise not waking her -we are trialing Toradol  injection 30 mg- had good response in march to this and tolerated -we are going to trial physical therapy- ordered today but she has to wait on call -  If you are not making significant progress within 2-3 weeks lets refer to sports medicine or get you back into orthopedic or I can image as well. We want to know sooner If worsening - no urinary symptom(s) and has had hysterectomy and no CVA tenderness-she opted out of urine without blood work was likely low yield  Recommended follow up: Return for as needed for new, worsening, persistent symptoms. Future Appointments  Date Time Provider Department Center  04/30/2024  2:20 PM Amber Garnette KIDD, MD [facility] Amber Elliott    Lab/Order associations:   ICD-10-CM   1. Degeneration of intervertebral disc of lumbar region with discogenic  back pain  M51.360     2. Acute bilateral low back pain without sciatica  M54.50 Ambulatory referral to Physical Therapy    ketorolac  (TORADOL ) injection 30 mg      Meds ordered this encounter  Medications   ketorolac  (TORADOL ) injection 30 mg   cyclobenzaprine  (FLEXERIL ) 5 MG tablet    Sig: Take 1-2 tablets (5-10 mg total) by mouth 3 (three) times daily as needed for muscle spasms (dont drive for 8 hours after taking).    Dispense:  30 tablet    Refill:  1    Return precautions advised.  Garnette Katrinka, MD

## 2024-04-30 ENCOUNTER — Encounter: Payer: Self-pay | Admitting: Family Medicine

## 2024-04-30 ENCOUNTER — Ambulatory Visit: Admitting: Family Medicine

## 2024-04-30 VITALS — BP 118/68 | HR 97 | Temp 97.9°F | Ht 61.0 in | Wt 201.4 lb

## 2024-04-30 DIAGNOSIS — J4520 Mild intermittent asthma, uncomplicated: Secondary | ICD-10-CM

## 2024-04-30 DIAGNOSIS — F325 Major depressive disorder, single episode, in full remission: Secondary | ICD-10-CM

## 2024-04-30 DIAGNOSIS — M5136 Other intervertebral disc degeneration, lumbar region with discogenic back pain only: Secondary | ICD-10-CM

## 2024-04-30 DIAGNOSIS — Z23 Encounter for immunization: Secondary | ICD-10-CM

## 2024-04-30 DIAGNOSIS — F411 Generalized anxiety disorder: Secondary | ICD-10-CM

## 2024-04-30 DIAGNOSIS — D509 Iron deficiency anemia, unspecified: Secondary | ICD-10-CM

## 2024-04-30 MED ORDER — ZEPBOUND 2.5 MG/0.5ML ~~LOC~~ SOAJ: 2.5000 mg | SUBCUTANEOUS | 5 refills | Status: AC

## 2024-04-30 NOTE — Patient Instructions (Addendum)
 may be mild arthritic changes- tender on exam- she can trial Voltaren gel 4 times a day for 2 weeks and try to give it relative rest   Flu shot today  [place]as at physical   Trial Zepbound  if covered- I'm so sorry if not- if it is covered and weight loss stagnates for am onth let me know would go to 5 mg weekly -recommended adequate protein intake and doing some weight lifting to not lose muscle mass  Recommended follow up: Return in about 6 months (around 10/29/2024) for physical or sooner if needed.Schedule b4 you leave.

## 2024-04-30 NOTE — Progress Notes (Signed)
 Phone [phone] In person visit   Subjective:   Amber Elliott is a 48 y.o. year old very pleasant female patient who presents for/with See problem oriented charting Chief Complaint  Patient presents with   Medical Management of Chronic Issues    6 month follow up; having right thumb pain; pt will be getting flu and covid done at pharmacy;     Past Medical History-  Patient Active Problem List   Diagnosis Date Noted   Morbid obesity (HCC) 04/30/2024    Priority: High   DDD (degenerative disc disease), lumbar 04/09/2024    Priority: Medium    IDA (iron  deficiency anemia) 09/29/2022    Priority: Medium    GAD (generalized anxiety disorder) 12/27/2021    Priority: Medium    Panic attacks 12/27/2021    Priority: Medium    Major depression in full remission 11/12/2021    Priority: Medium    Attention deficit disorder (ADD) in adult 06/09/2014    Priority: Medium    Asthma 02/02/2007    Priority: Medium    Former smoker 10/06/2014    Priority: Low   Allergic rhinitis 02/02/2007    Priority: Low    Medications- reviewed and updated Current Outpatient Medications  Medication Sig Dispense Refill   albuterol  (VENTOLIN  HFA) 108 (90 Base) MCG/ACT inhaler INHALE 2 PUFFS BY MOUTH EVERY 6 HOURS AS NEEDED (Patient taking differently: Inhale 2 puffs into the lungs every 6 (six) hours as needed for wheezing or shortness of breath.) 8.5 g 1   BREO ELLIPTA  200-25 MCG/ACT AEPB Inhale 1 puff into the lungs daily. (Patient taking differently: Inhale 1 puff into the lungs daily.) 1 each 11   Brimonidine Tartrate (LUMIFY) 0.025 % SOLN Apply to eye as needed.     calcium carbonate (TUMS - DOSED IN MG ELEMENTAL CALCIUM) 500 MG chewable tablet Chew 1 tablet by mouth as needed for indigestion or heartburn.     cyclobenzaprine  (FLEXERIL ) 5 MG tablet Take 1-2 tablets (5-10 mg total) by mouth 3 (three) times daily as needed for muscle spasms (dont drive for 8 hours after taking). 30 tablet 1    estradiol  (VIVELLE -DOT) 0.0375 MG/24HR Place 1 patch onto the skin 2 (two) times a week.     hydrOXYzine  (ATARAX ) 25 MG tablet Take 1 tablet (25 mg total) by mouth 3 (three) times daily as needed. (Patient taking differently: Take 25 mg by mouth 3 (three) times daily as needed for anxiety.) 90 tablet 3   LORazepam  (ATIVAN ) 0.5 MG tablet TAKE 1 TABLET(0.5 MG) BY MOUTH TWICE DAILY AS NEEDED FOR ANXIETY. DO NOT drive FOR 8 HOURS AFTER TAKING. BACKUP IF HYDROXYZINE  NOT EFFECTIVE (Patient taking differently: Take 0.5 mg by mouth 2 (two) times daily as needed for anxiety. TAKE 1 TABLET(0.5 MG) BY MOUTH TWICE DAILY AS NEEDED FOR ANXIETY. DO NOT drive FOR 8 HOURS AFTER TAKING. BACKUP IF HYDROXYZINE  NOT EFFECTIVE) 15 tablet 1   oxyCODONE  (OXY IR/ROXICODONE ) 5 MG immediate release tablet Take 5 mg by mouth every 4 (four) hours as needed for severe pain (pain score 7-10).     progesterone  (PROMETRIUM ) 100 MG capsule Take 100 mg by mouth at bedtime.     tirzepatide  (ZEPBOUND ) 2.5 MG/0.5ML Pen Inject 2.5 mg into the skin once a week. 2 mL 5   tiZANidine (ZANAFLEX) 4 MG capsule Take 4 mg by mouth every 6 (six) hours.     No current facility-administered medications for this visit.     Objective:  BP 118/68 (  BP Location: Left Arm, Patient Position: Sitting, Cuff Size: Normal)   Pulse 97   Temp 97.9 F (36.6 C) (Temporal)   Ht 5' 1 (1.549 m)   Wt 201 lb 6.4 oz (91.4 kg)   LMP 10/01/2023   SpO2 96%   BMI 38.05 kg/m  Gen: NAD, resting comfortably CV: RRR no murmurs rubs or gallops Lungs: CTAB no crackles, wheeze, rhonchi Ext: no edema Skin: warm, dry     Assessment and Plan   # Morbid Obesity due to BMI over 35 with degenerative disk disease  S:thankfully  weight loss starting in 2023 appears to have stabilized and mildly improved. A lot of stressors during this time frame. Most recently she has tried regular walking, 3 reasonably healthy meals per day, improved dietary intake and has been  intentional about therapy.  - has also tried nutrisystem and weight watchers in the past with limited success.  -no known sleep apnea Wt Readings from Last 3 Encounters:  04/30/24 201 lb 6.4 oz (91.4 kg)  04/09/24 202 lb 8 oz (91.9 kg)  03/16/24 205 lb (93 kg)  A/P: morbid obesity noted with failed conservative measures.  -recommended adequate protein intake and doing some weight lifting to not lose muscle mass  #social update- [place] trip to [place] around the holidays! Looking forward to this. Flu before disney planning on but may wait on COVID until 6 months out as had COVID in September   # Degenerative disc disease S: Concern at last visit for degenerative disc disease flare but also had significant spasm in paraspinous muscles.  We did a Toradol  injection and gave her a referral to physical therapy and discussed sports medicine orthopedic visit if no significant improvement A/P: gradually improved with time- feeling better with time     # Right thumb pain S:right handed. Started just a couple weeks ago  A/P: may be mild arthritic changes- tender on exam- she can trial Voltaren gel 4 times a day for 2 weeks and try to give it relative rest    # Depression and history anxiety and panic attacks S: Medication:hydroxyzine  50 mg mainly at night but not needing lately with lorazepam  as backup for more intense anxiety- 15 pills every 3 month -still working with therapist -off Prozac  early 2025. 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 Prior Wellbutrin  in 2023    04/30/2024    2:23 PM 04/09/2024    4:01 PM 10/30/2023    8:51 AM  Depression screen PHQ 2/9  Decreased Interest 0 0 0  Down, Depressed, Hopeless 0 0 0  PHQ - 2 Score 0 0 0  Altered sleeping 0 0 0  Tired, decreased energy 0 0 0  Change in appetite 0 0 0  Feeling bad or failure about yourself  0 0 0  Trouble concentrating 0 0 0  Moving slowly  or fidgety/restless 0 0 0  Suicidal thoughts 0 0 0  PHQ-9 Score 0 0 0   Difficult doing work/chores Not difficult at all Not difficult at all Not difficult at all     Data saved with a previous flowsheet row definition       04/30/2024    2:23 PM 03/03/2023    8:36 AM 12/26/2022    1:18 PM 11/14/2022    4:09 PM  GAD 7 : Generalized Anxiety Score  Nervous, Anxious, on Edge 0 0 1 3  Control/stop worrying 0 0  1 3  Worry too much - different things 0 0 0 3  Trouble relaxing 0 0 0 3  Restless 0 0 0 3  Easily annoyed or irritable 0 0 1 1  Afraid - awful might happen 0 0 0 3  Total GAD 7 Score 0 0 3 19  Anxiety Difficulty Not difficult at all Not difficult at all Not difficult at all Somewhat difficult  A/P: phq9 and generalized anxiety disorder7 well controlled full remission continue current medications   # attention deficit disorder- untreated but not tolerating  # iron  deficiency anemia  - required iron  infusions may and June 2021 with Dr. Odean and again in 2024 x 2. Has don well lately and CBC normal in November after hysterectomy   # Asthma S: Maintenance Medication: breo ellipta  200-25 mcg daily (prior advair not covered) through [facility]  As needed medication: albuterol - once a month.  A/P: well controlled continue current medications    #hormone replacement therapy - on estradiol  plus progesterone  through gynecology -hysterectomy 12/03/2023  Recommended follow up: Return in about 6 months (around 10/29/2024) for physical or sooner if needed.Schedule b4 you leave.  Lab/Order associations:   ICD-10-CM   1. Mild intermittent asthma without complication  J45.20     2. Major depressive disorder with single episode, in full remission  F32.5     3. GAD (generalized anxiety disorder)  F41.1     4. Iron  deficiency anemia, unspecified iron  deficiency anemia type  D50.9     5. Degeneration of intervertebral disc of lumbar region with discogenic back pain  M51.360     6. Immunization  due  Z23 Flu vaccine trivalent PF, 6mos and older(Flulaval,Afluria,Fluarix,Fluzone)    7. Morbid obesity (HCC)  E66.01       Meds ordered this encounter  Medications   tirzepatide  (ZEPBOUND ) 2.5 MG/0.5ML Pen    Sig: Inject 2.5 mg into the skin once a week.    Dispense:  2 mL    Refill:  5    E66.01    Return precautions advised.  Garnette Lukes, MD

## 2024-10-31 ENCOUNTER — Encounter: Admitting: Family Medicine
